# Patient Record
Sex: Female | Born: 1935
Health system: Southern US, Community
[De-identification: ages and names within clinical notes are randomized; demographics above are authoritative.]

## PROBLEM LIST (undated history)

## (undated) DIAGNOSIS — I10 Essential (primary) hypertension: Secondary | ICD-10-CM

## (undated) DIAGNOSIS — K589 Irritable bowel syndrome without diarrhea: Secondary | ICD-10-CM

## (undated) DIAGNOSIS — F419 Anxiety disorder, unspecified: Secondary | ICD-10-CM

## (undated) DIAGNOSIS — N816 Rectocele: Secondary | ICD-10-CM

## (undated) DIAGNOSIS — IMO0002 Reserved for concepts with insufficient information to code with codable children: Secondary | ICD-10-CM

## (undated) HISTORY — DX: Reserved for concepts with insufficient information to code with codable children: IMO0002

## (undated) HISTORY — PX: ABDOMINAL HYSTERECTOMY: SHX81

## (undated) HISTORY — PX: CHOLECYSTECTOMY: SHX55

## (undated) HISTORY — DX: Essential (primary) hypertension: I10

## (undated) HISTORY — DX: Anxiety disorder, unspecified: F41.9

## (undated) HISTORY — DX: Rectocele: N81.6

## (undated) HISTORY — DX: Irritable bowel syndrome, unspecified: K58.9

---

## 2006-02-15 ENCOUNTER — Ambulatory Visit: Payer: Self-pay | Admitting: Family Medicine

## 2006-05-07 ENCOUNTER — Ambulatory Visit: Payer: Self-pay | Admitting: Family Medicine

## 2006-05-07 DIAGNOSIS — I10 Essential (primary) hypertension: Secondary | ICD-10-CM | POA: Insufficient documentation

## 2006-05-07 DIAGNOSIS — F411 Generalized anxiety disorder: Secondary | ICD-10-CM | POA: Insufficient documentation

## 2006-07-12 ENCOUNTER — Ambulatory Visit: Payer: Self-pay | Admitting: Family Medicine

## 2006-07-12 DIAGNOSIS — K219 Gastro-esophageal reflux disease without esophagitis: Secondary | ICD-10-CM | POA: Insufficient documentation

## 2006-08-07 ENCOUNTER — Telehealth: Payer: Self-pay | Admitting: Family Medicine

## 2006-09-04 ENCOUNTER — Ambulatory Visit: Payer: Self-pay | Admitting: Family Medicine

## 2006-09-04 DIAGNOSIS — H8110 Benign paroxysmal vertigo, unspecified ear: Secondary | ICD-10-CM | POA: Insufficient documentation

## 2006-09-05 ENCOUNTER — Telehealth (INDEPENDENT_AMBULATORY_CARE_PROVIDER_SITE_OTHER): Payer: Self-pay | Admitting: *Deleted

## 2006-09-09 ENCOUNTER — Telehealth: Payer: Self-pay | Admitting: Family Medicine

## 2007-01-14 ENCOUNTER — Ambulatory Visit: Payer: Self-pay | Admitting: Family Medicine

## 2007-01-14 DIAGNOSIS — M25519 Pain in unspecified shoulder: Secondary | ICD-10-CM | POA: Insufficient documentation

## 2007-01-14 DIAGNOSIS — M810 Age-related osteoporosis without current pathological fracture: Secondary | ICD-10-CM | POA: Insufficient documentation

## 2007-01-27 ENCOUNTER — Encounter: Admission: RE | Admit: 2007-01-27 | Discharge: 2007-01-27 | Payer: Self-pay | Admitting: Family Medicine

## 2007-02-06 ENCOUNTER — Ambulatory Visit: Payer: Self-pay | Admitting: Family Medicine

## 2007-04-07 ENCOUNTER — Encounter (INDEPENDENT_AMBULATORY_CARE_PROVIDER_SITE_OTHER): Payer: Self-pay | Admitting: *Deleted

## 2007-05-06 ENCOUNTER — Ambulatory Visit: Payer: Self-pay | Admitting: Family Medicine

## 2007-05-23 ENCOUNTER — Ambulatory Visit: Payer: Self-pay | Admitting: Family Medicine

## 2007-05-27 ENCOUNTER — Ambulatory Visit: Payer: Self-pay | Admitting: Family Medicine

## 2007-08-08 ENCOUNTER — Encounter: Payer: Self-pay | Admitting: Family Medicine

## 2007-10-02 ENCOUNTER — Ambulatory Visit: Payer: Self-pay | Admitting: Family Medicine

## 2007-10-02 DIAGNOSIS — R300 Dysuria: Secondary | ICD-10-CM | POA: Insufficient documentation

## 2007-10-02 LAB — CONVERTED CEMR LAB
Nitrite: NEGATIVE
Protein, U semiquant: NEGATIVE

## 2007-10-23 ENCOUNTER — Telehealth: Payer: Self-pay | Admitting: Family Medicine

## 2008-02-12 ENCOUNTER — Telehealth: Payer: Self-pay | Admitting: Family Medicine

## 2008-02-12 ENCOUNTER — Ambulatory Visit: Payer: Self-pay | Admitting: Family Medicine

## 2008-02-12 ENCOUNTER — Encounter: Admission: RE | Admit: 2008-02-12 | Discharge: 2008-02-12 | Payer: Self-pay | Admitting: Family Medicine

## 2008-03-15 ENCOUNTER — Ambulatory Visit (HOSPITAL_COMMUNITY): Payer: Self-pay | Admitting: Psychiatry

## 2008-03-22 ENCOUNTER — Ambulatory Visit (HOSPITAL_COMMUNITY): Payer: Self-pay | Admitting: Psychiatry

## 2008-03-30 ENCOUNTER — Ambulatory Visit (HOSPITAL_COMMUNITY): Payer: Self-pay | Admitting: Psychiatry

## 2008-04-09 ENCOUNTER — Ambulatory Visit (HOSPITAL_COMMUNITY): Payer: Self-pay | Admitting: Psychiatry

## 2008-04-20 ENCOUNTER — Ambulatory Visit (HOSPITAL_COMMUNITY): Payer: Self-pay | Admitting: Psychiatry

## 2008-04-27 ENCOUNTER — Ambulatory Visit (HOSPITAL_COMMUNITY): Payer: Self-pay | Admitting: Psychiatry

## 2008-05-04 ENCOUNTER — Ambulatory Visit (HOSPITAL_COMMUNITY): Payer: Self-pay | Admitting: Psychiatry

## 2008-05-25 ENCOUNTER — Ambulatory Visit: Payer: Self-pay | Admitting: Family Medicine

## 2008-05-30 ENCOUNTER — Emergency Department (HOSPITAL_BASED_OUTPATIENT_CLINIC_OR_DEPARTMENT_OTHER): Admission: EM | Admit: 2008-05-30 | Discharge: 2008-05-31 | Payer: Self-pay | Admitting: Emergency Medicine

## 2008-05-30 ENCOUNTER — Ambulatory Visit: Payer: Self-pay | Admitting: Diagnostic Radiology

## 2008-05-31 ENCOUNTER — Telehealth: Payer: Self-pay | Admitting: Family Medicine

## 2008-05-31 DIAGNOSIS — K863 Pseudocyst of pancreas: Secondary | ICD-10-CM

## 2008-05-31 DIAGNOSIS — K862 Cyst of pancreas: Secondary | ICD-10-CM | POA: Insufficient documentation

## 2008-06-02 ENCOUNTER — Ambulatory Visit: Payer: Self-pay | Admitting: Family Medicine

## 2008-06-08 ENCOUNTER — Ambulatory Visit (HOSPITAL_COMMUNITY): Payer: Self-pay | Admitting: Psychiatry

## 2008-06-15 ENCOUNTER — Ambulatory Visit (HOSPITAL_COMMUNITY): Payer: Self-pay | Admitting: Psychiatry

## 2008-06-29 ENCOUNTER — Ambulatory Visit (HOSPITAL_COMMUNITY): Payer: Self-pay | Admitting: Psychiatry

## 2008-06-30 ENCOUNTER — Ambulatory Visit: Payer: Self-pay | Admitting: Family Medicine

## 2008-06-30 DIAGNOSIS — K589 Irritable bowel syndrome without diarrhea: Secondary | ICD-10-CM | POA: Insufficient documentation

## 2008-09-30 ENCOUNTER — Telehealth (INDEPENDENT_AMBULATORY_CARE_PROVIDER_SITE_OTHER): Payer: Self-pay | Admitting: *Deleted

## 2008-10-01 ENCOUNTER — Ambulatory Visit: Payer: Self-pay | Admitting: Family Medicine

## 2008-10-01 DIAGNOSIS — J309 Allergic rhinitis, unspecified: Secondary | ICD-10-CM | POA: Insufficient documentation

## 2008-12-30 ENCOUNTER — Telehealth: Payer: Self-pay | Admitting: Family Medicine

## 2009-02-15 ENCOUNTER — Ambulatory Visit: Payer: Self-pay | Admitting: Family Medicine

## 2009-03-31 ENCOUNTER — Ambulatory Visit: Payer: Self-pay | Admitting: Family Medicine

## 2009-05-16 ENCOUNTER — Ambulatory Visit: Payer: Self-pay | Admitting: Family Medicine

## 2009-05-18 ENCOUNTER — Ambulatory Visit: Payer: Self-pay | Admitting: Family Medicine

## 2009-07-01 ENCOUNTER — Telehealth: Payer: Self-pay | Admitting: Family Medicine

## 2009-07-26 ENCOUNTER — Ambulatory Visit: Payer: Self-pay | Admitting: Family Medicine

## 2009-08-17 ENCOUNTER — Encounter: Payer: Self-pay | Admitting: Family Medicine

## 2009-11-23 ENCOUNTER — Telehealth: Payer: Self-pay | Admitting: Family Medicine

## 2009-12-20 ENCOUNTER — Telehealth: Payer: Self-pay | Admitting: Family Medicine

## 2010-01-10 ENCOUNTER — Ambulatory Visit: Payer: Self-pay | Admitting: Family Medicine

## 2010-01-24 ENCOUNTER — Telehealth: Payer: Self-pay | Admitting: Family Medicine

## 2010-05-04 ENCOUNTER — Ambulatory Visit: Payer: Self-pay | Admitting: Family Medicine

## 2010-05-04 ENCOUNTER — Encounter: Admission: RE | Admit: 2010-05-04 | Discharge: 2010-05-04 | Payer: Self-pay | Admitting: Family Medicine

## 2010-05-04 DIAGNOSIS — M5430 Sciatica, unspecified side: Secondary | ICD-10-CM | POA: Insufficient documentation

## 2010-05-16 ENCOUNTER — Encounter: Payer: Self-pay | Admitting: Family Medicine

## 2010-05-18 ENCOUNTER — Telehealth: Payer: Self-pay | Admitting: Family Medicine

## 2010-05-19 ENCOUNTER — Ambulatory Visit: Payer: Self-pay | Admitting: Family Medicine

## 2010-05-22 ENCOUNTER — Encounter
Admission: RE | Admit: 2010-05-22 | Discharge: 2010-06-15 | Payer: Self-pay | Source: Home / Self Care | Attending: Family Medicine | Admitting: Family Medicine

## 2010-06-23 ENCOUNTER — Ambulatory Visit
Admission: RE | Admit: 2010-06-23 | Discharge: 2010-06-23 | Payer: Self-pay | Source: Home / Self Care | Attending: Family Medicine | Admitting: Family Medicine

## 2010-06-23 ENCOUNTER — Encounter: Payer: Self-pay | Admitting: Family Medicine

## 2010-06-24 LAB — CONVERTED CEMR LAB
ALT: 14 units/L (ref 0–35)
Albumin: 4.5 g/dL (ref 3.5–5.2)
BUN: 19 mg/dL (ref 6–23)
CO2: 25 meq/L (ref 19–32)
Calcium: 9.2 mg/dL (ref 8.4–10.5)
Chloride: 106 meq/L (ref 96–112)
Creatinine, Ser: 1.15 mg/dL (ref 0.40–1.20)
Potassium: 4.8 meq/L (ref 3.5–5.3)

## 2010-07-18 NOTE — Assessment & Plan Note (Signed)
Summary: KNEE PAIN/LINGERING COUGH   Vital Signs:  Patient profile:   77 year old female Height:      68 inches Weight:      164.75 pounds BMI:     25.14 Temp:     98.5 degrees F oral Pulse rate:   81 / minute BP sitting:   185 / 92  Vitals Entered By: Kandice Hams (July 26, 2009 10:52 AM) CC: C/O RIGHT KNEE PAIN, KNOT.   WANT DOG BITE CHECKED,   Primary Care Provider:  [name] MD  CC:  C/O RIGHT KNEE PAIN, KNOT.   WANT DOG BITE CHECKED, and .  History of Present Illness: C/O RIGHT KNEE PAIN, KNOT.   WANT DOG BITE CHECKED.  Says feels a hard tissue under teh skin.Still a little tender but no drainage or pain currently.    Right knee painful for one week . Thinks it was swollen. It is acutally a little better today. Not taking any pain relievers or using any creams for it.  Will occ use IBU for her joints.  Helps some. No trauma. Was very painful to walk the first couple of days. Better wtih rest. Has been alble to walk well on it last 2 days.  Feels the swelling has improved as well.   BP at home this AM.  136/69.  Still hav occ cough s/p URI.  No SOB or wheezing.  No fever.  Started after cold. Mostly dry cough occ productive.     Wants to know if can exercise.    Allergies: No Known Drug Allergies  Social History: Reviewed history from 06/02/2008 and no changes required. 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 Lives with son Gelene Mink.  Has 3 adult children.  Never smoked, no EtOH, no drugs, no Caff, no reg exercise.  Physical Exam  General:  Well-developed,well-nourished,in no acute distress; alert,appropriate and cooperative throughout examination Head:  Normocephalic and atraumatic without obvious abnormalities. No apparent alopecia or balding. Eyes:  No corneal or conjunctival inflammation noted. EOMI. Perrla. Ears:  External ear exam shows no significant lesions or deformities.  Otoscopic examination reveals clear canals, tympanic membranes are intact  bilaterally without bulging, retraction, inflammation or discharge. Hearing is grossly normal bilaterally. Nose:  External nasal examination shows no deformity or inflammation. Nasal mucosa are pink and moist without lesions or exudates. Mouth:  Oral mucosa and oropharynx without lesions or exudates.  Teeth in good repair. Neck:  No deformities, masses, or tenderness noted. Lungs:  Normal respiratory effort, chest expands symmetrically. Lungs are clear to auscultation, no crackles or wheezes. Heart:  Normal rate and regular rhythm. S1 and S2 normal without gallop, murmur, click, rub or other extra sounds. Msk:  Right knee with trace edema. No crepitus. Normal ROM. no laxity.  Nontender. No bruising or trauma.  Neg McMurrays.  KNee and andles strength 5/5. No LE edema.  Extremities:  On her lower right leg the dog bite is healing well. still some scab and firm tissue which is likely scar tissue.    Impression & Recommendations:  Problem # 1:  KNEE PAIN, RIGHT (ICD-719.46) Assessment New Discussed that her exam is normal excpet for some mild swelling. With no trauma likely related to her OA. Tylenol arthritis for pain. Call if not continuing to improve ot gets worse and at that time will order xray. Elevated knee if swells.    Problem # 2:  DOG BITE (ICD-E906.0) Assessment: Improved Healing well. Some scar tissue.   Problem # 3:  COUGH (ICD-786.2) Assessment: New Exam is normal today. nO signs of infection. Likely post viral cough. If not better in 2-3 weeks will get a CXR.   Complete Medication List: 1)  Alprazolam 0.25 Mg Tabs (Alprazolam) .... One by mouth one- two times daily as needed for anxiety attacks 2)  Nexium 40 Mg Cpdr (Esomeprazole magnesium) .... Take 1 tablet by mouth once a day 20 min before breakfast 3)  Proventil Hfa 108 (90 Base) Mcg/act Aers (Albuterol sulfate) .... 4 puffs inhaled every 4 hours as needed for shortness of breath 4)  Toprol Xl 25 Mg Xr24h-tab (Metoprolol  succinate) .... Take 1 tablet by mouth once a day

## 2010-07-18 NOTE — Progress Notes (Signed)
Summary: Refill Xanax  Phone Note Call from Patient Call back at Home Phone [phone]   Caller: Patient Call For: [name] MD Summary of Call: Pt calls and wanted to know if could get a refill on her Xanax- had to take 2 a day with the dog bite and brother died during Christmas- normally she says she just takes one a day. Uses [facility] [address] Initial call taken by: Kathlene November,  July 01, 2009 8:16 AM    Prescriptions: ALPRAZOLAM 0.25 MG TABS (ALPRAZOLAM) one by mouth one- two times daily as needed for anxiety attacks  #45 x 1   Entered and Authorized by:   [name] MD   Signed by:   [name] MD on 07/01/2009   Method used:   Printed then faxed to ...       [facility] [address] (retail)       [address]       [place], [place]  [address]       Ph: [phone] or [phone]       Fax: [phone]   RxID:   [id number]

## 2010-07-18 NOTE — Assessment & Plan Note (Signed)
Summary: Sciatic, HTN   Vital Signs:  Patient profile:   77 year old female Height:      68 inches Weight:      166 pounds Pulse rate:   93 / minute BP sitting:   166 / 84  (right arm) Cuff size:   regular  Vitals Entered By: Avon Gully CMA, Duncan Dull) (May 04, 2010 9:06 AM) CC: pain in rt leg x 2 weeks, pain radiated to buttocks into lower back only on rt side   Primary Care Provider:  [name] MD  CC:  pain in rt leg x 2 weeks and pain radiated to buttocks into lower back only on rt side.  History of Present Illness: Burning sensation started on her right outer lower leg. Started about 2-3 weeks ago. Says would feel like it was hot. No redness or swelling. Then migrated up to her outer thigh. Now in her buttock and low back.  Occ uses a cane. Says the pain was so severe for a couple of days she was almost in tears. Has been a little better last couple of days. Did try Advil 2 tabs two times a day. then tried Tylenol arthritis and felt this took the edge off.  She has to travel next week and is worrried about this.   Current Medications (verified): 1)  Alprazolam 0.25 Mg Tabs (Alprazolam) .... One By Mouth One- Two Times Daily As Needed For Anxiety Attacks 2)  Nexium 40 Mg  Cpdr (Esomeprazole Magnesium) .... Take 1 Tablet By Mouth Once A Day 20 Min Before Breakfast  Allergies (verified): No Known Drug Allergies  Comments:  Nurse/Medical Assistant: The patient's medications and allergies were reviewed with the patient and were updated in the Medication and Allergy Lists. Avon Gully CMA, Duncan Dull) (May 04, 2010 9:07 AM)  Physical Exam  General:  Well-developed,well-nourished,in no acute distress; alert,appropriate and cooperative throughout examination Lungs:  Normal respiratory effort, chest expands symmetrically. Lungs are clear to auscultation, no crackles or wheezes. Heart:  Normal rate and regular rhythm. S1 and S2 normal without gallop, murmur,  click, rub or other extra sounds. Msk:  Righ hip with NROM but pain in her low back with full flexion. Hip abduction strength is normal.  Tender over the buttic area and the right great trochanger. No spine tenderness. Neg straight leg raise. Hip, knee, and ankle strength 5/5 bilat.  Pulses:  Radial 2+  Extremities:  NO LE edema.    Impression & Recommendations:  Problem # 1:  SCIATICA (ICD-724.3) BAsed on teh locatoin of her pain I think it is sciatica. It is interested that it startedin her lower leg and progessed up.  Will get an xray because of her age.   Conservative tx. Tylenol, streteches, rest, moist heat. Call if not better in 2 weeks.  Orders: T-DG Lumbar Spine 2-3 Views ([address]) T-DG Sacrum/Coccyx ([address])  Her updated medication list for this problem includes:    Tramadol Hcl 50 Mg Tabs (Tramadol hcl) .Marland Kitchen... Take 1 tablet by mouth three times a day as needed severe pain.  Problem # 2:  HYPERTENSION, WHITE COAT (ICD-796.2) Assessment: Deteriorated Restart a BP med. will avoid a diuretic.  F/U in 2 montsh to recheck. Can check Cr at that time.  Her updated medication list for this problem includes:    Losartan Potassium 50 Mg Tabs (Losartan potassium) .Marland Kitchen... Take 1 tablet by mouth once a day  BP today: 166/84 Prior BP: 168/80 (01/10/2010)  Instructed in low sodium diet (DASH  Handout) and behavior modification.    Complete Medication List: 1)  Alprazolam 0.25 Mg Tabs (Alprazolam) .... One by mouth one- two times daily as needed for anxiety attacks 2)  Nexium 40 Mg Cpdr (Esomeprazole magnesium) .... Take 1 tablet by mouth once a day 20 min before breakfast 3)  Tramadol Hcl 50 Mg Tabs (Tramadol hcl) .... Take 1 tablet by mouth three times a day as needed severe pain. 4)  Losartan Potassium 50 Mg Tabs (Losartan potassium) .... Take 1 tablet by mouth once a day  Patient Instructions: 1)  I think this is from your sciatic nerve.   2)  Can start the stretches when able.  3)  Use  your tylenol arthritis 4)  Can use the tramadol as needed as well for more severe pain. 5)  WE will call you with the xray results.  Prescriptions: ALPRAZOLAM 0.25 MG TABS (ALPRAZOLAM) one by mouth one- two times daily as needed for anxiety attacks  #60 x 1   Entered and Authorized by:   [name] MD   Signed by:   [name] MD on 05/04/2010   Method used:   Printed then faxed to ...       [facility] [address] (retail)       [address]       [place], [place]  [address]       Ph: [phone] or [phone]       Fax: [phone]   RxID:   [id number] LOSARTAN POTASSIUM 50 MG TABS (LOSARTAN POTASSIUM) Take 1 tablet by mouth once a day  #30 x 2   Entered and Authorized by:   [name] MD   Signed by:   [name] MD on 05/04/2010   Method used:   Electronically to        [facility] [address] (retail)       [address]       [place], [place]  [address]       Ph: [phone] or [phone]       Fax: [phone]   RxID:   [id number] TRAMADOL HCL 50 MG TABS (TRAMADOL HCL) Take 1 tablet by mouth three times a day as needed severe pain.  #45 x 0   Entered and Authorized by:   [name] MD   Signed by:   [name] MD on 05/04/2010   Method used:   Electronically to        [facility] [address] (retail)       [address]       [place], [place]  [address]       Ph: [phone] or [phone]       Fax: [phone]   RxID:   [id number]    Orders Added: 1)  T-DG Lumbar Spine 2-3 Views [[address]2100] 2)  T-DG Sacrum/Coccyx [722[address]] 3)  Est. Patient Level IV [[address]]

## 2010-07-18 NOTE — Progress Notes (Signed)
Summary: dizziness  Phone Note Call from Patient   Caller: Patient Call For: [name] MD Summary of Call: FYI: pt had a sudden onset of dizzieness yesterday. Has a hx of vertigo. Took dramamine and doesnt feel as dizzy this am. Told her to to wait to see how she feel today, and if comes back or pt starts to feel worse then to call us back. pt voiced understanding Initial call taken by: Avon Gully CMA, Duncan Dull),  January 24, 2010 8:15 AM

## 2010-07-18 NOTE — Assessment & Plan Note (Signed)
Summary: cough and mouth sores   Vital Signs:  Patient profile:   77 year old female Height:      68 inches Weight:      162 pounds Pulse rate:   80 / minute BP sitting:   168 / 80  (left arm) Cuff size:   regular  Vitals Entered By: Avon Gully CMA, Duncan Dull) (January 10, 2010 11:40 AM) CC: cough,mouth sores x 16 days   Primary Care Provider:  [name] MD  CC:  cough and mouth sores x 16 days.  History of Present Illness: Cough for about 16 days. Has tried Robitussin and helped some Tried the Halls cough drops.  Went to [employer]. no SOB or chest symptoms. Feels like it is in her "throat".  2 days after stopped the cough drops got ulcers in her mouth.  Feels it is getting better. Gums were tender.  Tea would sooth the lesions.  Did try the Advil as well.  No nasal congestion. Some ear pressure. No fever or HA.     BP at home 129/69 this AM.  SHe has white coat hypertension.   Current Medications (verified): 1)  Alprazolam 0.25 Mg Tabs (Alprazolam) .... One By Mouth One- Two Times Daily As Needed For Anxiety Attacks 2)  Nexium 40 Mg  Cpdr (Esomeprazole Magnesium) .... Take 1 Tablet By Mouth Once A Day 20 Min Before Breakfast  Allergies (verified): No Known Drug Allergies  Comments:  Nurse/Medical Assistant: Avon Gully CMA, Duncan Dull) (January 10, 2010 11:41 AM) The patient's medications and allergies were reviewed with the patient and were updated in the Medication and Allergy Lists. Avon Gully CMA, Duncan Dull) (January 10, 2010 11:41 AM)  Physical Exam  General:  Well-developed,well-nourished,in no acute distress; alert,appropriate and cooperative throughout examination Head:  Normocephalic and atraumatic without obvious abnormalities. No apparent alopecia or balding. Eyes:  No corneal or conjunctival inflammation noted. EOMI.  Ears:  Bilat canals blocked with cerumen.  Nose:  no external deformity.   Mouth:  Oral mucosa and oropharynx without lesions or  exudates.  Teeth in good repair. No ulcerations onthe tongue toay.  Lungs:  Normal respiratory effort, chest expands symmetrically. Lungs are clear to auscultation, no crackles or wheezes. Heart:  Normal rate and regular rhythm. S1 and S2 normal without gallop, murmur, click, rub or other extra sounds. Skin:  no rashes.   Cervical Nodes:  No lymphadenopathy noted Psych:  Cognition and judgment appear intact. Alert and cooperative with normal attention span and concentration. No apparent delusions, illusions, hallucinations   Impression & Recommendations:  Problem # 1:  ALLERGIC RHINITIS CAUSE UNSPECIFIED (ICD-477.9) I think this is the cause of her ST, cough, and drainage. If not better in 5 days then let us now.   Problem # 2:  ANXIETY DISORDER, GENERALIZED (ICD-300.02) Overall doing well but takes one hal in the late AM adn then late PM adn then one in the evening and this really seems to work for her and control her sxs.  Increase tabs to 60 per month.  Her updated medication list for this problem includes:    Alprazolam 0.25 Mg Tabs (Alprazolam) ..... One by mouth one- two times daily as needed for anxiety attacks  Problem # 3:  HYPERTENSION, BENIGN ESSENTIAL (ICD-401.1)  The following medications were removed from the medication list:    Toprol Xl 25 Mg Xr24h-tab (Metoprolol succinate) .Marland Kitchen... Take 1 tablet by mouth once a day  Complete Medication List: 1)  Alprazolam 0.25 Mg Tabs (  Alprazolam) .... One by mouth one- two times daily as needed for anxiety attacks 2)  Nexium 40 Mg Cpdr (Esomeprazole magnesium) .... Take 1 tablet by mouth once a day 20 min before breakfast  Patient Instructions: 1)  Lodrane one a day. If not too drying then can increase to 2 tabs daily.  2)  Start the flonase 1 spray in each nostril once a day. 3)   If not better by the end of the week then let me know.  4)  I sent a new prescription to the pharmacy for your anxiety medication.   Prescriptions: ALPRAZOLAM 0.25 MG TABS (ALPRAZOLAM) one by mouth one- two times daily as needed for anxiety attacks  #60 x 1   Entered and Authorized by:   [name] MD   Signed by:   [name] MD on 01/10/2010   Method used:   Printed then faxed to ...       [facility] [address] (retail)       [address]       [place], [place]  [address]       Ph: [phone] or [phone]       Fax: [phone]   RxID:   [id number]

## 2010-07-18 NOTE — Progress Notes (Signed)
Summary: refill alprazolam  Phone Note Refill Request Message from:  Fax from Pharmacy on December 20, 2009 3:49 PM  Refills Requested: Medication #1:  ALPRAZOLAM 0.25 MG TABS one by mouth one- two times daily as needed for anxiety attacks   Last Refilled: 11/23/2009 [facility]     fax [phone]   Method Requested: Fax to Local Pharmacy Initial call taken by: Duard Brady LPN,  December 21, 4538 3:49 PM    Prescriptions: ALPRAZOLAM 0.25 MG TABS (ALPRAZOLAM) one by mouth one- two times daily as needed for anxiety attacks  #45 x 0   Entered and Authorized by:   Seymour Bars DO   Signed by:   Seymour Bars DO on 12/20/2009   Method used:   Printed then faxed to ...       [facility] [address] (retail)       [address]       [place], [place]  [address]       Ph: [phone] or [phone]       Fax: [phone]   RxID:   [id number]

## 2010-07-18 NOTE — Medication Information (Signed)
Summary: Denial for Alprazolam/[facility]  Denial for Alprazolam/[facility]   Imported By: Lanelle Bal 08/23/2009 08:53:06  _____________________________________________________________________  External Attachment:    Type:   Image     Comment:   External Document

## 2010-07-18 NOTE — Assessment & Plan Note (Signed)
Summary: hypertension, sciatica   Vital Signs:  Patient profile:   77 year old female Height:      68 inches Weight:      165 pounds Pulse rate:   67 / minute BP sitting:   185 / 85  (right arm) Cuff size:   regular  Vitals Entered By: Avon Gully CMA, Duncan Dull) (May 19, 2010 9:48 AM)  Serial Vital Signs/Assessments:  Time      Position  BP       Pulse  Resp  Temp     By 9:49 AM             179/92                         Avon Gully CMA, (AAMA)  CC: BP fluctuating   Primary Care Briyonna Omara:  [name] MD  CC:  BP fluctuating.  History of Present Illness: Home BPs have ben running in the upper 140s to the 180s. she does look her blood pressure is bouncing up and down.  She did bring her home blood pressure machine to compare our machine.  The second reading under the vital signs is the reading on her home blood pressure monitor today.  Only one reading in the 140s on the memory of her BP machine. No CP or SOB.    Still having pain in her right leg into her right low back. Overall better than it was 2-3 weeks ago but still bothering her. We have have referred her for PT. her appointment is on Monday  Allergies: No Known Drug Allergies  Physical Exam  General:  Well-developed,well-nourished,in no acute distress; alert,appropriate and cooperative throughout examination Head:  Normocephalic and atraumatic without obvious abnormalities. No apparent alopecia or balding. Lungs:  Normal respiratory effort, chest expands symmetrically. Lungs are clear to auscultation, no crackles or wheezes. Heart:  Normal rate and regular rhythm. S1 and S2 normal without gallop, murmur, click, rub or other extra sounds. Pulses:  Radial 2+ Extremities:   node lower extremity edema Skin:  no rashes.   Cervical Nodes:  No lymphadenopathy noted Psych:  Cognition and judgment appear intact. Alert and cooperative with normal attention span and concentration. No apparent delusions,  illusions, hallucinations   Impression & Recommendations:  Problem # 1:  HYPERTENSION, WHITE COAT (ICD-796.2) we have really gone back and forth with her she has two hypertension whitecoat syndrome.  Had previous the chart and taking losartan regularly.  Now that she is brought in her home blood pressure cuff and have looked through the numbers in the machine I truly believe that she has high blood pressure.  We discussed that yes stress and anxiety can be contributing factors but if her pressure is persistently elevated it needs to be treated.  We will increase her losartan to 100 mg today.  Follow-up in one month for recheck and for  BMP at that time Her updated medication list for this problem includes:    Losartan Potassium 100 Mg Tabs (Losartan potassium) .Marland Kitchen... Take 1 tablet by mouth once a day  Problem # 2:  SCIATICA (ICD-724.3) she is significantly better but still having a fair amount of residual symptoms.  She is still limping with her today.  I'm sending her to physical therapy.  She does not have significant improvement of the next 3 or 4 weeks our refer her to orthopedics for further evaluation Her updated medication list for this problem includes:  Tramadol Hcl 50 Mg Tabs (Tramadol hcl) .Marland Kitchen... Take 1 tablet by mouth three times a day as needed severe pain.  Complete Medication List: 1)  Alprazolam 0.25 Mg Tabs (Alprazolam) .... One by mouth one- two times daily as needed for anxiety attacks 2)  Nexium 40 Mg Cpdr (Esomeprazole magnesium) .... Take 1 tablet by mouth once a day 20 min before breakfast 3)  Tramadol Hcl 50 Mg Tabs (Tramadol hcl) .... Take 1 tablet by mouth three times a day as needed severe pain. 4)  Losartan Potassium 100 Mg Tabs (Losartan potassium) .... Take 1 tablet by mouth once a day  Patient Instructions: 1)  Please schedule a follow-up appointment in 1 month for blood pressure.  2)  Can take 2 of the losartan tab for a total of 100mg . When run out can go pick  up the new rx at the pharmacy.  Prescriptions: LOSARTAN POTASSIUM 100 MG TABS (LOSARTAN POTASSIUM) Take 1 tablet by mouth once a day  #30 x 0   Entered and Authorized by:   [name] MD   Signed by:   [name] MD on 05/19/2010   Method used:   Electronically to        [facility] [address] (retail)       [address]       [place], [place]  [address]       Ph: [phone] or [phone]       Fax: [phone]   RxID:   [id number]    Orders Added: 1)  Est. Patient Level III [[address]]  Appended Document: hypertension, sciatica

## 2010-07-18 NOTE — Progress Notes (Signed)
Summary: Sciatica pain  Phone Note Call from Patient Call back at Home Phone [phone]   Caller: Patient Call For: [name] MD Summary of Call: Pt calls and states her sciatica is feeling better but still favors the leg. Not walking with cane anymore just being very careful. Not completely gone and wonders what she should do- just give it more time or does something else need to be done Initial call taken by: Kathlene November LPN,  May 18, 2010 8:10 AM  Follow-up for Phone Call        Lets do physical therapy.  Follow-up by: [name] MD,  May 18, 2010 8:10 AM     Appended Document: Sciatica pain 05/18/2010- Pt notified and order sent to rehab for appt to be scheduled. KJ LPN

## 2010-07-18 NOTE — Progress Notes (Signed)
Summary: Early Xanax refill  Phone Note Refill Request   Refills Requested: Medication #1:  ALPRAZOLAM 0.25 MG TABS one by mouth one- two times daily as needed for anxiety attacks Initial call taken by: Payton Spark CMA,  November 23, 2009 4:39 PM Caller: Patient Summary of Call: Pt called requesting refill on Xanax. Pt is aware that based on sig she is early but she has had more panic attacks and has needed to take every night to sleep. Pt is out of med and going to [employer]. Pt states she thinks she needs quantity increased bc she has tried counseling and other meds but nothing helps panic attacks like the xanax.   Follow-up for Phone Call        I reviewed Dr Shelah Lewandowsky notes and her last OV for anxiety was 01-2009 and they talked about other treatments.  I will fill her RX so that she does not run out by I am NOT increasing the dosage frequency.  If she feels she needs more than on the RX she HAS to start an SSRI. Follow-up by: Seymour Bars DO,  November 23, 2009 4:49 PM    Prescriptions: ALPRAZOLAM 0.25 MG TABS (ALPRAZOLAM) one by mouth one- two times daily as needed for anxiety attacks  #45 x 0   Entered and Authorized by:   Seymour Bars DO   Signed by:   Seymour Bars DO on 11/23/2009   Method used:   Printed then faxed to ...       [facility] [address] (retail)       [address]       [place], [place]  [address]       Ph: [phone] or [phone]       Fax: [phone]   RxID:   [id number]   Appended Document: Early Xanax refill Pt aware

## 2010-07-20 NOTE — Assessment & Plan Note (Signed)
Summary: HTN, sciatica   Vital Signs:  Patient profile:   77 year old female Height:      68 inches Weight:      165 pounds Pulse rate:   114 / minute BP sitting:   144 / 77  (left arm) Cuff size:   regular  Vitals Entered By: Avon Gully CMA, Duncan Dull) (June 23, 2010 9:04 AM) CC: f/u BP, pt Bp was127/66 this am   Primary Care Provider:  [name] MD  CC:  f/u BP and pt Bp was127/66 this am.  History of Present Illness: f/u BP, pt BP was127/66 this am.  Cough is much much better. She says she is able to drink orange uice now.   Leg pain is better but still occ pain  in her low back. Still doing her home exercises.  She is trying to get into pool exercises.  HAs completed PT. Syas it was extremely helpful. She is able to walk without assistance now.   Current Medications (verified): 1)  Alprazolam 0.25 Mg Tabs (Alprazolam) .... One By Mouth One- Two Times Daily As Needed For Anxiety Attacks 2)  Nexium 40 Mg  Cpdr (Esomeprazole Magnesium) .... Take 1 Tablet By Mouth Once A Day 20 Min Before Breakfast 3)  Tramadol Hcl 50 Mg Tabs (Tramadol Hcl) .... Take 1 Tablet By Mouth Three Times A Day As Needed Severe Pain. 4)  Losartan Potassium 100 Mg Tabs (Losartan Potassium) .... Take 1 Tablet By Mouth Once A Day  Allergies (verified): No Known Drug Allergies  Comments:  Nurse/Medical Assistant: The patient's medications and allergies were reviewed with the patient and were updated in the Medication and Allergy Lists. Avon Gully CMA, Duncan Dull) (June 23, 2010 9:05 AM)  Physical Exam  General:  Well-developed,well-nourished,in no acute distress; alert,appropriate and cooperative throughout examination Lungs:  Normal respiratory effort, chest expands symmetrically. Lungs are clear to auscultation, no crackles or wheezes. Heart:  Normal rate and regular rhythm. S1 and S2 normal without gallop, murmur, click, rub or other extra sounds.   Impression &  Recommendations:  Problem # 1:  HYPERTENSION, BENIGN (ICD-401.1)  Well controlled. F/U in 4 months. Recheck Cr and potassium.  Her updated medication list for this problem includes:    Losartan Potassium 100 Mg Tabs (Losartan potassium) .Marland Kitchen... Take 1 tablet by mouth once a day  BP today: 144/77 Prior BP: 185/85 (05/19/2010)  Orders: T-Comprehensive Metabolic Panel ([id number])  Problem # 2:  SCIATICA (ICD-724.3) Much improved. Encouraged her to joni silver Sneakers at the [facility] to get into some water aeorbics classes.  Her updated medication list for this problem includes:    Tramadol Hcl 50 Mg Tabs (Tramadol hcl) .Marland Kitchen... Take 1 tablet by mouth three times a day as needed severe pain.  Complete Medication List: 1)  Alprazolam 0.25 Mg Tabs (Alprazolam) .... One by mouth one- two times daily as needed for anxiety attacks 2)  Nexium 40 Mg Cpdr (Esomeprazole magnesium) .... Take 1 tablet by mouth once a day 20 min before breakfast 3)  Tramadol Hcl 50 Mg Tabs (Tramadol hcl) .... Take 1 tablet by mouth three times a day as needed severe pain. 4)  Losartan Potassium 100 Mg Tabs (Losartan potassium) .... Take 1 tablet by mouth once a day  Patient Instructions: 1)  Please schedule a follow-up appointment in 4 months for blood pressure.  Prescriptions: LOSARTAN POTASSIUM 100 MG TABS (LOSARTAN POTASSIUM) Take 1 tablet by mouth once a day  #30 x 6  Entered and Authorized by:   [name] MD   Signed by:   [name] MD on 06/23/2010   Method used:   Electronically to        [facility] [address] (retail)       [address]       [place], [place]  [address]       Ph: [phone] or [phone]       Fax: [phone]   RxID:   [id number]    Orders Added: 1)  T-Comprehensive Metabolic Panel [[id number]] 2)  Est. Patient Level III [63016]

## 2010-07-20 NOTE — Miscellaneous (Signed)
Summary: PT Discharge/[facility]  PT Discharge/[facility]   Imported By: Lanelle Bal 07/12/2010 11:43:15  _____________________________________________________________________  External Attachment:    Type:   Image     Comment:   External Document

## 2010-08-31 ENCOUNTER — Telehealth: Payer: Self-pay | Admitting: Family Medicine

## 2010-09-05 NOTE — Progress Notes (Signed)
  Phone Note Refill Request Message from:  Patient on August 31, 2010 9:34 AM  Refills Requested: Medication #1:  ALPRAZOLAM 0.25 MG TABS one by mouth one- two times daily as needed for anxiety attacks Initial call taken by: Avon Gully CMA, Duncan Dull),  August 31, 2010 9:34 AM    Prescriptions: ALPRAZOLAM 0.25 MG TABS (ALPRAZOLAM) one by mouth one- two times daily as needed for anxiety attacks  #60 x 1   Entered by:   Avon Gully CMA, (AAMA)   Authorized by:   [name] MD   Signed by:   Avon Gully CMA, (AAMA) on 08/31/2010   Method used:   Printed then faxed to ...       [facility] [address] (retail)       [address]       [place], [place]  [address]       Ph: [phone] or [phone]       Fax: [phone]   RxID:   [id number]

## 2010-10-17 ENCOUNTER — Other Ambulatory Visit: Payer: Self-pay | Admitting: Family Medicine

## 2010-10-21 ENCOUNTER — Other Ambulatory Visit: Payer: Self-pay | Admitting: Family Medicine

## 2010-10-23 ENCOUNTER — Encounter: Payer: Self-pay | Admitting: Family Medicine

## 2010-10-23 ENCOUNTER — Ambulatory Visit (INDEPENDENT_AMBULATORY_CARE_PROVIDER_SITE_OTHER): Payer: Medicare Other | Admitting: Family Medicine

## 2010-10-23 DIAGNOSIS — I10 Essential (primary) hypertension: Secondary | ICD-10-CM

## 2010-10-23 DIAGNOSIS — F411 Generalized anxiety disorder: Secondary | ICD-10-CM

## 2010-10-23 DIAGNOSIS — K589 Irritable bowel syndrome without diarrhea: Secondary | ICD-10-CM

## 2010-10-23 MED ORDER — HYOSCYAMINE SULFATE ER 0.375 MG PO TB12: 0.3750 mg | ORAL_TABLET | Freq: Two times a day (BID) | ORAL | Status: DC | PRN

## 2010-10-23 MED ORDER — ALPRAZOLAM 0.25 MG PO TABS: 0.2500 mg | ORAL_TABLET | Freq: Three times a day (TID) | ORAL | Status: DC | PRN

## 2010-10-23 NOTE — Assessment & Plan Note (Signed)
Her blood pressure is significantly elevated today but I'm really beginning to suspect that she does have whitecoat hypertension. As her blood pressure machine and it read high today during her office visit but the other blood pressures recorded the machine really looks overall quite for an 77 year old female. At this point I'm not going to adjust her medications, see her back in a month and have her bring in her home readings again. She is also somewhat tearful and nervous today in the office because of some recent social stressors and this may be elevating her blood pressure as well.

## 2010-10-23 NOTE — Assessment & Plan Note (Signed)
She is very frustrated with her IBS. I discussed with her that we can consider a trial of Levbid if she would like. It is taken twice daily. I explained this is not a cure and it would not completely rid her of her symptoms but certainly if it provides some relief or she can feel more at ease to leave her home this could be very helpful for her. I did explain potential side effects such as dry mouth et Karie Soda.

## 2010-10-23 NOTE — Assessment & Plan Note (Signed)
She has been out of her Xanax which she takes daily. I then overreamed for for this. This also might explain why her blood pressures running a little bit higher today. She's had a lot of social stressors recently, but things seem to be settling down.

## 2010-10-23 NOTE — Progress Notes (Signed)
  Subjective:    Patient ID: Tonya [name], female    DOB: 1926-10-19, 77 y.o.   MRN: [id number]  HPI Took 5 months for her right knee to heal. Her brother died in 2022/10/07. Her IBS really flared after this.  Went to see [facility] and put her on restora, which is a probiotic..  They felt it was her IBS and she was started on restora (probiotic) and metronidazole. Says started to get a whiet coating on her tongue so after 10 days she stopped the medication. Stools have been mucousy and runny. She has completed the metronidazole. Says it really made her nauseated.  She has now restarted the probiotic and was told to take it for 21 days. She is just very frustrated with her IBS and uncinate there is anything else that she can do.  Her BP machine read 173/88 compared to ours.  The other blood pressures on her machine and she is taking from home tend to range in the 130 to 140 range and is on systolic. No chest pain or shortness of breath. She did not feel dizzy today. Scissors it is disappointed at her home blood pressures have looked great.  Review of Systems     Objective:   Physical Exam  Constitutional: She is oriented to person, place, and time. She appears well-developed and well-nourished.  HENT:  Head: Normocephalic and atraumatic.  Eyes: Pupils are equal, round, and reactive to light.  Cardiovascular: Normal rate, regular rhythm and normal heart sounds.   Pulmonary/Chest: Effort normal and breath sounds normal.  Neurological: She is alert and oriented to person, place, and time.  Skin: Skin is warm and dry.  Psychiatric: She has a normal mood and affect.          Assessment & Plan:

## 2010-11-09 ENCOUNTER — Telehealth: Payer: Self-pay | Admitting: Family Medicine

## 2010-11-09 MED ORDER — DIPHENOXYLATE-ATROPINE 2.5-0.025 MG PO TABS: 1.0000 | ORAL_TABLET | Freq: Four times a day (QID) | ORAL | Status: AC | PRN

## 2010-11-09 NOTE — Telephone Encounter (Signed)
Pt calling and stating diarrhea with IBS.  Has had probiotic and using kaeopectate and not helping.  Diarrhea since last Monday.  She did travel last week and she states that it usually will activate the IBS.  Hurts straight across the stomach.  #4-5/10.  Having up to 10 diarrheal stools with form a day.  Using alprazolam more often than usual to try to get the stomach calmed down.  Only had 3 saltine crackers today.  Would you please advise other things we can do.  We talked about a bland diet with fiber. Plan:  Routed to Dr. Marlyne Beards, LPN Domingo Dimes

## 2010-11-09 NOTE — Telephone Encounter (Signed)
We can call in some lOmotil.

## 2010-11-09 NOTE — Telephone Encounter (Signed)
Lomotil faxed since has control substance in it to [facility] for the pt per Dr. Linford Arnold order.  Pt informed and IBS instructions given. Jarvis Newcomer, LPN Domingo Dimes

## 2010-11-19 ENCOUNTER — Encounter: Payer: Self-pay | Admitting: Family Medicine

## 2010-11-20 ENCOUNTER — Encounter: Payer: Self-pay | Admitting: Family Medicine

## 2010-11-20 ENCOUNTER — Ambulatory Visit (INDEPENDENT_AMBULATORY_CARE_PROVIDER_SITE_OTHER): Payer: Medicare Other | Admitting: Family Medicine

## 2010-11-20 DIAGNOSIS — I10 Essential (primary) hypertension: Secondary | ICD-10-CM

## 2010-11-20 DIAGNOSIS — F411 Generalized anxiety disorder: Secondary | ICD-10-CM

## 2010-11-20 DIAGNOSIS — K589 Irritable bowel syndrome without diarrhea: Secondary | ICD-10-CM

## 2010-11-20 MED ORDER — HYOSCYAMINE SULFATE ER 0.375 MG PO TB12: 0.3750 mg | ORAL_TABLET | Freq: Two times a day (BID) | ORAL | Status: DC | PRN

## 2010-11-20 MED ORDER — ALPRAZOLAM 0.25 MG PO TABS: 0.2500 mg | ORAL_TABLET | Freq: Three times a day (TID) | ORAL | Status: DC | PRN

## 2010-11-20 NOTE — Assessment & Plan Note (Signed)
Unfortunately she is having another significant flare for IBS. I think that she is a very healthy 77 year old female and in my experience and seeing patients even older than her on Levbid. I would like her to start it for a short period time maybe one to 2 months and if she is doing well at that point then we can wean it off. She is so miserable right now I feel it would have to do something to try to help her symptoms. Certainly avoiding rich fluids and trying to be morere laxed with travel etc. will be helpful.

## 2010-11-20 NOTE — Progress Notes (Signed)
  Subjective:    Patient ID: Tonya [name], female    DOB: 13-Feb-1927, 77 y.o.   MRN: [id number] HPI Seh has been more stressed lately.  Her sister-in-law recently died. [place] went [place] the mountains.  She at a very rich cake while there and says her IBS has been flared every since then. Also had homemade ice cream. Used keopectac and that usually works but it didn't so took one of the lomotil (that we had called in ) and that relieved her sxs.  Having abdominal cramping.  She stopped her sodas ;and her coffee.  She has been drinking apple juice in the AM. Passing a lot of mucous.  Feels panicky when this happens. Note, she never filled the Levbid as there was a warning from the pharmacy about the medication because of her age. Her diarrhea from her IBS is complicated by the fact that she does have a rectocele and a cystocele both of which become very inflamed and she is having frequent diarrhea.  Here to f/u on BP as well. BPs was high at last ov.  Home BPS are 130/60s at home. She forgot to bring in her record.         Review of Systems     Objective:   Physical Exam  Constitutional: She is oriented to person, place, and time. She appears well-developed and well-nourished.  HENT:  Head: Normocephalic and atraumatic.  Eyes: Conjunctivae are normal.  Cardiovascular: Normal rate, regular rhythm and normal heart sounds.   Pulmonary/Chest: Effort normal and breath sounds normal.  Musculoskeletal: She exhibits no edema.  Neurological: She is alert and oriented to person, place, and time.  Skin: Skin is warm and dry.  Psychiatric: She has a normal mood and affect.          Assessment & Plan:

## 2010-11-20 NOTE — Assessment & Plan Note (Signed)
She has been using more of her Xanax to help treat the anxiety that comes with her irritable bowel syndrome. I did go ahead and give her refill for her prescription today. She will be traveling again soon and this is an exacerbating factor for her.

## 2010-11-20 NOTE — Assessment & Plan Note (Signed)
I really do think she has white coat hypertension as her home blood pressures seem to be well-controlled. She has brought her monitor in order to compare to our machine and it was consistent. We'll continue to monitor her pressure.

## 2010-12-16 ENCOUNTER — Emergency Department (HOSPITAL_BASED_OUTPATIENT_CLINIC_OR_DEPARTMENT_OTHER)
Admission: EM | Admit: 2010-12-16 | Discharge: 2010-12-17 | Disposition: A | Discharge status: Home / Self Care | Payer: Medicare Other | Attending: Emergency Medicine | Admitting: Emergency Medicine

## 2010-12-16 DIAGNOSIS — I10 Essential (primary) hypertension: Secondary | ICD-10-CM | POA: Insufficient documentation

## 2010-12-18 ENCOUNTER — Ambulatory Visit (INDEPENDENT_AMBULATORY_CARE_PROVIDER_SITE_OTHER): Payer: Medicare Other | Admitting: Family Medicine

## 2010-12-18 ENCOUNTER — Encounter: Payer: Self-pay | Admitting: Family Medicine

## 2010-12-18 VITALS — BP 159/90 | HR 72 | Ht 65.0 in | Wt 159.0 lb

## 2010-12-18 DIAGNOSIS — I10 Essential (primary) hypertension: Secondary | ICD-10-CM

## 2010-12-18 DIAGNOSIS — F411 Generalized anxiety disorder: Secondary | ICD-10-CM

## 2010-12-18 MED ORDER — ALPRAZOLAM 0.25 MG PO TABS: 0.2500 mg | ORAL_TABLET | Freq: Three times a day (TID) | ORAL | Status: DC | PRN

## 2010-12-18 MED ORDER — FLUOXETINE HCL (PMDD) 10 MG PO TABS: 10.0000 mg | ORAL_TABLET | Freq: Every day | ORAL | Status: DC

## 2010-12-18 NOTE — Progress Notes (Signed)
  Subjective:    Patient ID: Tonya [name], female    DOB: 1927-03-25, 77 y.o.   MRN: [id number]  HPI Micah Flesher out to eat and daughter notices her face was red so checked her BP when she got home. BP was over 200 so went to the ED.  By then in a full panic attack.  Went to [facility].  Finally had to take 2 xanax to calm down.She was in the Ed for about 5 hours.   She says she never has to take 2 tabs.  She had stopped he rBP pill a couple of weeks prior because she thought her BP was well controlled.  She has now restarted her losartan.  Does occ use Advil.     Review of Systems     Objective:   Physical Exam  Constitutional: She is oriented to person, place, and time. She appears well-developed and well-nourished.  HENT:  Head: Normocephalic and atraumatic.  Right Ear: External ear normal.  Left Ear: External ear normal.  Eyes: Conjunctivae are normal. Pupils are equal, round, and reactive to light.  Neck: Neck supple. No thyromegaly present.  Cardiovascular: Normal rate, regular rhythm and normal heart sounds.   Pulmonary/Chest: Effort normal and breath sounds normal.  Lymphadenopathy:    She has no cervical adenopathy.  Neurological: She is alert and oriented to person, place, and time.  Skin: Skin is warm and dry.  Psychiatric: She has a normal mood and affect. Her behavior is normal. Judgment and thought content normal.          Assessment & Plan:  I still think she has whitecoat hypertension. Her blood pressure is significantly elevated today even upon rechecking it was down almost 20 points at the end of the visit. Her home blood pressures except for this week and typically look very well controlled. I think that the elevated blood pressure the week and was probably incited by the stressors she was experiencing in addition to the fact that she had stopped her losartan thinking that she no longer needed it.

## 2010-12-18 NOTE — Assessment & Plan Note (Addendum)
I still think she has whitecoat hypertension. Her blood pressure is significantly elevated today even upon rechecking it was down almost 20 points at the end of the visit. Her home blood pressures except for this week and typically look very well controlled. I think that the elevated blood pressure the week and was probably incited by the stressors she was experiencing in addition to the fact that she had stopped her losartan thinking that she no longer needed it.Explained importance of taking her losartan regularly. Also avoid NSAIDs if possible esp for GI pain.  Can inc BP.  Recheck in one month.

## 2010-12-18 NOTE — Assessment & Plan Note (Addendum)
GAD-7 score of 15 (moderate). I agree with her that her anxiety is a huge conributor to her BP.  Again i stressed that she really needs to be on an SSRI for her sxs. She has finally agreed to try one. She already uses her xanx daily.  Will start with prozac. Discussed potential SE. F/U in 3 weeks to check and adjust medication. May even improve her IBS if we are able to get her anxiet better controlled with fewer panic attacks, etc.

## 2011-01-17 ENCOUNTER — Ambulatory Visit (INDEPENDENT_AMBULATORY_CARE_PROVIDER_SITE_OTHER): Payer: Medicare Other | Admitting: Family Medicine

## 2011-01-17 ENCOUNTER — Encounter: Payer: Self-pay | Admitting: Family Medicine

## 2011-01-17 DIAGNOSIS — I10 Essential (primary) hypertension: Secondary | ICD-10-CM

## 2011-01-17 DIAGNOSIS — F411 Generalized anxiety disorder: Secondary | ICD-10-CM

## 2011-01-17 NOTE — Assessment & Plan Note (Addendum)
I gave as much reassurance as possible. I really do want her to start the Prozac. She is dependent on benzodiazepines at this point. I gave her a small dose of 10 mg and explained that she was a little nurse about this she can even cut them in half and start with half a tab once daily. I really truly think that she would benefit from being on an SSRI. This is something she is to wear for a very long time. Her GAD- 7 score is 7 today her PHQ 9 score is 3. Followup in one month to make sure she is tolerating the Prozac well.

## 2011-01-17 NOTE — Progress Notes (Signed)
  Subjective:    Patient ID: Tonya [name], female    DOB: Feb 07, 1927, 77 y.o.   MRN: [id number]  HPI Hypertension-she says that since her last office visit she's been very consistent in taking her medications. She's been taking them every day. She did not take her dose this morning because she ran out but says she has a refill called in to her pharmacy and she plans on picking it up later today. She denies any chest pain or shortness of breath. She's otherwise felt well on the medication and she's been taking it daily. No dizziness.  Anxiety she did pick up the prescription for the Prozac but did not fill it. She was worried about how it might make her feel. A friend of hers recently passed away and she's also been very stressed because she found out that her daughter has multiple skin cancers. They are basal and squamous cell skin cancers.  Review of Systems     Objective:   Physical Exam  Constitutional: She is oriented to person, place, and time. She appears well-developed and well-nourished.  HENT:  Head: Normocephalic and atraumatic.  Cardiovascular: Normal rate, regular rhythm and normal heart sounds.   Pulmonary/Chest: Effort normal and breath sounds normal.  Neurological: She is alert and oriented to person, place, and time.  Skin: Skin is warm and dry.  Psychiatric: She has a normal mood and affect. Her behavior is normal.          Assessment & Plan:

## 2011-01-17 NOTE — Assessment & Plan Note (Signed)
Her pressure is much improved today from last time even though she missed her pill this morning. Encouraged her to pick up her new prescription and make sure she is taking consistently. I will see her back in one month to make sure that her blood pressure looks well controlled.

## 2011-02-07 ENCOUNTER — Encounter: Payer: Self-pay | Admitting: Family Medicine

## 2011-02-07 ENCOUNTER — Other Ambulatory Visit: Payer: Self-pay | Admitting: *Deleted

## 2011-02-07 MED ORDER — ESOMEPRAZOLE MAGNESIUM 40 MG PO CPDR: 40.0000 mg | DELAYED_RELEASE_CAPSULE | Freq: Every day | ORAL | Status: DC

## 2011-02-14 ENCOUNTER — Other Ambulatory Visit: Payer: Self-pay | Admitting: Family Medicine

## 2011-02-14 NOTE — Telephone Encounter (Signed)
Pt calling for refill of her xanex. Plan:   Pt too early for refill.  Told pt to call back on Friday since she will be due for 02-18-11 (Sunday) for refill and we will send over script at that time. Jarvis Newcomer, LPN Domingo Dimes

## 2011-02-20 ENCOUNTER — Other Ambulatory Visit: Payer: Self-pay | Admitting: *Deleted

## 2011-02-20 MED ORDER — ALPRAZOLAM 0.25 MG PO TABS: 0.2500 mg | ORAL_TABLET | Freq: Three times a day (TID) | ORAL | Status: DC | PRN

## 2011-02-20 NOTE — Telephone Encounter (Signed)
Pt calls upset that her meds have not been called into the pharmacy. Pt states that she called last week- on 02/09/2011 to request a refill on the Xanax- spoke with Darl Pikes and was told by Darl Pikes that "we would not keep rolling these meds out to her" and that she would call it in on Friday 02/16/2011 and the pharamcy would be told to not dispense until Sunday. Pt states she made her feel like she was a criminal and a drug seeker- which she says she is not. Darl Pikes was off on Friday and no one else was aware of this issue so the meds were not called in. Faxed the med into her pharmacy and sent pt to file complaint with office manager.

## 2011-02-21 ENCOUNTER — Encounter: Payer: Self-pay | Admitting: Family Medicine

## 2011-02-21 ENCOUNTER — Ambulatory Visit (INDEPENDENT_AMBULATORY_CARE_PROVIDER_SITE_OTHER): Payer: Medicare Other | Admitting: Family Medicine

## 2011-02-21 DIAGNOSIS — K589 Irritable bowel syndrome without diarrhea: Secondary | ICD-10-CM

## 2011-02-21 DIAGNOSIS — F411 Generalized anxiety disorder: Secondary | ICD-10-CM

## 2011-02-21 DIAGNOSIS — F419 Anxiety disorder, unspecified: Secondary | ICD-10-CM

## 2011-02-21 DIAGNOSIS — I10 Essential (primary) hypertension: Secondary | ICD-10-CM

## 2011-02-21 MED ORDER — LOSARTAN POTASSIUM-HCTZ 100-12.5 MG PO TABS: 1.0000 | ORAL_TABLET | Freq: Every day | ORAL | Status: DC

## 2011-02-21 MED ORDER — AMBULATORY NON FORMULARY MEDICATION: Status: DC

## 2011-02-21 NOTE — Progress Notes (Signed)
  Subjective:    Patient ID: Tonya [name], female    DOB: 1927/05/08, 77 y.o.   MRN: [id number]  HPI HTN- Says doing well. Getting upper 140s at home on her home BP. She does have white coat HT. No caffeine in her diet.  Take her med around evening meal time.  Did take it last night.   Mood- Doig well.  Tolerating her prozac well.  No SE.  Happy with her results thus far.   IBS - has been a little better.  Has been trying to increase her fiber.  Can't tolerate.  Use her medication prn.     Review of Systems     Objective:   Physical Exam  Constitutional: She is oriented to person, place, and time. She appears well-developed and well-nourished.  HENT:  Head: Normocephalic and atraumatic.  Right Ear: External ear normal.  Left Ear: External ear normal.  Nose: Nose normal.  Mouth/Throat: Oropharynx is clear and moist.       TMs and canals are full of wax.   Eyes: Conjunctivae and EOM are normal. Pupils are equal, round, and reactive to light.  Neck: Neck supple. No thyromegaly present.  Cardiovascular: Normal rate, regular rhythm and normal heart sounds.   Pulmonary/Chest: Effort normal and breath sounds normal. She has no wheezes.  Lymphadenopathy:    She has no cervical adenopathy.  Neurological: She is alert and oriented to person, place, and time.  Skin: Skin is warm and dry.  Psychiatric: She has a normal mood and affect.          Assessment & Plan:  Anxiety - GAD- 7 score is 4 today. Doing well on prozac. F/U in 2 months. Just refilled her xanax yestrday.    HTN- UP today she does have white coat HTN but she has still been running a little high at hone.  Will add low dose diuretic. F/U in 2 months. Will check BMP at that time.   IBS- Under control right now.   She declined flu vaccine and mammogram today.

## 2011-03-23 LAB — DIFFERENTIAL
Basophils Absolute: 0.1 10*3/uL (ref 0.0–0.1)
Basophils Relative: 1 % (ref 0–1)
Eosinophils Absolute: 0 10*3/uL (ref 0.0–0.7)
Eosinophils Relative: 0 % (ref 0–5)
Monocytes Absolute: 0.9 10*3/uL (ref 0.1–1.0)
Monocytes Relative: 7 % (ref 3–12)
Neutro Abs: 10.9 10*3/uL — ABNORMAL HIGH (ref 1.7–7.7)

## 2011-03-23 LAB — COMPREHENSIVE METABOLIC PANEL
ALT: 22 U/L (ref 0–35)
AST: 35 U/L (ref 0–37)
Albumin: 4.8 g/dL (ref 3.5–5.2)
Alkaline Phosphatase: 73 U/L (ref 39–117)
BUN: 19 mg/dL (ref 6–23)
Chloride: 106 mEq/L (ref 96–112)
Potassium: 4.6 mEq/L (ref 3.5–5.1)
Sodium: 144 mEq/L (ref 135–145)
Total Bilirubin: 1.1 mg/dL (ref 0.3–1.2)

## 2011-03-23 LAB — CBC
HCT: 45.5 % (ref 36.0–46.0)
Platelets: 190 10*3/uL (ref 150–400)
WBC: 12.5 10*3/uL — ABNORMAL HIGH (ref 4.0–10.5)

## 2011-04-13 ENCOUNTER — Other Ambulatory Visit: Payer: Self-pay | Admitting: *Deleted

## 2011-04-13 MED ORDER — ALPRAZOLAM 0.25 MG PO TABS: 0.2500 mg | ORAL_TABLET | Freq: Three times a day (TID) | ORAL | Status: DC | PRN

## 2011-05-07 ENCOUNTER — Encounter: Payer: Self-pay | Admitting: Family Medicine

## 2011-05-07 ENCOUNTER — Ambulatory Visit (INDEPENDENT_AMBULATORY_CARE_PROVIDER_SITE_OTHER): Payer: Medicare Other | Admitting: Family Medicine

## 2011-05-07 VITALS — BP 136/69 | HR 79 | Wt 161.0 lb

## 2011-05-07 DIAGNOSIS — K589 Irritable bowel syndrome without diarrhea: Secondary | ICD-10-CM

## 2011-05-07 DIAGNOSIS — F419 Anxiety disorder, unspecified: Secondary | ICD-10-CM

## 2011-05-07 DIAGNOSIS — I10 Essential (primary) hypertension: Secondary | ICD-10-CM

## 2011-05-07 DIAGNOSIS — F411 Generalized anxiety disorder: Secondary | ICD-10-CM

## 2011-05-07 NOTE — Progress Notes (Signed)
  Subjective:    Patient ID: Tonya [name], female    DOB: 1926-08-18, 77 y.o.   MRN: [id number]  HPI HTN- BP well controlled.  Looks great she is tolerating her regimen well. We added low-dose diuretic to her blood pressure pill last office visit.  IBS - mildly improved.  This is struggling with this. 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 This is always a stressful time of year for her.  Anxiety - On prozac and doing well. She denies any side effects and is tolerating it well. She's had some recent stressful events including her son who was recently hospitalized after a 4 wheeler accident. She also has a believe a daughter-in-law who is going into surgery for hernia repair. She's actually dealt with the stressful things fairly well. Her sleep quality is fair.   Review of Systems     Objective:   Physical Exam  Constitutional: She is oriented to person, place, and time. She appears well-developed and well-nourished.  HENT:  Head: Normocephalic and atraumatic.  Cardiovascular: Normal rate, regular rhythm and normal heart sounds.   Pulmonary/Chest: Effort normal and breath sounds normal.  Neurological: She is alert and oriented to person, place, and time.  Skin: Skin is warm and dry.  Psychiatric: She has a normal mood and affect. Her behavior is normal.          Assessment & Plan:  Anixety-doing well on the prozac. GAD-7 score of 5. Followup in 3-4 months.  IBS - Well controlled. I think that the Prozac is also helping her IBS.  Hypertension-we need to recheck a metabolic panel today to make sure that her kidney doctor and potassium are stable since we added a diuretic to her regimen last time. Monitor for any lightheadedness or dizziness or low blood pressures. Recheck in 3-4 months. We will call her with the lab results.

## 2011-05-08 ENCOUNTER — Other Ambulatory Visit: Payer: Self-pay | Admitting: Family Medicine

## 2011-05-08 LAB — COMPLETE METABOLIC PANEL WITH GFR
Albumin: 4.3 g/dL (ref 3.5–5.2)
BUN: 25 mg/dL — ABNORMAL HIGH (ref 6–23)
CO2: 28 mEq/L (ref 19–32)
Calcium: 9.2 mg/dL (ref 8.4–10.5)
Chloride: 105 mEq/L (ref 96–112)
GFR, Est Non African American: 43 mL/min — ABNORMAL LOW
Glucose, Bld: 86 mg/dL (ref 70–99)
Potassium: 4.2 mEq/L (ref 3.5–5.3)
Sodium: 141 mEq/L (ref 135–145)
Total Protein: 6.6 g/dL (ref 6.0–8.3)

## 2011-05-08 MED ORDER — LOSARTAN POTASSIUM 100 MG PO TABS: 100.0000 mg | ORAL_TABLET | Freq: Every day | ORAL | Status: DC

## 2011-05-08 MED ORDER — METOPROLOL SUCCINATE ER 25 MG PO TB24: 25.0000 mg | ORAL_TABLET | Freq: Every day | ORAL | Status: DC

## 2011-06-08 ENCOUNTER — Telehealth: Payer: Self-pay | Admitting: *Deleted

## 2011-06-08 MED ORDER — ALPRAZOLAM 0.25 MG PO TABS: 0.2500 mg | ORAL_TABLET | Freq: Three times a day (TID) | ORAL | Status: DC | PRN

## 2011-06-08 NOTE — Telephone Encounter (Signed)
Pt.notified

## 2011-06-08 NOTE — Telephone Encounter (Signed)
Pt called and states her head felt "funny" yesterday and she laid down and took a nap and woke up and checked BP and it was 200/101. Pt then went to [facility] and pharm checked BP and it was 219/101. Pt states he gave her two xanax because she was out and BP started to come down. Pt states she took her BP this am and it was 130/82.Pt states it has not been running high and she has been taking bp meds

## 2011-06-08 NOTE — Telephone Encounter (Signed)
Make sure really watching salt intake esp around the holiday bc that will really shoot her pressure up quickly. Also used the xanax if feeling really anxious.

## 2011-06-21 ENCOUNTER — Encounter: Payer: Self-pay | Admitting: Family Medicine

## 2011-06-21 ENCOUNTER — Ambulatory Visit (INDEPENDENT_AMBULATORY_CARE_PROVIDER_SITE_OTHER): Payer: Medicare Other | Admitting: Family Medicine

## 2011-06-21 VITALS — BP 156/87 | HR 81 | Wt 163.0 lb

## 2011-06-21 DIAGNOSIS — Z13820 Encounter for screening for osteoporosis: Secondary | ICD-10-CM

## 2011-06-21 DIAGNOSIS — Z23 Encounter for immunization: Secondary | ICD-10-CM

## 2011-06-21 DIAGNOSIS — I10 Essential (primary) hypertension: Secondary | ICD-10-CM

## 2011-06-21 MED ORDER — METOPROLOL SUCCINATE ER 50 MG PO TB24: 50.0000 mg | ORAL_TABLET | Freq: Every day | ORAL | Status: DC

## 2011-06-21 MED ORDER — AMBULATORY NON FORMULARY MEDICATION: Status: DC

## 2011-06-21 NOTE — Progress Notes (Signed)
  Subjective:    Patient ID: Tonya [name], female    DOB: 1926-07-04, 78 y.o.   MRN: [id number]  Hypertension This is a chronic problem. The current episode started more than 1 year ago. The problem is uncontrolled. Associated symptoms include anxiety. Pertinent negatives include no chest pain. Risk factors for coronary artery disease include stress and post-menopausal state. Past treatments include angiotensin blockers and beta blockers. The current treatment provides moderate improvement. There are no compliance problems.       Review of Systems  Cardiovascular: Negative for chest pain.       Objective:   Physical Exam  Constitutional: She is oriented to person, place, and time. She appears well-developed and well-nourished.  HENT:  Head: Normocephalic and atraumatic.  Cardiovascular: Normal rate, regular rhythm and normal heart sounds.   Pulmonary/Chest: Effort normal and breath sounds normal.  Musculoskeletal: She exhibits no edema.  Neurological: She is alert and oriented to person, place, and time.  Skin: Skin is warm and dry.  Psychiatric: She has a normal mood and affect. Her behavior is normal.          Assessment & Plan:  Hypertension-she is not well controlled today. Her last checked last month was normal. She did bring in her blood pressure meter today and I looked through her blood pressures. They primarily are running in the upper 130s to the 160s. We discussed working on trying to overall bring his blood pressures down by about 10 points to get closer to goal. We will try increasing her metoprolol to 50 mg at bedtime and see if this helps. Hopefully would not cause excessive fatigue or sedation. She did well on HCTZ in the past but unfortunately it was too drying to her kidneys and we had to stop it.  I encouraged her to schedule an annual wellness exam.  Tdap updated today. We also discussed getting a shingles vaccine. I printed a prescription for her to take to  the pharmacy since it is covered under her Medicare part D.

## 2011-06-21 NOTE — Progress Notes (Signed)
Addended by: Avon Gully C on: 06/21/2011 01:08 PM   Modules accepted: Orders

## 2011-06-27 ENCOUNTER — Other Ambulatory Visit: Payer: Self-pay | Admitting: *Deleted

## 2011-06-27 MED ORDER — ESOMEPRAZOLE MAGNESIUM 40 MG PO CPDR: 40.0000 mg | DELAYED_RELEASE_CAPSULE | Freq: Every day | ORAL | Status: DC

## 2011-06-28 ENCOUNTER — Other Ambulatory Visit: Payer: Self-pay | Admitting: *Deleted

## 2011-07-06 ENCOUNTER — Ambulatory Visit: Payer: Medicare Other | Admitting: Family Medicine

## 2011-07-06 ENCOUNTER — Ambulatory Visit (INDEPENDENT_AMBULATORY_CARE_PROVIDER_SITE_OTHER): Payer: Medicare Other | Admitting: Physician Assistant

## 2011-07-06 ENCOUNTER — Encounter: Payer: Self-pay | Admitting: Physician Assistant

## 2011-07-06 VITALS — BP 187/83 | HR 58 | Temp 97.9°F | Ht 68.0 in | Wt 160.0 lb

## 2011-07-06 DIAGNOSIS — F32A Depression, unspecified: Secondary | ICD-10-CM

## 2011-07-06 DIAGNOSIS — R5383 Other fatigue: Secondary | ICD-10-CM

## 2011-07-06 DIAGNOSIS — F419 Anxiety disorder, unspecified: Secondary | ICD-10-CM

## 2011-07-06 DIAGNOSIS — F341 Dysthymic disorder: Secondary | ICD-10-CM

## 2011-07-06 DIAGNOSIS — R5381 Other malaise: Secondary | ICD-10-CM

## 2011-07-06 DIAGNOSIS — I10 Essential (primary) hypertension: Secondary | ICD-10-CM

## 2011-07-06 MED ORDER — AMLODIPINE BESYLATE 2.5 MG PO TABS: 5.0000 mg | ORAL_TABLET | Freq: Every day | ORAL | Status: DC

## 2011-07-06 NOTE — Progress Notes (Signed)
  Subjective:    Patient ID: Tonya [name], female    DOB: [birth year], 78 y.o.   MRN: [id number]  HPI Patient reports 9 days of not feeling well. She reports she has spent 3 days in bed. Her son, who lives with her, has had a virus recently. She has not been able to eat and feels very weak. Denies vomiting, fever, SOB, wheezing, sore throat, ear pain, or fever. She has had a persistent cough with some clear productive cough. She has some diarrhea over the past couple of days. She denies chest pains and any numbness. Does report feeling a little bit better over the past couple of days.   She had a some blood pressure problems. She is taking all of her blood pressure medications. She denies headaches. She has a history of uncontrolled HTN.  She has continual anxiety. Daughter reports that "everything makes my mom anxious" any phone calls of bad news gets her upset for hours. Patient reports her mind just "runs" all the time worrying about everyone. She does take Xanax about 2 times a day. It helps but then it comes back. She doesn't have a desire to get out of bed a lot, she has no appetite most of the time.    Review of Systems     Objective:   Physical Exam  Constitutional: She is oriented to person, place, and time. She appears well-developed and well-nourished.  HENT:  Head: Normocephalic and atraumatic.  Right Ear: External ear normal.  Left Ear: External ear normal.  Nose: Nose normal.  Mouth/Throat: Oropharynx is clear and moist. No oropharyngeal exudate.  Eyes: Conjunctivae are normal.  Neck: Normal range of motion. Neck supple.  Cardiovascular: Regular rhythm, normal heart sounds and intact distal pulses.        Bradycardia at 58.  Pulmonary/Chest: Effort normal and breath sounds normal. She has no wheezes.  Lymphadenopathy:    She has no cervical adenopathy.  Neurological: She is alert and oriented to person, place, and time.  Skin: Skin is warm and dry.  Psychiatric: She  has a normal mood and affect. Her behavior is normal.          Assessment & Plan:  Fatigue- Ordered a CBC with diff to check WBC and Hemoglobin and an Electrolyte panel due to recent 9 day sickness. Discussed with patient that increasing metoprolol could make her feel fatigued and since we increased it at last visit we decreased it today. I told her to take 1/2 of the 50mg  tab of metoprolol at night.   Hypertension-ECG(No acute changes). Patient has had uncontrolled HTN. Started on Norvasc 5 mg daily. Some of HTN might be from GAD. I hope by taking measures to control anxiety/depression her blood pressure might improve. Talked about reviewing her diet and making sure she is not consuming high salt foods.   Anxiety and Depression- Patient has had a history of anxiety. Told her to start Prozac prescription that she filled at a previous visit but never started. Patient worries about it making her suicidal. I discussed that in a small percentage of patients it does increase depression; however, in a bigger percentage it helps there depression. I encouraged her to try it because it might help her anxiety and depression. I also encouraged her to get out with friends, mentioned going to the [facility] and doing water aerobics or walking around the track. Continue to use Xanax for panic attacks.  Follow up in 4-6 weeks.

## 2011-07-06 NOTE — Patient Instructions (Addendum)
Decrease metoprolol at bedtime to 1/2 of the 50 mg tablet. Start Norvasc 5mg  every day. Start Prozac 10mg  daily.Nurse visit to recheck blood pressure in 2 weeks. F/u on depression and fatigue in 4-6 weeks. Will call with lab results.

## 2011-07-07 LAB — ELECTROLYTE PANEL: Potassium: 5.1 mEq/L (ref 3.5–5.3)

## 2011-07-07 LAB — CBC WITH DIFFERENTIAL/PLATELET
Eosinophils Absolute: 0 10*3/uL (ref 0.0–0.7)
Eosinophils Relative: 1 % (ref 0–5)
HCT: 43.7 % (ref 36.0–46.0)
Hemoglobin: 14.3 g/dL (ref 12.0–15.0)
Lymphocytes Relative: 30 % (ref 12–46)
Lymphs Abs: 1.2 10*3/uL (ref 0.7–4.0)
MCH: 30 pg (ref 26.0–34.0)
MCV: 91.8 fL (ref 78.0–100.0)
Monocytes Absolute: 0.6 10*3/uL (ref 0.1–1.0)
Monocytes Relative: 14 % — ABNORMAL HIGH (ref 3–12)
Platelets: 203 10*3/uL (ref 150–400)
RBC: 4.76 MIL/uL (ref 3.87–5.11)
WBC: 3.9 10*3/uL — ABNORMAL LOW (ref 4.0–10.5)

## 2011-07-13 ENCOUNTER — Encounter: Payer: Self-pay | Admitting: *Deleted

## 2011-07-27 ENCOUNTER — Ambulatory Visit (INDEPENDENT_AMBULATORY_CARE_PROVIDER_SITE_OTHER): Payer: Medicare Other | Admitting: Physician Assistant

## 2011-07-27 ENCOUNTER — Encounter: Payer: Self-pay | Admitting: Physician Assistant

## 2011-07-27 DIAGNOSIS — I10 Essential (primary) hypertension: Secondary | ICD-10-CM

## 2011-07-27 DIAGNOSIS — F411 Generalized anxiety disorder: Secondary | ICD-10-CM

## 2011-07-27 MED ORDER — ALPRAZOLAM 0.25 MG PO TABS: 0.2500 mg | ORAL_TABLET | Freq: Three times a day (TID) | ORAL | Status: DC | PRN

## 2011-07-27 NOTE — Patient Instructions (Signed)
Stay on the same 3 blood pressure medications. Start Prozac. Continue with Xanax. Recheck in 1 month.

## 2011-07-27 NOTE — Progress Notes (Signed)
  Subjective:    Patient ID: Tonya [name], female    DOB: 02-02-1927, 78 y.o.   MRN: [id number]  HPI Patient presents to clinic to follow up on blood pressure. She reports she has not felt well since she was sick at the beginning of January. She has not started her prozac despite still feeling very anxious. She has a lot going on right now in her family and she has been visiting a lot of people in the hospital lately. She continues to take Xanax as needed she has been taking 2-3 a day.She has not checked her blood pressure since last doctor visit. She denies headaches or blurred vision. She has started Norvasc and added to blood pressure mediation routine. She has noticed some swelling of her right leg. She reports that it resolves with elevation. She denies chest pain or SOB.   Has not had zostavax. She was given prescription to go to pharmacy but has not went and got shot.  Review of Systems     Objective:   Physical Exam  Constitutional: She is oriented to person, place, and time. She appears well-developed and well-nourished.  HENT:  Head: Normocephalic and atraumatic.  Right Ear: External ear normal.  Left Ear: External ear normal.  Nose: Nose normal.  Mouth/Throat: Oropharynx is clear and moist. No oropharyngeal exudate.  Eyes: Conjunctivae are normal.  Neck: Normal range of motion. Neck supple.  Cardiovascular: Normal rate, regular rhythm and normal heart sounds.   Pulmonary/Chest: Effort normal and breath sounds normal.  Neurological: She is alert and oriented to person, place, and time.  Skin:       Right leg 1+ pitting edema.   Psychiatric: Her behavior is normal.       Anxious.           Assessment & Plan:  HTN- Rechecked 142/78 (improved). She will stay on 3 bp medication routine.Will recheck in 1 month.  Anxiety- Patient has prescription of Prozac. Patient reports she will start now and recheck in 1 month. Discussed with patient that she had done well in the past  with prozac and that some of her blood pressure being out of control could be related to anxiety. Patient says she understand and will start medication. Refilled Xanax.

## 2011-08-08 ENCOUNTER — Other Ambulatory Visit: Payer: Self-pay | Admitting: *Deleted

## 2011-08-08 MED ORDER — AMLODIPINE BESYLATE 2.5 MG PO TABS: 5.0000 mg | ORAL_TABLET | Freq: Every day | ORAL | Status: DC

## 2011-09-12 ENCOUNTER — Ambulatory Visit (INDEPENDENT_AMBULATORY_CARE_PROVIDER_SITE_OTHER): Payer: Medicare Other | Admitting: Family Medicine

## 2011-09-12 ENCOUNTER — Encounter: Payer: Self-pay | Admitting: Family Medicine

## 2011-09-12 VITALS — BP 146/88 | HR 64 | Ht 68.0 in | Wt 163.0 lb

## 2011-09-12 DIAGNOSIS — F411 Generalized anxiety disorder: Secondary | ICD-10-CM

## 2011-09-12 DIAGNOSIS — F419 Anxiety disorder, unspecified: Secondary | ICD-10-CM

## 2011-09-12 DIAGNOSIS — I10 Essential (primary) hypertension: Secondary | ICD-10-CM

## 2011-09-12 MED ORDER — ALPRAZOLAM 0.25 MG PO TABS: 0.2500 mg | ORAL_TABLET | Freq: Three times a day (TID) | ORAL | Status: DC | PRN

## 2011-09-12 NOTE — Progress Notes (Signed)
  Subjective:    Patient ID: Tonya [name], female    DOB: 1927/05/02, 78 y.o.   MRN: [id number]  HPI HTN - BP was 127/73 this AM. Highest at home is 135.  She quit taking her amlodipine and her metoprolol bc of ankle swelling. She wasn't sure what was causing it.    Anxiety - Says her anxiety has ben well controlled so stopped her fluoxetine. Says she is still very fearful of the medication. Though when has take it she did well on it. She feels he rIBS has been better.   IBS - Better right now. Stay away from caffeine and carbonated beverages.   Review of Systems     Objective:   Physical Exam  Constitutional: She is oriented to person, place, and time. She appears well-developed and well-nourished.  HENT:  Head: Normocephalic and atraumatic.  Cardiovascular: Normal rate, regular rhythm and normal heart sounds.   Pulmonary/Chest: Effort normal and breath sounds normal.  Neurological: She is alert and oriented to person, place, and time.  Skin: Skin is warm and dry.  Psychiatric: She has a normal mood and affect. Her behavior is normal.          Assessment & Plan:  HTN - Repeat BP better. Home BPs at goal. Will d/c the amlodipine bc of ankle swelling.  Continue other meds F/U in 1 mo.   Anxiety - doing well right now but still encouraged her take her fluoxtine. I really think this helps her anxiety and her IBSsxs.  Continue xanax prn. She does need a refill.

## 2011-09-12 NOTE — Patient Instructions (Signed)
Check bottle. The amlodipine can cause ankle swelling so can restart your losartan and the metoprolol.

## 2011-10-03 ENCOUNTER — Other Ambulatory Visit: Payer: Self-pay | Admitting: *Deleted

## 2011-10-03 MED ORDER — ESOMEPRAZOLE MAGNESIUM 40 MG PO CPDR: 40.0000 mg | DELAYED_RELEASE_CAPSULE | Freq: Every day | ORAL | Status: DC

## 2011-10-29 ENCOUNTER — Encounter: Payer: Self-pay | Admitting: Family Medicine

## 2011-10-29 ENCOUNTER — Ambulatory Visit (INDEPENDENT_AMBULATORY_CARE_PROVIDER_SITE_OTHER): Payer: Medicare Other | Admitting: Family Medicine

## 2011-10-29 VITALS — BP 135/81 | HR 79 | Ht 68.0 in | Wt 162.0 lb

## 2011-10-29 DIAGNOSIS — F411 Generalized anxiety disorder: Secondary | ICD-10-CM

## 2011-10-29 DIAGNOSIS — I10 Essential (primary) hypertension: Secondary | ICD-10-CM

## 2011-10-29 DIAGNOSIS — F419 Anxiety disorder, unspecified: Secondary | ICD-10-CM

## 2011-10-29 MED ORDER — ALPRAZOLAM 0.25 MG PO TABS: 0.2500 mg | ORAL_TABLET | Freq: Three times a day (TID) | ORAL | Status: DC | PRN

## 2011-10-29 NOTE — Progress Notes (Signed)
  Subjective:    Patient ID: Tonya [name], female    DOB: [birth year], 78 y.o.   MRN: [id number]  HPI HTN - 135/81 repeat.  Ankle swelling is resolved.  Still didn' take the metoprolol  Not taking her fluoxetine. Taking xanax every night.  Her IBS has been flaring right now.  Started this AM with frequent BMs. .  Didn't go for labs last time.  No CP or SOB.  Has had some stress.     Review of Systems     Objective:   Physical Exam  Constitutional: She is oriented to person, place, and time. She appears well-developed and well-nourished.  HENT:  Head: Normocephalic and atraumatic.  Cardiovascular: Normal rate, regular rhythm and normal heart sounds.   Pulmonary/Chest: Effort normal and breath sounds normal.  Musculoskeletal:       No LE edema.   Neurological: She is alert and oriented to person, place, and time.  Skin: Skin is warm and dry.  Psychiatric: She has a normal mood and affect. Her behavior is normal.          Assessment & Plan:  HTN- Controlled. Off the toprol.  Only on losartan.  BPs at home under control.  F/U in 6 months.  Due for CMP, lipids. She lost hre paperwork last time here  Anxiety - we discussed once again the her SSRI work much better if she took it on a daily basis versus using it infrequently when she feels she needs it. Also in her that would help with her IBS symptoms. I'm not sure how can that she is. But she does have a prescription and certainly has the option of taking it if she needs to. If her IBS is not calming down or her anxiety gets worse then please followup sooner rather than later.

## 2011-10-30 LAB — LIPID PANEL
HDL: 47 mg/dL (ref >39)
LDL Cholesterol: 141 mg/dL — ABNORMAL HIGH (ref 0–99)

## 2011-10-30 LAB — COMPLETE METABOLIC PANEL WITH GFR
ALT: 12 U/L (ref 0–35)
Alkaline Phosphatase: 67 U/L (ref 39–117)
CO2: 29 mEq/L (ref 19–32)
GFR, Est African American: 59 mL/min — ABNORMAL LOW
Sodium: 145 mEq/L (ref 135–145)
Total Bilirubin: 1.2 mg/dL (ref 0.3–1.2)
Total Protein: 6.9 g/dL (ref 6.0–8.3)

## 2011-11-05 ENCOUNTER — Ambulatory Visit (INDEPENDENT_AMBULATORY_CARE_PROVIDER_SITE_OTHER): Payer: Medicare Other | Admitting: Physician Assistant

## 2011-11-05 ENCOUNTER — Encounter: Payer: Self-pay | Admitting: Physician Assistant

## 2011-11-05 VITALS — BP 148/82 | HR 79 | Temp 98.3°F | Ht 68.0 in | Wt 163.0 lb

## 2011-11-05 DIAGNOSIS — N390 Urinary tract infection, site not specified: Secondary | ICD-10-CM

## 2011-11-05 LAB — POCT URINALYSIS DIPSTICK
Bilirubin, UA: NEGATIVE
Glucose, UA: NEGATIVE
Spec Grav, UA: 1.02

## 2011-11-05 MED ORDER — SULFAMETHOXAZOLE-TRIMETHOPRIM 800-160 MG PO TABS: 1.0000 | ORAL_TABLET | Freq: Two times a day (BID) | ORAL | Status: AC

## 2011-11-05 NOTE — Progress Notes (Signed)
  Subjective:    Patient ID: Tonya [name], female    DOB: 1927/02/27, 78 y.o.   MRN: [id number]  HPI Patient presents to the clinic with dysuria and lower abdominal pressure for 3 days. She denies any fevers, chills, muscle aches or back pain. There is a lot of pressure when she goes to the bathroom. She denies blood in urine or any unusual vaginal discharge. She has been taking ibuprofen for the pain and it does help. She has not tried anything else to make the pain better. She does shrink a lot of water daily. Patient does not have a history of urinary tract infections.       Review of Systems     Objective:   Physical Exam  Constitutional: She is oriented to person, place, and time. She appears well-developed and well-nourished.  HENT:  Head: Normocephalic and atraumatic.  Cardiovascular: Normal rate, regular rhythm and normal heart sounds.   Pulmonary/Chest: Effort normal and breath sounds normal. She has no wheezes.       No CVA tenderness.  Abdominal: Soft. Bowel sounds are normal. She exhibits no distension and no mass. There is no rebound and no guarding.       Tenderness with palpation of the lower abdomen.  Neurological: She is alert and oriented to person, place, and time.  Skin: Skin is warm and dry.  Psychiatric: She has a normal mood and affect. Her behavior is normal.          Assessment & Plan:  UTI-UA was positive for blood and leukocytes. Patient was treated with Bactrim twice a day for 3 days. Patient was told to call office if not improving in the next 24-48 hours. She was instructed to rest and drink lots of water. Handout was given on urinary tract infection. UA was sent for culture and sensitivity.

## 2011-11-05 NOTE — Patient Instructions (Signed)
STart Bactrim twice a day for 3 days. Call if not feeling better by Wednesday. Stay hydrated.  Urinary Tract Infection Infections of the urinary tract can start in several places. A bladder infection (cystitis), a kidney infection (pyelonephritis), and a prostate infection (prostatitis) are different types of urinary tract infections (UTIs). They usually get better if treated with medicines (antibiotics) that kill germs. Take all the medicine until it is gone. You or your child may feel better in a few days, but TAKE ALL MEDICINE or the infection may not respond and may become more difficult to treat. HOME CARE INSTRUCTIONS   Drink enough water and fluids to keep the urine clear or pale yellow. Cranberry juice is especially recommended, in addition to large amounts of water.   Avoid caffeine, tea, and carbonated beverages. They tend to irritate the bladder.   Alcohol may irritate the prostate.   Only take over-the-counter or prescription medicines for pain, discomfort, or fever as directed by your caregiver.  To prevent further infections:  Empty the bladder often. Avoid holding urine for long periods of time.   After a bowel movement, women should cleanse from front to back. Use each tissue only once.   Empty the bladder before and after sexual intercourse.  FINDING OUT THE RESULTS OF YOUR TEST Not all test results are available during your visit. If your or your child's test results are not back during the visit, make an appointment with your caregiver to find out the results. Do not assume everything is normal if you have not heard from your caregiver or the medical facility. It is important for you to follow up on all test results. SEEK MEDICAL CARE IF:   There is back pain.   Your baby is older than 3 months with a rectal temperature of 100.5 F (38.1 C) or higher for more than 1 day.   Your or your child's problems (symptoms) are no better in 3 days. Return sooner if you or your  child is getting worse.  SEEK IMMEDIATE MEDICAL CARE IF:   There is severe back pain or lower abdominal pain.   You or your child develops chills.   You have a fever.   Your baby is older than 3 months with a rectal temperature of 102 F (38.9 C) or higher.   Your baby is 43 months old or younger with a rectal temperature of 100.4 F (38 C) or higher.   There is nausea or vomiting.   There is continued burning or discomfort with urination.  MAKE SURE YOU:   Understand these instructions.   Will watch your condition.   Will get help right away if you are not doing well or get worse.  Document Released: 03/14/2005 Document Revised: 05/24/2011 Document Reviewed: 10/17/2006 [facility] Patient Information 2012 [place], [place].

## 2011-11-08 LAB — CULTURE, URINE COMPREHENSIVE: Colony Count: >=100000

## 2011-12-14 ENCOUNTER — Other Ambulatory Visit: Payer: Self-pay | Admitting: *Deleted

## 2011-12-14 MED ORDER — LOSARTAN POTASSIUM 100 MG PO TABS: 100.0000 mg | ORAL_TABLET | Freq: Every day | ORAL | Status: DC

## 2012-01-03 ENCOUNTER — Other Ambulatory Visit: Payer: Self-pay | Admitting: *Deleted

## 2012-01-03 MED ORDER — ALPRAZOLAM 0.25 MG PO TABS: 0.2500 mg | ORAL_TABLET | Freq: Three times a day (TID) | ORAL | Status: DC | PRN

## 2012-01-28 ENCOUNTER — Other Ambulatory Visit: Payer: Self-pay | Admitting: *Deleted

## 2012-01-28 MED ORDER — ESOMEPRAZOLE MAGNESIUM 40 MG PO CPDR: 40.0000 mg | DELAYED_RELEASE_CAPSULE | Freq: Every day | ORAL | Status: DC

## 2012-02-25 ENCOUNTER — Ambulatory Visit (INDEPENDENT_AMBULATORY_CARE_PROVIDER_SITE_OTHER): Payer: Medicare Other | Admitting: Family Medicine

## 2012-02-25 ENCOUNTER — Encounter: Payer: Self-pay | Admitting: Family Medicine

## 2012-02-25 VITALS — BP 140/90 | HR 88 | Wt 160.0 lb

## 2012-02-25 DIAGNOSIS — J329 Chronic sinusitis, unspecified: Secondary | ICD-10-CM

## 2012-02-25 MED ORDER — AMOXICILLIN-POT CLAVULANATE 875-125 MG PO TABS: 1.0000 | ORAL_TABLET | Freq: Two times a day (BID) | ORAL | Status: AC

## 2012-02-25 MED ORDER — ALPRAZOLAM 0.25 MG PO TABS: 0.2500 mg | ORAL_TABLET | Freq: Three times a day (TID) | ORAL | Status: DC | PRN

## 2012-02-25 NOTE — Patient Instructions (Signed)

## 2012-02-25 NOTE — Progress Notes (Signed)
  Subjective:    Patient ID: Tonya [name], female    DOB: 05-May-1927, 78 y.o.   MRN: [id number]  HPI 8 days of cough and nasal congestion, HA.  Yellow nasal drainage. Cough is making her very tired.  + sick contacts.  Tied some clariting thinking it was allergies.  Not helpinbg. No fever.  Using cough drops.  Some chest discomfort with the cough.    Review of Systems     Objective:   Physical Exam  Constitutional: She is oriented to person, place, and time. She appears well-developed and well-nourished.  HENT:  Head: Normocephalic and atraumatic.  Right Ear: External ear normal.  Left Ear: External ear normal.  Nose: Nose normal.  Mouth/Throat: Oropharynx is clear and moist.       TMs and canals are clear.   Eyes: Conjunctivae and EOM are normal. Pupils are equal, round, and reactive to light.  Neck: Neck supple. No thyromegaly present.  Cardiovascular: Normal rate, regular rhythm and normal heart sounds.   Pulmonary/Chest: Effort normal and breath sounds normal. She has no wheezes.  Lymphadenopathy:    She has no cervical adenopathy.  Neurological: She is alert and oriented to person, place, and time.  Skin: Skin is warm and dry.  Psychiatric: She has a normal mood and affect.          Assessment & Plan:  Sinusitis - will tx w/ augmentin. Can use mucinex. Call if not better in 2 weeks.  H.O given.

## 2012-04-23 ENCOUNTER — Other Ambulatory Visit: Payer: Self-pay | Admitting: *Deleted

## 2012-04-23 MED ORDER — ALPRAZOLAM 0.25 MG PO TABS: 0.2500 mg | ORAL_TABLET | Freq: Three times a day (TID) | ORAL | Status: DC | PRN

## 2012-05-31 ENCOUNTER — Other Ambulatory Visit: Payer: Self-pay | Admitting: Physician Assistant

## 2012-06-03 ENCOUNTER — Other Ambulatory Visit: Payer: Self-pay | Admitting: Family Medicine

## 2012-06-04 ENCOUNTER — Other Ambulatory Visit: Payer: Self-pay | Admitting: *Deleted

## 2012-06-04 MED ORDER — ALPRAZOLAM 0.25 MG PO TABS: 0.2500 mg | ORAL_TABLET | Freq: Three times a day (TID) | ORAL | Status: DC | PRN

## 2012-06-04 NOTE — Telephone Encounter (Signed)
[name] called and left a message on the nurse voicemail for a return call.  I returned her call and the line was busy x 3.

## 2012-07-12 ENCOUNTER — Other Ambulatory Visit: Payer: Self-pay | Admitting: Family Medicine

## 2012-07-17 ENCOUNTER — Other Ambulatory Visit: Payer: Self-pay | Admitting: Family Medicine

## 2012-07-17 ENCOUNTER — Other Ambulatory Visit: Payer: Self-pay | Admitting: *Deleted

## 2012-07-17 MED ORDER — ALPRAZOLAM 0.25 MG PO TABS: 0.2500 mg | ORAL_TABLET | Freq: Three times a day (TID) | ORAL | Status: DC | PRN

## 2012-08-08 ENCOUNTER — Other Ambulatory Visit: Payer: Self-pay | Admitting: Family Medicine

## 2012-08-11 ENCOUNTER — Ambulatory Visit (INDEPENDENT_AMBULATORY_CARE_PROVIDER_SITE_OTHER): Payer: Medicare Other | Admitting: Family Medicine

## 2012-08-11 ENCOUNTER — Encounter: Payer: Self-pay | Admitting: Family Medicine

## 2012-08-11 VITALS — BP 175/83 | HR 68 | Wt 161.0 lb

## 2012-08-11 DIAGNOSIS — I10 Essential (primary) hypertension: Secondary | ICD-10-CM

## 2012-08-11 DIAGNOSIS — F411 Generalized anxiety disorder: Secondary | ICD-10-CM

## 2012-08-11 DIAGNOSIS — K589 Irritable bowel syndrome without diarrhea: Secondary | ICD-10-CM

## 2012-08-11 DIAGNOSIS — R197 Diarrhea, unspecified: Secondary | ICD-10-CM

## 2012-08-11 LAB — COMPLETE METABOLIC PANEL WITH GFR
ALT: 14 U/L (ref 0–35)
AST: 18 U/L (ref 0–37)
Albumin: 4.3 g/dL (ref 3.5–5.2)
CO2: 26 mEq/L (ref 19–32)
Calcium: 9.4 mg/dL (ref 8.4–10.5)
Chloride: 106 mEq/L (ref 96–112)
GFR, Est African American: 60 mL/min
Potassium: 4.5 mEq/L (ref 3.5–5.3)

## 2012-08-11 MED ORDER — FLUOXETINE HCL (PMDD) 10 MG PO TABS: 10.0000 mg | ORAL_TABLET | Freq: Every day | ORAL | Status: DC

## 2012-08-11 NOTE — Patient Instructions (Signed)
Start probiotic called lactobacillus.

## 2012-08-11 NOTE — Progress Notes (Signed)
  Subjective:    Patient ID: Tonya [name], female    DOB: March 09, 1927, 79 y.o.   MRN: [id number]  HPI  IBS- pt states that that she has not been feeling well since after christmas. she is exp alot of mucous in her stools and asks if this is common with IBS . Was on ABX, amox,  around Chritmas for a tooth extraction. Says hasn't felt like hersefl since then. She was sedated for the procedure.  Will get cramping. Has had more gas too.  No caffeine. Eating a salad did trigger her sxs.    HTN - 126/72 at home this AM.  She does have white coat hypertension.   Had several friends pass away this fall and this has been really stressful for her. Also has caused a question her on the telemetry and has been anxiety provoking. She's not taking the fluoxetine.   Review of Systems     Objective:   Physical Exam  Constitutional: She is oriented to person, place, and time. She appears well-developed and well-nourished.  HENT:  Head: Normocephalic and atraumatic.  Eyes: Conjunctivae and EOM are normal.  Cardiovascular: Normal rate.   Pulmonary/Chest: Effort normal.  Abdominal: Soft. Bowel sounds are normal. She exhibits no distension and no mass. There is tenderness. There is no rebound and no guarding.  Tender in the epigastrum.   Neurological: She is alert and oriented to person, place, and time.  Skin: Skin is dry. No pallor.  Psychiatric: She has a normal mood and affect. Her behavior is normal.          Assessment & Plan:  IBS - reassured her that the mucous stools are normal with IBS.  Recommend get back on a probiotic. I woule really like to get her back on the fluoextine as well. She hasn't been taking it. Explained should help her IBS and her anxiety. I asked if she wanted to get back on the Levbid but she is not sure. Given H.O on IBS and encouraged her to do a food diary as well to help figure out her triggers. The probiotic might be helpful with the mucus that she is  seeing.  Anxiety - I really want her to work on taking the fluoxtine. Followup in 4-6 weeks. I would like to increase her dose to 20 mg to get to more therapeutic level if she tolerates the 10 mg well.  HTN- home blood pressures are well controlled. Continue current regimen.  Tim spent , >50% spent in counseling on IBS, Anxity, HTN.

## 2012-08-15 ENCOUNTER — Other Ambulatory Visit: Payer: Self-pay | Admitting: Family Medicine

## 2012-08-26 LAB — CLOSTRIDIUM DIFFICILE EIA: CDIFTX: NEGATIVE

## 2012-08-29 ENCOUNTER — Other Ambulatory Visit: Payer: Self-pay | Admitting: Family Medicine

## 2012-08-29 ENCOUNTER — Other Ambulatory Visit: Payer: Self-pay

## 2012-08-29 MED ORDER — ESOMEPRAZOLE MAGNESIUM 40 MG PO CPDR: 40.0000 mg | DELAYED_RELEASE_CAPSULE | Freq: Every day | ORAL | Status: DC

## 2012-09-02 ENCOUNTER — Other Ambulatory Visit: Payer: Self-pay | Admitting: Family Medicine

## 2012-09-02 ENCOUNTER — Other Ambulatory Visit: Payer: Self-pay | Admitting: *Deleted

## 2012-09-02 MED ORDER — ALPRAZOLAM 0.25 MG PO TABS: 0.2500 mg | ORAL_TABLET | Freq: Two times a day (BID) | ORAL | Status: DC | PRN

## 2012-09-12 ENCOUNTER — Other Ambulatory Visit: Payer: Self-pay | Admitting: Family Medicine

## 2012-09-22 ENCOUNTER — Encounter: Payer: Self-pay | Admitting: Family Medicine

## 2012-09-22 ENCOUNTER — Ambulatory Visit (INDEPENDENT_AMBULATORY_CARE_PROVIDER_SITE_OTHER): Payer: Medicare Other | Admitting: Family Medicine

## 2012-09-22 VITALS — BP 181/93 | HR 100 | Wt 160.0 lb

## 2012-09-22 DIAGNOSIS — H6120 Impacted cerumen, unspecified ear: Secondary | ICD-10-CM

## 2012-09-22 DIAGNOSIS — H8110 Benign paroxysmal vertigo, unspecified ear: Secondary | ICD-10-CM

## 2012-09-22 DIAGNOSIS — H6123 Impacted cerumen, bilateral: Secondary | ICD-10-CM

## 2012-09-22 DIAGNOSIS — IMO0002 Reserved for concepts with insufficient information to code with codable children: Secondary | ICD-10-CM

## 2012-09-22 MED ORDER — MECLIZINE HCL 25 MG PO TABS: 25.0000 mg | ORAL_TABLET | Freq: Three times a day (TID) | ORAL | Status: DC | PRN

## 2012-09-22 NOTE — Progress Notes (Signed)
  Subjective:    Patient ID: Tonya [name], female    DOB: 17-Apr-1927, 79 y.o.   MRN: [id number]  HPI Says was scrubbing her inside grill on Thursday evening and suddenly felt dizzy and stumbled around the kitchen. Felt like vertigo to her. Has had before.  Says went to lay down. Says not nearly as intense over the weekend. Was very nauseated as well.  Says has a pressure in her head over both eyes.  Mild nasal congestion. No runny nose or URI or allergy sxs.   Says feels her left ear is stopped up, but not pain . Feels tired as well. No fever.  Feels better if moves slowly. Says tried taking an extra xanax.  No speech or mental status changes. She denies any extremity weakness.  Review of Systems     Objective:   Physical Exam  Constitutional: She is oriented to person, place, and time. She appears well-developed and well-nourished.  HENT:  Head: Normocephalic and atraumatic.  Right Ear: External ear normal.  Left Ear: External ear normal.  Nose: Nose normal.  Mouth/Throat: Oropharynx is clear and moist.  The TMs are blocked by cerumen.   Eyes: Conjunctivae and EOM are normal. Pupils are equal, round, and reactive to light.  Neck: Neck supple. No thyromegaly present.  Cardiovascular: Normal rate, regular rhythm and normal heart sounds.   No carotid bruits  Pulmonary/Chest: Effort normal and breath sounds normal. She has no wheezes.  Lymphadenopathy:    She has no cervical adenopathy.  Neurological: She is alert and oriented to person, place, and time.  Positie Diks Hallpike manuver to the left.  Recreated her sxs.   Skin: Skin is warm and dry.  Psychiatric: She has a normal mood and affect.          Assessment & Plan:  Positional vertigo.  - Recommend antivert. Exercises given and reviewed.  Call if not resolving in 2 weeks.   If she's not improving at that point then consider referral for vestibular rehabilitation. Also consider further evaluation for possible stroke if she  notices any new onset of symptoms or if her vertigo suddenly gets worse. She's not had any other neurologic symptoms on exam today. She was mentating normally. Speech was normal as well.  Cerumen impaction-bilateral. Offered to irrigate those today and she declined. She can certainly worsen her dizziness was initially.

## 2012-09-22 NOTE — Patient Instructions (Signed)
Benign Positional Vertigo  Vertigo means you feel like you or your surroundings are moving when they are not. Benign positional vertigo is the most common form of vertigo. Benign means that the cause of your condition is not serious. Benign positional vertigo is more common in older adults.  CAUSES   Benign positional vertigo is the result of an upset in the labyrinth system. This is an area in the middle ear that helps control your balance. This may be caused by a viral infection, head injury, or repetitive motion. However, often no specific cause is found.  SYMPTOMS   Symptoms of benign positional vertigo occur when you move your head or eyes in different directions. Some of the symptoms may include:  · Loss of balance and falls.  · Vomiting.  · Blurred vision.  · Dizziness.  · Nausea.  · Involuntary eye movements (nystagmus).  DIAGNOSIS   Benign positional vertigo is usually diagnosed by physical exam. If the specific cause of your benign positional vertigo is unknown, your caregiver may perform imaging tests, such as magnetic resonance imaging (MRI) or computed tomography (CT).  TREATMENT   Your caregiver may recommend movements or procedures to correct the benign positional vertigo. Medicines such as meclizine, benzodiazepines, and medicines for nausea may be used to treat your symptoms. In rare cases, if your symptoms are caused by certain conditions that affect the inner ear, you may need surgery.  HOME CARE INSTRUCTIONS   · Follow your caregiver's instructions.  · Move slowly. Do not make sudden body or head movements.  · Avoid driving.  · Avoid operating heavy machinery.  · Avoid performing any tasks that would be dangerous to you or others during a vertigo episode.  · Drink enough fluids to keep your urine clear or pale yellow.  SEEK IMMEDIATE MEDICAL CARE IF:   · You develop problems with walking, weakness, numbness, or using your arms, hands, or legs.  · You have difficulty speaking.  · You develop  severe headaches.  · Your nausea or vomiting continues or gets worse.  · You develop visual changes.  · Your family or friends notice any behavioral changes.  · Your condition gets worse.  · You have a fever.  · You develop a stiff neck or sensitivity to light.  MAKE SURE YOU:   · Understand these instructions.  · Will watch your condition.  · Will get help right away if you are not doing well or get worse.  Document Released: 03/12/2006 Document Revised: 08/27/2011 Document Reviewed: 02/22/2011  [employer]® Patient Information ©2013 [employer].

## 2012-10-01 ENCOUNTER — Other Ambulatory Visit: Payer: Self-pay | Admitting: Family Medicine

## 2012-10-30 ENCOUNTER — Other Ambulatory Visit: Payer: Self-pay | Admitting: Family Medicine

## 2012-11-27 ENCOUNTER — Other Ambulatory Visit: Payer: Self-pay | Admitting: Family Medicine

## 2012-12-01 ENCOUNTER — Other Ambulatory Visit: Payer: Self-pay | Admitting: *Deleted

## 2012-12-01 MED ORDER — ALPRAZOLAM 0.25 MG PO TABS: ORAL_TABLET | ORAL | Status: DC

## 2012-12-27 ENCOUNTER — Emergency Department (HOSPITAL_BASED_OUTPATIENT_CLINIC_OR_DEPARTMENT_OTHER)
Admission: EM | Admit: 2012-12-27 | Discharge: 2012-12-27 | Disposition: A | Discharge status: Home / Self Care | Payer: Medicare Other | Attending: Emergency Medicine | Admitting: Emergency Medicine

## 2012-12-27 ENCOUNTER — Encounter (HOSPITAL_BASED_OUTPATIENT_CLINIC_OR_DEPARTMENT_OTHER): Payer: Self-pay | Admitting: *Deleted

## 2012-12-27 DIAGNOSIS — F13239 Sedative, hypnotic or anxiolytic dependence with withdrawal, unspecified: Secondary | ICD-10-CM

## 2012-12-27 DIAGNOSIS — Z8739 Personal history of other diseases of the musculoskeletal system and connective tissue: Secondary | ICD-10-CM | POA: Insufficient documentation

## 2012-12-27 DIAGNOSIS — F410 Panic disorder [episodic paroxysmal anxiety] without agoraphobia: Secondary | ICD-10-CM

## 2012-12-27 DIAGNOSIS — Z8742 Personal history of other diseases of the female genital tract: Secondary | ICD-10-CM | POA: Insufficient documentation

## 2012-12-27 DIAGNOSIS — Z79899 Other long term (current) drug therapy: Secondary | ICD-10-CM | POA: Insufficient documentation

## 2012-12-27 DIAGNOSIS — R5381 Other malaise: Secondary | ICD-10-CM | POA: Insufficient documentation

## 2012-12-27 DIAGNOSIS — F19239 Other psychoactive substance dependence with withdrawal, unspecified: Secondary | ICD-10-CM | POA: Insufficient documentation

## 2012-12-27 DIAGNOSIS — F13939 Sedative, hypnotic or anxiolytic use, unspecified with withdrawal, unspecified: Secondary | ICD-10-CM

## 2012-12-27 DIAGNOSIS — I10 Essential (primary) hypertension: Secondary | ICD-10-CM | POA: Insufficient documentation

## 2012-12-27 DIAGNOSIS — Z8719 Personal history of other diseases of the digestive system: Secondary | ICD-10-CM | POA: Insufficient documentation

## 2012-12-27 DIAGNOSIS — F19939 Other psychoactive substance use, unspecified with withdrawal, unspecified: Secondary | ICD-10-CM | POA: Insufficient documentation

## 2012-12-27 MED ORDER — ALPRAZOLAM 0.25 MG PO TABS: 0.2500 mg | ORAL_TABLET | Freq: Two times a day (BID) | ORAL | Status: DC

## 2012-12-27 MED ORDER — ALPRAZOLAM 0.5 MG PO TABS
0.2500 mg | ORAL_TABLET | Freq: Once | ORAL | Status: AC
  Administered 2012-12-27: 0.25 mg via ORAL
  Filled 2012-12-27: qty 1

## 2012-12-27 NOTE — ED Provider Notes (Signed)
History     This chart was scribed for Osvaldo Human, MD by Jiles Prows, ED Scribe. The patient was seen in room MH12/MH12 and the patient's care was started at 4:22 PM.  CSN: [id number] Arrival date & time 12/27/12  1423  Chief Complaint  Patient presents with  . Nausea   The history is provided by the patient, medical records and a relative. No language interpreter was used.   HPI Comments: Tonya [name] is a 79 y.o. female with a h/o HTN, anxiety, and IBS who presents to the Emergency Department complaining of chronic panic attacks acting up for the past 2 days.  Pt reports she has a h/o chronic panic attacks but has run out of her medication (alprazolam).  She states that she has had panic attacks since yesterday and has only been able to eat a piece of dry toast.  Pt reports that she was so upset over illness of family member that she has been trembling.  Pt denies headache, diaphoresis, fever, chills, vomiting, diarrhea, weakness, cough, SOB and any other pain.  Pt reports that she has an appointment with Dr. Linford Arnold on Monday for her panic attacks.    Pt sees Dr. Linford Arnold for panic attacks  Past Medical History  Diagnosis Date  . IBS (irritable bowel syndrome)   . Hypertension   . Anxiety   . Degenerative disc disease   . Rectocele   . Cystocele    Past Surgical History  Procedure Laterality Date  . Cholecystectomy    . Abdominal hysterectomy      partial   Family History  Problem Relation Age of Onset  . Depression    . GER disease Son   . Depression Son    History  Substance Use Topics  . Smoking status: Never Smoker   . Smokeless tobacco: Not on file  . Alcohol Use: No   OB History   Grav Para Term Preterm Abortions TAB SAB Ect Mult Living                 Review of Systems  Gastrointestinal: Positive for nausea.  Neurological: Positive for weakness.  All other systems reviewed and are negative.   Allergies  Amlodipine and Codeine  Home  Medications   Current Outpatient Rx  Name  Route  Sig  Dispense  Refill  . ALPRAZolam (XANAX) 0.25 MG tablet      TAKE 1 TABLET BY MOUTH TWICE DAILY AS NEEDED FOR SLEEP OR ANIEXTY   60 tablet   0   . esomeprazole (NEXIUM) 40 MG capsule   Oral   Take 1 capsule (40 mg total) by mouth daily before breakfast.   30 capsule   2   . EXPIRED: hyoscyamine (LEVBID) 0.375 MG 12 hr tablet   Oral   Take 1 tablet (0.375 mg total) by mouth every 12 (twelve) hours as needed for cramping.   60 tablet   1   . losartan (COZAAR) 100 MG tablet      TAKE 1 TABLET BY MOUTH EVERY DAILY   30 tablet   4   . meclizine (ANTIVERT) 25 MG tablet   Oral   Take 1 tablet (25 mg total) by mouth 3 (three) times daily as needed.   30 tablet   0    BP 192/97  Pulse 88  Temp(Src) 98.6 F (37 C) (Oral)  Resp 20  Ht 5\' 5"  (1.651 m)  Wt 151 lb 6 oz (68.663 kg)  BMI 25.19 kg/m2  SpO2 98% Physical Exam  Nursing note and vitals reviewed. Constitutional: She is oriented to person, place, and time. She appears well-developed and well-nourished. No distress.  HENT:  Head: Normocephalic and atraumatic.  Eyes: EOM are normal.  Neck: Neck supple. No tracheal deviation present.  Cardiovascular: Normal rate.   Pulmonary/Chest: Effort normal and breath sounds normal. No respiratory distress.  Musculoskeletal: Normal range of motion.  Neurological: She is alert and oriented to person, place, and time.  Skin: Skin is warm and dry.  Psychiatric: She has a normal mood and affect. Her behavior is normal.  Pt is under stress.  She is having panic attacks and withdrawals from xanax.   ED Course  Procedures (including critical care time) DIAGNOSTIC STUDIES: Oxygen Saturation is 98% on RA, normal by my interpretation.    COORDINATION OF CARE: 4:29 PM - Discussed ED treatment with pt at bedside including refilling medication (Alprazolam) and pt agrees.     1. Panic attack   2. Withdrawal from benzodiazepine      I personally performed the services described in this documentation, which was scribed in my presence. The recorded information has been reviewed and is accurate.  Osvaldo Human, M.D.      Carleene Cooper III, MD 12/28/12 [id number]

## 2012-12-27 NOTE — ED Notes (Signed)
Pt states she has been off of her med (Alprazolam) for about a week. Unable to get refilled til Wed. Has recently had to care for son and another family member is sick as well. Pt states she is "overwhelmed". +nausea. Hx IBS and it is ?flaring up again.

## 2012-12-29 ENCOUNTER — Other Ambulatory Visit: Payer: Self-pay | Admitting: Family Medicine

## 2012-12-29 ENCOUNTER — Telehealth: Payer: Self-pay | Admitting: Family Medicine

## 2012-12-29 MED ORDER — ALPRAZOLAM 0.25 MG PO TABS: ORAL_TABLET | ORAL | Status: DC

## 2012-12-29 NOTE — Telephone Encounter (Signed)
Patient is in office with her son and advise that she had to go to the emergency room at our [facility] location because she had a serious Panic and Anxiety attack. Patient states she is out of her meds and refill isn't due until this Wednesday. Request to know if she can have a refill called in today. Thanks

## 2012-12-30 NOTE — Telephone Encounter (Signed)
This has been sent to her pharmacy.Tonya [name] [place]

## 2013-02-25 ENCOUNTER — Other Ambulatory Visit: Payer: Self-pay | Admitting: Family Medicine

## 2013-03-03 ENCOUNTER — Other Ambulatory Visit: Payer: Self-pay | Admitting: Family Medicine

## 2013-03-26 ENCOUNTER — Other Ambulatory Visit: Payer: Self-pay | Admitting: Family Medicine

## 2013-03-31 ENCOUNTER — Encounter: Payer: Self-pay | Admitting: Family Medicine

## 2013-03-31 ENCOUNTER — Ambulatory Visit (INDEPENDENT_AMBULATORY_CARE_PROVIDER_SITE_OTHER): Payer: Medicare Other | Admitting: Family Medicine

## 2013-03-31 ENCOUNTER — Telehealth: Payer: Self-pay | Admitting: *Deleted

## 2013-03-31 VITALS — BP 172/81 | HR 70 | Temp 98.0°F | Wt 149.0 lb

## 2013-03-31 DIAGNOSIS — G8929 Other chronic pain: Secondary | ICD-10-CM

## 2013-03-31 DIAGNOSIS — R1031 Right lower quadrant pain: Secondary | ICD-10-CM

## 2013-03-31 NOTE — Progress Notes (Signed)
Subjective:    Patient ID: Tonya [name], female    DOB: 05/24/1927, 79 y.o.   MRN: [id number]  HPI Having right sided abd pain x 3 days.  No fever, chills, or sweats.  No vomiting. Her pain started in the RUQ and now radiates towards her right LLQ.  Has been having diarrhea but has IBS.  No worsening or alleviating sxs.  No sharp pain. It is more dull and achey.  Took Aleve, not sure if it helped or not.  She has been very stressed out this summer with her family's medical problems. Feels like she is shaking on the inside.  She has been resistant to taking an SSRI or doing therapy.  Her family is very supportive.   Review of Systems BP 172/81  Pulse 70  Temp(Src) 98 F (36.7 C)  Wt 149 lb (67.586 kg)  BMI 24.79 kg/m2    Allergies  Allergen Reactions  . Amlodipine Other (See Comments)    Ankle swelling.   . Codeine     Past Medical History  Diagnosis Date  . IBS (irritable bowel syndrome)   . Hypertension   . Anxiety   . Degenerative disc disease   . Rectocele   . Cystocele     Past Surgical History  Procedure Laterality Date  . Cholecystectomy    . Abdominal hysterectomy      partial    History   Social History  . Marital Status: Widowed    Spouse Name: N/A    Number of Children: N/A  . Years of Education: N/A   Occupational History  . Not on file.   Social History Main Topics  . Smoking status: Never Smoker   . Smokeless tobacco: Not on file  . Alcohol Use: No  . Drug Use: No  . 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   Other Topics Concern  . Not on file   Social History Narrative  . No narrative on file    Family History  Problem Relation Age of Onset  . Depression    . GER disease Son   . Depression Son     Outpatient Encounter Prescriptions as of 03/31/2013  Medication Sig Dispense Refill  . ALPRAZolam (XANAX) 0.25 MG tablet TAKE 1 TABLET  BY MOUTH TWICE DAILY AS NEEDED FOR SLEEP OR ANXIETY  60 tablet  0  . losartan (COZAAR) 100 MG tablet TAKE 1 TABLET BY MOUTH EVERY DAY  30 tablet  3  . hyoscyamine (LEVBID) 0.375 MG 12 hr tablet Take 1 tablet (0.375 mg total) by mouth every 12 (twelve) hours as needed for cramping.  60 tablet  1  . [DISCONTINUED] ALPRAZolam (XANAX) 0.25 MG tablet Take 1 tablet (0.25 mg total) by mouth 2 (two) times daily after a meal.  15 tablet  0  . [DISCONTINUED] ALPRAZolam (XANAX) 0.25 MG tablet TAKE 1 TABLET BY MOUTH TWICE DAILY AS NEEDED FOR SLEEP OR ANIEXTY  60 tablet  1  . [DISCONTINUED] esomeprazole (NEXIUM) 40 MG capsule Take 1 capsule (40 mg total) by mouth daily before breakfast.  30 capsule  2  . [DISCONTINUED] meclizine (ANTIVERT) 25 MG tablet Take 1 tablet (25 mg total) by mouth 3 (three) times daily as needed.  30 tablet  0   No facility-administered encounter medications on file as of 03/31/2013.          Objective:   Physical  Exam  Constitutional: She is oriented to person, place, and time. She appears well-developed and well-nourished.  HENT:  Head: Normocephalic and atraumatic.  Cardiovascular: Normal rate, regular rhythm and normal heart sounds.   Pulmonary/Chest: Effort normal and breath sounds normal.  Abdominal: Soft. Bowel sounds are normal. She exhibits no distension and no mass. There is tenderness. There is no rebound and no guarding.  Very tender in the RLQ.   Musculoskeletal: She exhibits no edema.  Neurological: She is alert and oriented to person, place, and time.  Skin: Skin is warm and dry.  Psychiatric: Her behavior is normal.  Tearful and anxious          Assessment & Plan:  RLQ pain - R/O appendecisit.  Will order CT abd/pel w/ contrast.   May be her IBS if negative. No fever or vomiting.   Will get stat BUN and creatinine this afternoon and schedule her for CT first thing in the morning because of the contrast. I did tell her that if she suddenly gets worse  with fever, increased pain, or vomiting this evening and she is scheduled to the emergency department. If she suddenly gets better by tomorrow morning we'll can always cancel the appointment.   GAD- Discussed again the potential benefit of SSRI. She is still resistant but I really feel it would help her greatly. Explained that she can still use her Xanax as needed. I really think that this would be helpful for her. We have discussed this multiple times in the past we have also discussed therapy in counseling in the past.

## 2013-03-31 NOTE — Telephone Encounter (Signed)
PA obtained for CT ABD/Pelvis with contrast. Auth # [id number]. Helen informed.  Meyer Cory, LPN

## 2013-04-01 ENCOUNTER — Ambulatory Visit (HOSPITAL_BASED_OUTPATIENT_CLINIC_OR_DEPARTMENT_OTHER): Payer: Medicare Other

## 2013-04-22 ENCOUNTER — Other Ambulatory Visit: Payer: Self-pay | Admitting: Family Medicine

## 2013-05-18 ENCOUNTER — Other Ambulatory Visit: Payer: Self-pay | Admitting: Family Medicine

## 2013-05-20 ENCOUNTER — Other Ambulatory Visit: Payer: Self-pay | Admitting: Family Medicine

## 2013-06-16 ENCOUNTER — Other Ambulatory Visit: Payer: Self-pay | Admitting: *Deleted

## 2013-06-16 MED ORDER — ALPRAZOLAM 0.25 MG PO TABS: ORAL_TABLET | ORAL | Status: DC

## 2013-07-07 ENCOUNTER — Other Ambulatory Visit: Payer: Self-pay | Admitting: Family Medicine

## 2013-07-19 ENCOUNTER — Other Ambulatory Visit: Payer: Self-pay | Admitting: Family Medicine

## 2013-07-20 NOTE — Telephone Encounter (Signed)
Are you ok with this? Last seen October 2014

## 2013-08-14 ENCOUNTER — Other Ambulatory Visit: Payer: Self-pay | Admitting: Family Medicine

## 2013-08-15 ENCOUNTER — Other Ambulatory Visit: Payer: Self-pay | Admitting: Family Medicine

## 2013-08-17 ENCOUNTER — Other Ambulatory Visit: Payer: Self-pay | Admitting: Family Medicine

## 2013-08-26 ENCOUNTER — Encounter: Payer: Self-pay | Admitting: Sports Medicine

## 2013-08-26 ENCOUNTER — Ambulatory Visit (INDEPENDENT_AMBULATORY_CARE_PROVIDER_SITE_OTHER): Payer: Medicare Other

## 2013-08-26 ENCOUNTER — Ambulatory Visit (INDEPENDENT_AMBULATORY_CARE_PROVIDER_SITE_OTHER): Payer: Medicare Other | Admitting: Sports Medicine

## 2013-08-26 VITALS — BP 126/70 | HR 75 | Ht 67.0 in | Wt 154.0 lb

## 2013-08-26 DIAGNOSIS — S42309A Unspecified fracture of shaft of humerus, unspecified arm, initial encounter for closed fracture: Secondary | ICD-10-CM

## 2013-08-26 DIAGNOSIS — S42302A Unspecified fracture of shaft of humerus, left arm, initial encounter for closed fracture: Secondary | ICD-10-CM | POA: Insufficient documentation

## 2013-08-26 DIAGNOSIS — IMO0001 Reserved for inherently not codable concepts without codable children: Secondary | ICD-10-CM

## 2013-08-26 MED ORDER — HYDROCODONE-ACETAMINOPHEN 5-325 MG PO TABS: 1.0000 | ORAL_TABLET | Freq: Three times a day (TID) | ORAL | Status: DC | PRN

## 2013-08-26 NOTE — Progress Notes (Signed)
   Subjective:    I'm seeing this patient as a consultation for:  Dr. Nani Gasseratherine Metheney, Dr. Jon[facility]iningoby Sullivan [facility]  CC: Proximal humerus fracture  HPI: This is a pleasant 80 year old female, one week ago she fell out of bed, impacting her night stand, she had immediate pain, swelling, bruising, and inability to use her left arm. He was taken to the emergency department where x-rays showed a proximal humeral fracture, she was placed in a shoulder immobilizer and referred to me for further evaluation and treatment. Pain is improving, she needs a shoulder immobilizer, hydrocodone is effective at this point for her pain. She is not having a numbness or tingling distally.  Past medical history, Surgical history, Family history not pertinant except as noted below, Social history, Allergies, and medications have been entered into the medical record, reviewed, and no changes needed.   Review of Systems: No headache, visual changes, nausea, vomiting, diarrhea, constipation, dizziness, abdominal pain, skin rash, fevers, chills, night sweats, weight loss, swollen lymph nodes, body aches, joint swelling, muscle aches, chest pain, shortness of breath, mood changes, visual or auditory hallucinations.   Objective:   General: Well Developed, well nourished, and in no acute distress.  Neuro/Psych: Alert and oriented x3, extra-ocular muscles intact, able to move all 4 extremities, sensation grossly intact. Skin: Warm and dry, no rashes noted.  Respiratory: Not using accessory muscles, speaking in full sentences, trachea midline.  Cardiovascular: Pulses palpable, no extremity edema. Abdomen: Does not appear distended. Left shoulder: Sling is removed, there is extensive bruising over the elbow, no visible deformity, there is also tenderness to palpation just distal to the neck of the humerus. She is neurovascularly intact distally with good function of her elbow and  wrist.  X-rays were personally reviewed, slightly displaced fracture through the humeral neck  Impression and Recommendations:   This case required medical decision making of moderate complexity.

## 2013-08-26 NOTE — Assessment & Plan Note (Signed)
Occurred one week ago. Continue sling, hydrocodone for pain, x-rays. Return in 2 weeks, x-ray before visit. Anticipate 6-8 weeks total healing time.  I billed a fracture code for this visit, all subsequent visits for this complaint will be "post-op checks" in the global period.

## 2013-09-09 ENCOUNTER — Ambulatory Visit (INDEPENDENT_AMBULATORY_CARE_PROVIDER_SITE_OTHER): Payer: Medicare Other

## 2013-09-09 ENCOUNTER — Encounter: Payer: Self-pay | Admitting: Sports Medicine

## 2013-09-09 ENCOUNTER — Ambulatory Visit (INDEPENDENT_AMBULATORY_CARE_PROVIDER_SITE_OTHER): Payer: Medicare Other | Admitting: Sports Medicine

## 2013-09-09 VITALS — BP 144/80 | HR 83 | Ht 67.0 in | Wt 149.0 lb

## 2013-09-09 DIAGNOSIS — S42302A Unspecified fracture of shaft of humerus, left arm, initial encounter for closed fracture: Secondary | ICD-10-CM

## 2013-09-09 DIAGNOSIS — IMO0001 Reserved for inherently not codable concepts without codable children: Secondary | ICD-10-CM

## 2013-09-09 DIAGNOSIS — S42309A Unspecified fracture of shaft of humerus, unspecified arm, initial encounter for closed fracture: Secondary | ICD-10-CM

## 2013-09-09 NOTE — Assessment & Plan Note (Signed)
Doing well, discussed with shoulder surgery, who recommended further nonoperative treatment. Tylenol as needed for pain, return to see me in 2 weeks.

## 2013-09-09 NOTE — Progress Notes (Signed)
  Subjective: Two-week status post mildly displaced fracture at the humeral neck, still with some pain but significantly better.   Objective: General: Well-developed, well-nourished, and in no acute distress. Left shoulder: Still tender to palpation over the fracture site, she does have approximately 10 of external rotation.  X-rays were reviewed, they show persistence of the fracture without any further displacement as well as new bony callus formation.  Assessment/plan:

## 2013-09-15 ENCOUNTER — Other Ambulatory Visit: Payer: Self-pay | Admitting: Physician Assistant

## 2013-09-23 ENCOUNTER — Ambulatory Visit (INDEPENDENT_AMBULATORY_CARE_PROVIDER_SITE_OTHER): Payer: Medicare Other | Admitting: Sports Medicine

## 2013-09-23 ENCOUNTER — Encounter: Payer: Self-pay | Admitting: Sports Medicine

## 2013-09-23 ENCOUNTER — Ambulatory Visit (INDEPENDENT_AMBULATORY_CARE_PROVIDER_SITE_OTHER): Payer: Medicare Other

## 2013-09-23 VITALS — BP 160/83 | HR 70 | Ht 64.0 in | Wt 154.0 lb

## 2013-09-23 DIAGNOSIS — S42302A Unspecified fracture of shaft of humerus, left arm, initial encounter for closed fracture: Secondary | ICD-10-CM

## 2013-09-23 DIAGNOSIS — S42309A Unspecified fracture of shaft of humerus, unspecified arm, initial encounter for closed fracture: Secondary | ICD-10-CM

## 2013-09-23 MED ORDER — TRAMADOL HCL 50 MG PO TABS: ORAL_TABLET | ORAL | Status: DC

## 2013-09-23 NOTE — Assessment & Plan Note (Signed)
Hydrocodone is a good strong, switching to tramadol. Return in 2 weeks, I do anticipate a total of 6-8 weeks of fracture healing before we start physical therapy for her to regain her range of motion.

## 2013-09-23 NOTE — Progress Notes (Signed)
  Subjective: 5 weeks post fracture of the left humeral neck, doing better.   Objective: General: Well-developed, well-nourished, and in no acute distress. Still very limited motion, also tender to palpation, but much better than before. Neurovascularly intact distally.  X-rays reviewed and do show stability of the fracture with excellent bony callus.  Assessment/plan:

## 2013-09-28 ENCOUNTER — Other Ambulatory Visit: Payer: Self-pay | Admitting: Family Medicine

## 2013-10-07 ENCOUNTER — Ambulatory Visit: Payer: Medicare Other | Admitting: Sports Medicine

## 2013-10-07 ENCOUNTER — Encounter: Payer: Self-pay | Admitting: Sports Medicine

## 2013-10-07 VITALS — BP 156/84 | HR 71 | Ht 64.0 in | Wt 152.0 lb

## 2013-10-07 DIAGNOSIS — S42302A Unspecified fracture of shaft of humerus, left arm, initial encounter for closed fracture: Secondary | ICD-10-CM

## 2013-10-07 NOTE — Assessment & Plan Note (Signed)
At this point we are going to proceed with physical therapy, 3 weeks of passive range of motion, relates active range of motion, and 3 weeks of strengthening. I will see her back in 3 months.

## 2013-10-07 NOTE — Progress Notes (Signed)
  Subjective: 7 weeks post left proximal humeral fracture, continues to improve significantly.   Objective: General: Well-developed, well-nourished, and in no acute distress. Pain is much better I will range of motion continues to be limited as expected, approximately 15 of abduction, 10 of external rotation, 18 of flexion, and 40 of extension.  Assessment/plan:

## 2013-10-12 ENCOUNTER — Other Ambulatory Visit: Payer: Self-pay | Admitting: Family Medicine

## 2013-10-15 ENCOUNTER — Other Ambulatory Visit: Payer: Self-pay | Admitting: Family Medicine

## 2013-10-19 ENCOUNTER — Ambulatory Visit (INDEPENDENT_AMBULATORY_CARE_PROVIDER_SITE_OTHER): Payer: Medicare Other | Admitting: Physical Therapy

## 2013-10-19 DIAGNOSIS — S42309A Unspecified fracture of shaft of humerus, unspecified arm, initial encounter for closed fracture: Secondary | ICD-10-CM

## 2013-10-19 DIAGNOSIS — M25619 Stiffness of unspecified shoulder, not elsewhere classified: Secondary | ICD-10-CM

## 2013-10-19 DIAGNOSIS — M25519 Pain in unspecified shoulder: Secondary | ICD-10-CM

## 2013-10-19 DIAGNOSIS — R293 Abnormal posture: Secondary | ICD-10-CM

## 2013-10-19 DIAGNOSIS — M6281 Muscle weakness (generalized): Secondary | ICD-10-CM

## 2013-10-23 ENCOUNTER — Encounter (INDEPENDENT_AMBULATORY_CARE_PROVIDER_SITE_OTHER): Payer: Medicare Other | Admitting: Physical Therapy

## 2013-10-23 DIAGNOSIS — M6281 Muscle weakness (generalized): Secondary | ICD-10-CM

## 2013-10-23 DIAGNOSIS — M25619 Stiffness of unspecified shoulder, not elsewhere classified: Secondary | ICD-10-CM

## 2013-10-23 DIAGNOSIS — S42309A Unspecified fracture of shaft of humerus, unspecified arm, initial encounter for closed fracture: Secondary | ICD-10-CM

## 2013-10-23 DIAGNOSIS — R293 Abnormal posture: Secondary | ICD-10-CM

## 2013-10-23 DIAGNOSIS — M25519 Pain in unspecified shoulder: Secondary | ICD-10-CM

## 2013-10-28 ENCOUNTER — Encounter (INDEPENDENT_AMBULATORY_CARE_PROVIDER_SITE_OTHER): Payer: Medicare Other | Admitting: Physical Therapy

## 2013-10-28 DIAGNOSIS — M6281 Muscle weakness (generalized): Secondary | ICD-10-CM

## 2013-10-28 DIAGNOSIS — M25519 Pain in unspecified shoulder: Secondary | ICD-10-CM

## 2013-10-28 DIAGNOSIS — R293 Abnormal posture: Secondary | ICD-10-CM

## 2013-10-28 DIAGNOSIS — M25619 Stiffness of unspecified shoulder, not elsewhere classified: Secondary | ICD-10-CM

## 2013-10-28 DIAGNOSIS — S42309A Unspecified fracture of shaft of humerus, unspecified arm, initial encounter for closed fracture: Secondary | ICD-10-CM

## 2013-10-30 ENCOUNTER — Encounter: Payer: Medicare Other | Admitting: Physical Therapy

## 2013-11-02 ENCOUNTER — Encounter: Payer: Medicare Other | Admitting: Physical Therapy

## 2013-11-02 ENCOUNTER — Encounter (INDEPENDENT_AMBULATORY_CARE_PROVIDER_SITE_OTHER): Payer: Medicare Other | Admitting: Physical Therapy

## 2013-11-02 DIAGNOSIS — M6281 Muscle weakness (generalized): Secondary | ICD-10-CM

## 2013-11-02 DIAGNOSIS — R293 Abnormal posture: Secondary | ICD-10-CM

## 2013-11-02 DIAGNOSIS — S42309A Unspecified fracture of shaft of humerus, unspecified arm, initial encounter for closed fracture: Secondary | ICD-10-CM

## 2013-11-02 DIAGNOSIS — M25619 Stiffness of unspecified shoulder, not elsewhere classified: Secondary | ICD-10-CM

## 2013-11-02 DIAGNOSIS — M25519 Pain in unspecified shoulder: Secondary | ICD-10-CM

## 2013-11-05 ENCOUNTER — Encounter (INDEPENDENT_AMBULATORY_CARE_PROVIDER_SITE_OTHER): Payer: Medicare Other | Admitting: Physical Therapy

## 2013-11-05 DIAGNOSIS — S42309A Unspecified fracture of shaft of humerus, unspecified arm, initial encounter for closed fracture: Secondary | ICD-10-CM

## 2013-11-05 DIAGNOSIS — M25519 Pain in unspecified shoulder: Secondary | ICD-10-CM

## 2013-11-05 DIAGNOSIS — M25619 Stiffness of unspecified shoulder, not elsewhere classified: Secondary | ICD-10-CM

## 2013-11-05 DIAGNOSIS — R293 Abnormal posture: Secondary | ICD-10-CM

## 2013-11-05 DIAGNOSIS — M6281 Muscle weakness (generalized): Secondary | ICD-10-CM

## 2013-11-06 ENCOUNTER — Ambulatory Visit (INDEPENDENT_AMBULATORY_CARE_PROVIDER_SITE_OTHER): Payer: Medicare Other | Admitting: Family Medicine

## 2013-11-06 ENCOUNTER — Encounter: Payer: Self-pay | Admitting: Family Medicine

## 2013-11-06 VITALS — BP 129/66 | HR 80 | Wt 146.0 lb

## 2013-11-06 DIAGNOSIS — F411 Generalized anxiety disorder: Secondary | ICD-10-CM

## 2013-11-06 DIAGNOSIS — S42302A Unspecified fracture of shaft of humerus, left arm, initial encounter for closed fracture: Secondary | ICD-10-CM

## 2013-11-06 MED ORDER — ESCITALOPRAM OXALATE 10 MG PO TABS: 10.0000 mg | ORAL_TABLET | Freq: Every day | ORAL | Status: DC

## 2013-11-06 NOTE — Progress Notes (Signed)
   Subjective:    Patient ID: Tonya [name], female    DOB: [birth year], 80 y.o.   MRN: [id number]  HPI Here acutely to discuss mood. Decreased appetite and not sleeping well.  She feel a little dizzy when she wakes up in the AM. Almost like vertigo. + fatigue.  Fractued her humerus on the left upper arm and has been dealing with that. She has weaned down to tramadol and wants to know when can decrease to an NSAID. Her son ricky has had to have an emergency cholecystectomy and this has been really stressful.  She feels anxious and overwhelmed. Just feels shakey.   Review of Systems     Objective:   Physical Exam  Constitutional: She is oriented to person, place, and time. She appears well-developed and well-nourished.  HENT:  Head: Normocephalic and atraumatic.  Cardiovascular: Normal rate, regular rhythm and normal heart sounds.   Pulmonary/Chest: Effort normal and breath sounds normal.  Neurological: She is alert and oriented to person, place, and time.  Skin: Skin is warm and dry.  Psychiatric: She has a normal mood and affect. Her behavior is normal.          Assessment & Plan:  GAD- really needs to be on an SSRI. WE have had this discussion many times. Her son is on fluoxetine and does well and a friend takes Lexapro and is happy with it.  Whe wants to try lexapro. Will start with low dose.  Discussed side effects. F/U in 1 moth. Can still use her xanax prn but would like to wean her down to once a day fi we can as the lexapro takes effect.   Left humurous fracture- will check with Dr. Karie Schwalbe to see if ok to transition ti IBU or Aleve and off the tramadol. She would like to use an NSAID.

## 2013-11-11 ENCOUNTER — Telehealth: Payer: Self-pay | Admitting: Family Medicine

## 2013-11-11 NOTE — Telephone Encounter (Signed)
Call patient: I did seek with Dr. Benjamin Stain. He is okay with you switching back to over-the-counter anti-inflammatories including Aleve for depression or naproxen.

## 2013-11-11 NOTE — Telephone Encounter (Signed)
Pt informed and stated that she usually takes advil.Deno Etienne

## 2013-11-12 ENCOUNTER — Encounter (INDEPENDENT_AMBULATORY_CARE_PROVIDER_SITE_OTHER): Payer: Medicare Other | Admitting: Physical Therapy

## 2013-11-12 ENCOUNTER — Other Ambulatory Visit: Payer: Self-pay | Admitting: *Deleted

## 2013-11-12 DIAGNOSIS — M25619 Stiffness of unspecified shoulder, not elsewhere classified: Secondary | ICD-10-CM

## 2013-11-12 DIAGNOSIS — R293 Abnormal posture: Secondary | ICD-10-CM

## 2013-11-12 DIAGNOSIS — S42309A Unspecified fracture of shaft of humerus, unspecified arm, initial encounter for closed fracture: Secondary | ICD-10-CM

## 2013-11-12 DIAGNOSIS — M25519 Pain in unspecified shoulder: Secondary | ICD-10-CM

## 2013-11-12 DIAGNOSIS — M6281 Muscle weakness (generalized): Secondary | ICD-10-CM

## 2013-11-12 MED ORDER — ALPRAZOLAM 0.25 MG PO TABS: ORAL_TABLET | ORAL | Status: DC

## 2013-11-17 ENCOUNTER — Encounter (INDEPENDENT_AMBULATORY_CARE_PROVIDER_SITE_OTHER): Payer: Medicare Other | Admitting: Physical Therapy

## 2013-11-17 DIAGNOSIS — S42309A Unspecified fracture of shaft of humerus, unspecified arm, initial encounter for closed fracture: Secondary | ICD-10-CM

## 2013-11-17 DIAGNOSIS — R293 Abnormal posture: Secondary | ICD-10-CM

## 2013-11-17 DIAGNOSIS — M25519 Pain in unspecified shoulder: Secondary | ICD-10-CM

## 2013-11-17 DIAGNOSIS — M6281 Muscle weakness (generalized): Secondary | ICD-10-CM

## 2013-11-17 DIAGNOSIS — M25619 Stiffness of unspecified shoulder, not elsewhere classified: Secondary | ICD-10-CM

## 2013-11-19 ENCOUNTER — Encounter (INDEPENDENT_AMBULATORY_CARE_PROVIDER_SITE_OTHER): Payer: Medicare Other | Admitting: Physical Therapy

## 2013-11-19 DIAGNOSIS — M6281 Muscle weakness (generalized): Secondary | ICD-10-CM

## 2013-11-19 DIAGNOSIS — R293 Abnormal posture: Secondary | ICD-10-CM

## 2013-11-19 DIAGNOSIS — S42309A Unspecified fracture of shaft of humerus, unspecified arm, initial encounter for closed fracture: Secondary | ICD-10-CM

## 2013-11-19 DIAGNOSIS — M25519 Pain in unspecified shoulder: Secondary | ICD-10-CM

## 2013-11-19 DIAGNOSIS — M25619 Stiffness of unspecified shoulder, not elsewhere classified: Secondary | ICD-10-CM

## 2013-11-25 ENCOUNTER — Encounter: Payer: Medicare Other | Admitting: Physical Therapy

## 2013-11-27 DIAGNOSIS — R293 Abnormal posture: Secondary | ICD-10-CM

## 2013-11-27 DIAGNOSIS — M6281 Muscle weakness (generalized): Secondary | ICD-10-CM

## 2013-11-27 DIAGNOSIS — M25619 Stiffness of unspecified shoulder, not elsewhere classified: Secondary | ICD-10-CM

## 2013-11-27 DIAGNOSIS — S42309A Unspecified fracture of shaft of humerus, unspecified arm, initial encounter for closed fracture: Secondary | ICD-10-CM

## 2013-11-27 DIAGNOSIS — M25519 Pain in unspecified shoulder: Secondary | ICD-10-CM

## 2013-12-01 ENCOUNTER — Encounter (INDEPENDENT_AMBULATORY_CARE_PROVIDER_SITE_OTHER): Payer: Medicare Other | Admitting: Physical Therapy

## 2013-12-01 DIAGNOSIS — M25519 Pain in unspecified shoulder: Secondary | ICD-10-CM

## 2013-12-01 DIAGNOSIS — S42309A Unspecified fracture of shaft of humerus, unspecified arm, initial encounter for closed fracture: Secondary | ICD-10-CM

## 2013-12-01 DIAGNOSIS — R293 Abnormal posture: Secondary | ICD-10-CM

## 2013-12-01 DIAGNOSIS — M25619 Stiffness of unspecified shoulder, not elsewhere classified: Secondary | ICD-10-CM

## 2013-12-01 DIAGNOSIS — M6281 Muscle weakness (generalized): Secondary | ICD-10-CM

## 2013-12-04 ENCOUNTER — Encounter (INDEPENDENT_AMBULATORY_CARE_PROVIDER_SITE_OTHER): Payer: Medicare Other

## 2013-12-04 ENCOUNTER — Ambulatory Visit (INDEPENDENT_AMBULATORY_CARE_PROVIDER_SITE_OTHER): Payer: Medicare Other | Admitting: Family Medicine

## 2013-12-04 ENCOUNTER — Encounter: Payer: Self-pay | Admitting: Family Medicine

## 2013-12-04 VITALS — BP 171/80 | HR 71 | Wt 150.0 lb

## 2013-12-04 DIAGNOSIS — S42302D Unspecified fracture of shaft of humerus, left arm, subsequent encounter for fracture with routine healing: Secondary | ICD-10-CM

## 2013-12-04 DIAGNOSIS — S42309A Unspecified fracture of shaft of humerus, unspecified arm, initial encounter for closed fracture: Secondary | ICD-10-CM

## 2013-12-04 DIAGNOSIS — F411 Generalized anxiety disorder: Secondary | ICD-10-CM

## 2013-12-04 DIAGNOSIS — M79609 Pain in unspecified limb: Secondary | ICD-10-CM

## 2013-12-04 DIAGNOSIS — M25619 Stiffness of unspecified shoulder, not elsewhere classified: Secondary | ICD-10-CM

## 2013-12-04 DIAGNOSIS — M6281 Muscle weakness (generalized): Secondary | ICD-10-CM

## 2013-12-04 DIAGNOSIS — M25519 Pain in unspecified shoulder: Secondary | ICD-10-CM

## 2013-12-04 DIAGNOSIS — I10 Essential (primary) hypertension: Secondary | ICD-10-CM

## 2013-12-04 DIAGNOSIS — R293 Abnormal posture: Secondary | ICD-10-CM

## 2013-12-04 DIAGNOSIS — M79622 Pain in left upper arm: Secondary | ICD-10-CM

## 2013-12-04 NOTE — Progress Notes (Addendum)
   Subjective:    Patient ID: Tonya [name], female    DOB: 08-01-1926, [age] y.o.   MRN: [id number]  HPI  GAD - didn't start the medication bc saw the side effects listed suicide so was scared to take it while she was out of town. She is now back home. [place] going [place] she's going to stay home and so she is willing to try the medication.  Left arm fracture. She has had 4 weeks of threapy and is making progress. Says it is painful.  Just finished therapy right before her visit today. She has 4 more weeks. She says she does feel very sore today but feels like she is making some good progress. She has a followup with Dr. Benjamin Stainhekkekandam in July..    Hypertension- Pt denies chest pain, SOB, dizziness, or heart palpitations.  Taking meds as directed w/o problems.  Denies medication side effects.     Review of Systems     Objective:   Physical Exam  Constitutional: She is oriented to person, place, and time. She appears well-developed and well-nourished.  HENT:  Head: Normocephalic and atraumatic.  Eyes: Conjunctivae and EOM are normal.  Cardiovascular: Normal rate.   Pulmonary/Chest: Effort normal.  Neurological: She is alert and oriented to person, place, and time.  Skin: Skin is dry. No pallor.  Psychiatric: She has a normal mood and affect. Her behavior is normal.          Assessment & Plan:  GAD- strongly recommend start the medication. We discussed potential side effects and the risk of suicide. It was typically seen in teenagers and people in her early 4320s. Certainly if she starts to have any thoughts that are negative or suicidal she is to stop the medication immediately and contact us but I think the risk is extraordinarily low. She did fill the medication and actually has it with her today. Encouraged her to start it and followup with me in 3 weeks.  Left arm fracture-she's doing well with rehabilitation. Continue full 8 week course. Keep followup with Dr.  Benjamin Stainhekkekandam in July.  Hypertension-blood pressure is up today. It looks fantastic the last 2 times she was here. She is a little bit of pain today after just finishing up her rehabilitation appointment this morning. Most likely secondary to pain. Will recheck at followup in 3 weeks.

## 2013-12-09 ENCOUNTER — Encounter (INDEPENDENT_AMBULATORY_CARE_PROVIDER_SITE_OTHER): Payer: Medicare Other | Admitting: Physical Therapy

## 2013-12-09 DIAGNOSIS — M25619 Stiffness of unspecified shoulder, not elsewhere classified: Secondary | ICD-10-CM

## 2013-12-09 DIAGNOSIS — M6281 Muscle weakness (generalized): Secondary | ICD-10-CM

## 2013-12-09 DIAGNOSIS — S42309A Unspecified fracture of shaft of humerus, unspecified arm, initial encounter for closed fracture: Secondary | ICD-10-CM

## 2013-12-09 DIAGNOSIS — M25519 Pain in unspecified shoulder: Secondary | ICD-10-CM

## 2013-12-14 ENCOUNTER — Other Ambulatory Visit: Payer: Self-pay | Admitting: Sports Medicine

## 2013-12-14 MED ORDER — ALPRAZOLAM 0.25 MG PO TABS: ORAL_TABLET | ORAL | Status: DC

## 2013-12-15 ENCOUNTER — Other Ambulatory Visit: Payer: Self-pay

## 2013-12-25 ENCOUNTER — Ambulatory Visit: Payer: Medicare Other | Admitting: Family Medicine

## 2013-12-25 DIAGNOSIS — Z0289 Encounter for other administrative examinations: Secondary | ICD-10-CM

## 2014-01-06 ENCOUNTER — Ambulatory Visit (INDEPENDENT_AMBULATORY_CARE_PROVIDER_SITE_OTHER): Payer: Medicare Other | Admitting: Sports Medicine

## 2014-01-06 ENCOUNTER — Ambulatory Visit (INDEPENDENT_AMBULATORY_CARE_PROVIDER_SITE_OTHER): Payer: Medicare Other

## 2014-01-06 ENCOUNTER — Encounter: Payer: Self-pay | Admitting: Sports Medicine

## 2014-01-06 VITALS — BP 154/79 | HR 68 | Ht 68.0 in | Wt 149.0 lb

## 2014-01-06 DIAGNOSIS — S42302D Unspecified fracture of shaft of humerus, left arm, subsequent encounter for fracture with routine healing: Secondary | ICD-10-CM

## 2014-01-06 DIAGNOSIS — S42309D Unspecified fracture of shaft of humerus, unspecified arm, subsequent encounter for fracture with routine healing: Secondary | ICD-10-CM

## 2014-01-06 DIAGNOSIS — S42309A Unspecified fracture of shaft of humerus, unspecified arm, initial encounter for closed fracture: Secondary | ICD-10-CM

## 2014-01-06 MED ORDER — MELOXICAM 15 MG PO TABS: ORAL_TABLET | ORAL | Status: DC

## 2014-01-06 NOTE — Progress Notes (Signed)
  Subjective:    CC: Followup  HPI: Left humeral fracture: Proximal, approximately 5 months out, doing very well, only has a little pain with extremes of range of motion, and is continuing to work with physical therapy. Pain is at the joint line, worse with bad weather and stiff in the mornings.  Past medical history, Surgical history, Family history not pertinant except as noted below, Social history, Allergies, and medications have been entered into the medical record, reviewed, and no changes needed.   Review of Systems: No fevers, chills, night sweats, weight loss, chest pain, or shortness of breath.   Objective:    General: Well Developed, well nourished, and in no acute distress.  Neuro: Alert and oriented x3, extra-ocular muscles intact, sensation grossly intact.  HEENT: Normocephalic, atraumatic, pupils equal round reactive to light, neck supple, no masses, no lymphadenopathy, thyroid nonpalpable.  Skin: Warm and dry, no rashes. Cardiac: Regular rate and rhythm, no murmurs rubs or gallops, no lower extremity edema.  Respiratory: Clear to auscultation bilaterally. Not using accessory muscles, speaking in full sentences. Left shoulder: External rotation to 30, flexion to 30, extension to 60, abduction to 40.  X-ray is personally reviewed and do show excellent congruity of the glenohumeral joint with some glenohumeral generative changes. Fracture appears well-healed.  Impression and Recommendations:

## 2014-01-06 NOTE — Assessment & Plan Note (Signed)
Doing very well approximately 5 months post proximal humerus fracture. Continue physical therapy, at this point we will proceed with active range of motion and strengthening. She can use meloxicam for pain, I would like another set of x-rays. I like to see her back in 4-6 weeks if she continues to have some pain we can do a glenohumeral joint injection. There is likely some posttraumatic osteoarthritis at this time.

## 2014-01-11 ENCOUNTER — Encounter (INDEPENDENT_AMBULATORY_CARE_PROVIDER_SITE_OTHER): Payer: Medicare Other | Admitting: Physical Therapy

## 2014-01-11 DIAGNOSIS — S42309A Unspecified fracture of shaft of humerus, unspecified arm, initial encounter for closed fracture: Secondary | ICD-10-CM | POA: Diagnosis not present

## 2014-01-11 DIAGNOSIS — M25619 Stiffness of unspecified shoulder, not elsewhere classified: Secondary | ICD-10-CM | POA: Diagnosis not present

## 2014-01-11 DIAGNOSIS — R293 Abnormal posture: Secondary | ICD-10-CM

## 2014-01-11 DIAGNOSIS — M6281 Muscle weakness (generalized): Secondary | ICD-10-CM

## 2014-01-11 DIAGNOSIS — M25519 Pain in unspecified shoulder: Secondary | ICD-10-CM | POA: Diagnosis not present

## 2014-01-15 ENCOUNTER — Encounter (INDEPENDENT_AMBULATORY_CARE_PROVIDER_SITE_OTHER): Payer: Medicare Other

## 2014-01-15 DIAGNOSIS — R293 Abnormal posture: Secondary | ICD-10-CM

## 2014-01-15 DIAGNOSIS — M25519 Pain in unspecified shoulder: Secondary | ICD-10-CM

## 2014-01-15 DIAGNOSIS — S42309A Unspecified fracture of shaft of humerus, unspecified arm, initial encounter for closed fracture: Secondary | ICD-10-CM

## 2014-01-15 DIAGNOSIS — M6281 Muscle weakness (generalized): Secondary | ICD-10-CM

## 2014-01-15 DIAGNOSIS — M25619 Stiffness of unspecified shoulder, not elsewhere classified: Secondary | ICD-10-CM

## 2014-01-28 ENCOUNTER — Other Ambulatory Visit: Payer: Self-pay | Admitting: Family Medicine

## 2014-02-11 ENCOUNTER — Other Ambulatory Visit: Payer: Self-pay | Admitting: Sports Medicine

## 2014-02-15 ENCOUNTER — Other Ambulatory Visit: Payer: Self-pay | Admitting: Family Medicine

## 2014-02-23 ENCOUNTER — Ambulatory Visit (INDEPENDENT_AMBULATORY_CARE_PROVIDER_SITE_OTHER): Payer: Medicare Other | Admitting: Family Medicine

## 2014-02-23 ENCOUNTER — Encounter: Payer: Self-pay | Admitting: Family Medicine

## 2014-02-23 VITALS — BP 159/80 | HR 83 | Wt 151.0 lb

## 2014-02-23 DIAGNOSIS — I10 Essential (primary) hypertension: Secondary | ICD-10-CM

## 2014-02-23 DIAGNOSIS — F411 Generalized anxiety disorder: Secondary | ICD-10-CM

## 2014-02-23 DIAGNOSIS — Z23 Encounter for immunization: Secondary | ICD-10-CM

## 2014-02-23 DIAGNOSIS — Z780 Asymptomatic menopausal state: Secondary | ICD-10-CM

## 2014-02-23 MED ORDER — ALPRAZOLAM 0.25 MG PO TABS: ORAL_TABLET | ORAL | Status: DC

## 2014-02-23 NOTE — Progress Notes (Signed)
   Subjective:    Patient ID: Tonya [name], female    DOB: [birth year], 80 y.o.   MRN: [id number]  HPI F/u depression/anxiety - She never started the Lexapro.  Phq-9= 2 and  gad-7= 3.  Pt reports that she has not started taking the lexapro she does not feel that she is depressed.  Her IBS. Her grandson was in a wreck recently, and this was stressful. Fortunately he was okay. She feels like overall her mood is actually been really good..    Hypertension- Pt denies chest pain, SOB, dizziness, or heart palpitations.  Taking meds as directed w/o problems.  Denies medication side effects.    Review of Systems     Objective:   Physical Exam  Constitutional: She is oriented to person, place, and time. She appears well-developed and well-nourished.  HENT:  Head: Normocephalic and atraumatic.  Cardiovascular: Normal rate, regular rhythm and normal heart sounds.   Pulmonary/Chest: Effort normal and breath sounds normal.  Neurological: She is alert and oriented to person, place, and time.  Skin: Skin is warm and dry.  Psychiatric: She has a normal mood and affect. Her behavior is normal.          Assessment & Plan:  GAD - well controlled.  Still encouraged her to consider taking Lexapro. Now her for several years and her mood fluctuates. She still overall is a very anxious person I really think would benefit from being on a controller.  HTN - hx of white coat hypertension.  Continue to monitor blood pressures at home which seemed to be well-controlled.  Declined flu shot today.    Given Prenvar 13 today.

## 2014-02-25 ENCOUNTER — Other Ambulatory Visit: Payer: Self-pay | Admitting: Family Medicine

## 2014-03-24 ENCOUNTER — Other Ambulatory Visit: Payer: Medicare Other

## 2014-03-25 ENCOUNTER — Other Ambulatory Visit: Payer: Self-pay | Admitting: Family Medicine

## 2014-04-07 ENCOUNTER — Other Ambulatory Visit: Payer: Medicare Other

## 2014-04-22 ENCOUNTER — Other Ambulatory Visit: Payer: Self-pay | Admitting: Family Medicine

## 2014-04-23 ENCOUNTER — Other Ambulatory Visit: Payer: Self-pay

## 2014-04-23 MED ORDER — ALPRAZOLAM 0.25 MG PO TABS: ORAL_TABLET | ORAL | Status: DC

## 2014-04-28 ENCOUNTER — Other Ambulatory Visit: Payer: Self-pay | Admitting: Family Medicine

## 2014-05-21 ENCOUNTER — Other Ambulatory Visit: Payer: Self-pay

## 2014-05-30 ENCOUNTER — Other Ambulatory Visit: Payer: Self-pay | Admitting: Family Medicine

## 2014-05-31 ENCOUNTER — Ambulatory Visit: Payer: Medicare Other | Admitting: Family Medicine

## 2014-06-07 ENCOUNTER — Ambulatory Visit (INDEPENDENT_AMBULATORY_CARE_PROVIDER_SITE_OTHER): Payer: Medicare Other | Admitting: Family Medicine

## 2014-06-07 ENCOUNTER — Encounter: Payer: Self-pay | Admitting: Family Medicine

## 2014-06-07 VITALS — BP 148/80 | HR 72 | Ht 68.0 in | Wt 157.0 lb

## 2014-06-07 DIAGNOSIS — K589 Irritable bowel syndrome without diarrhea: Secondary | ICD-10-CM

## 2014-06-07 DIAGNOSIS — Z13220 Encounter for screening for lipoid disorders: Secondary | ICD-10-CM

## 2014-06-07 DIAGNOSIS — I10 Essential (primary) hypertension: Secondary | ICD-10-CM

## 2014-06-07 DIAGNOSIS — J069 Acute upper respiratory infection, unspecified: Secondary | ICD-10-CM

## 2014-06-07 MED ORDER — DICYCLOMINE HCL 10 MG PO CAPS: 10.0000 mg | ORAL_CAPSULE | Freq: Three times a day (TID) | ORAL | Status: DC

## 2014-06-07 MED ORDER — AMOXICILLIN-POT CLAVULANATE 875-125 MG PO TABS: 1.0000 | ORAL_TABLET | Freq: Two times a day (BID) | ORAL | Status: DC

## 2014-06-07 NOTE — Progress Notes (Signed)
   Subjective:    Patient ID: Tonya [place] A [name], female    DOB: 1927-03-13, 80 y.o.   MRN: [id number]  HPI Coughing and sneezing x 3 days.  No fever, chills, or sweats.  Fatigued. runny nose and post nasal drip.  Mild ST initially. No has a HA.   IBS - After Thanksgiving her IBS has been flaring.  Has had a lot of mucous with BM. No blood in her stool. Uses kaypectac and that helps sooth her stomach.  Using Advill prn. Taking about 2 a day for her GI sxs.     Hypertension- Pt denies chest pain, SOB, dizziness, or heart palpitations.  Taking meds as directed w/o problems.  Denies medication side effects.      Review of Systems     Objective:   Physical Exam  Constitutional: She is oriented to person, place, and time. She appears well-developed and well-nourished.  HENT:  Head: Normocephalic and atraumatic.  Right Ear: External ear normal.  Left Ear: External ear normal.  Nose: Nose normal.  Mouth/Throat: Oropharynx is clear and moist.  TMs and canals are clear.   Eyes: Conjunctivae and EOM are normal. Pupils are equal, round, and reactive to light.  Neck: Neck supple. No thyromegaly present.  Cardiovascular: Normal rate, regular rhythm and normal heart sounds.   Pulmonary/Chest: Effort normal and breath sounds normal. She has no wheezes.  Lymphadenopathy:    She has no cervical adenopathy.  Neurological: She is alert and oriented to person, place, and time.  Skin: Skin is warm and dry.  Psychiatric: She has a normal mood and affect.          Assessment & Plan:  IBS- Will try an antispasmodic. Trial of bentyl.  To use TID AC PRN.  F/U in 6-8 weeks  URI - likely viral. Will give her rx to fill for sinusitis if gets worse of if not better by next weekend as we weill be closed with the Holidays.   HTN - home BPs well controlled. Repeat BP a little better.  Due for CMP and lipids.

## 2014-06-23 ENCOUNTER — Other Ambulatory Visit: Payer: Self-pay | Admitting: Family Medicine

## 2014-06-28 ENCOUNTER — Telehealth: Payer: Self-pay

## 2014-06-28 NOTE — Telephone Encounter (Signed)
Tonya [name] complains of low back pain and right buttock pain for 7 days. She believes this is sciatica pain. She does not have a ride to come in for a visit. Please advise.

## 2014-06-29 ENCOUNTER — Telehealth: Payer: Self-pay | Admitting: Family Medicine

## 2014-06-29 MED ORDER — PREDNISONE 20 MG PO TABS: 40.0000 mg | ORAL_TABLET | Freq: Every day | ORAL | Status: DC

## 2014-06-29 NOTE — Telephone Encounter (Signed)
I will call in 5 days of prednisone but if she's not getting better she will need to come in.

## 2014-06-29 NOTE — Telephone Encounter (Signed)
Pt called again. She states pain is no longer in buttocks but soley in lower back and it is hindering her movements.  Thank you

## 2014-06-29 NOTE — Telephone Encounter (Signed)
Patient advised of recommendations.  

## 2014-07-05 ENCOUNTER — Telehealth: Payer: Self-pay | Admitting: Family Medicine

## 2014-07-05 ENCOUNTER — Ambulatory Visit: Payer: Self-pay | Admitting: Family Medicine

## 2014-07-05 ENCOUNTER — Ambulatory Visit (INDEPENDENT_AMBULATORY_CARE_PROVIDER_SITE_OTHER): Payer: Commercial Managed Care - HMO

## 2014-07-05 ENCOUNTER — Encounter: Payer: Self-pay | Admitting: Family Medicine

## 2014-07-05 ENCOUNTER — Ambulatory Visit: Payer: Self-pay | Admitting: Physician Assistant

## 2014-07-05 ENCOUNTER — Ambulatory Visit (INDEPENDENT_AMBULATORY_CARE_PROVIDER_SITE_OTHER): Payer: Commercial Managed Care - HMO | Admitting: Family Medicine

## 2014-07-05 VITALS — BP 188/83 | HR 77

## 2014-07-05 DIAGNOSIS — M545 Low back pain, unspecified: Secondary | ICD-10-CM

## 2014-07-05 DIAGNOSIS — M47816 Spondylosis without myelopathy or radiculopathy, lumbar region: Secondary | ICD-10-CM | POA: Diagnosis not present

## 2014-07-05 MED ORDER — HYDROCODONE-ACETAMINOPHEN 5-325 MG PO TABS: 1.0000 | ORAL_TABLET | Freq: Four times a day (QID) | ORAL | Status: DC | PRN

## 2014-07-05 MED ORDER — TRAMADOL HCL 50 MG PO TABS: 50.0000 mg | ORAL_TABLET | Freq: Three times a day (TID) | ORAL | Status: DC | PRN

## 2014-07-05 NOTE — Addendum Note (Signed)
Addended by: Laren BoomHOMMEL, Liller Yohn on: 07/05/2014 04:59 PM   Modules accepted: Orders

## 2014-07-05 NOTE — Telephone Encounter (Signed)
Sue Lushndrea, Rx placed in in-box ready for pickup/faxing. (See result note from today)

## 2014-07-05 NOTE — Progress Notes (Signed)
CC: [place] A Jeanella [name] is a 81 y.o. female is here for Back Pain   Subjective: HPI:  Midline low back pain just above the tailbone that has been present for the past 7 days. It came on abruptly while she was walking around doing household chores. She denies any recent falls or trauma nor overexertion. Pain is been persistent and its moderate to severe in severity and that she is lying flat at that time it is nonexistent. Any activity other than lying flat makes symptoms worse. Pain is bad enough to where she is unwilling to take a shower due to the degree of pain. No benefit from Tylenol, no benefit from 5 days of prednisone. Slightly improved with ice however only temporarily. No other interventions as of yet. Pain is radiating slightly into the right buttock but no further than the buttock. Denies any motor or sensory disturbances in the lower extremity, bowel or bladder incontinence. Denies abdominal pain, joint pain elsewhere, nor genitourinary complaints.   Review Of Systems Outlined In HPI  Past Medical History  Diagnosis Date  . IBS (irritable bowel syndrome)   . Hypertension   . Anxiety   . Degenerative disc disease   . Rectocele   . Cystocele     Past Surgical History  Procedure Laterality Date  . Cholecystectomy    . Abdominal hysterectomy      partial   Family History  Problem Relation Age of Onset  . Depression    . GER disease Son   . Depression Son     History   Social History  . Marital Status: Widowed    Spouse Name: N/A    Number of Children: N/A  . Years of Education: N/A   Occupational History  . Not on file.   Social History Main Topics  . Smoking status: Never Smoker   . Smokeless tobacco: Not on file  . Alcohol Use: No  . Drug Use: No  . 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   Other Topics Concern  . Not on file   Social History  Narrative     Objective: BP 188/83 mmHg  Pulse 77  Wt   General: Alert and Oriented, No Acute Distress HEENT: Pupils equal, round, reactive to light. Conjunctivae clear.   Lungs: Clear comfortable work of breathing Cardiac: Regular rate and rhythm.  Back: No midline spinous process tenderness in the lumbar region, no pain with palpation of the paraspinal musculature in the lumbar region nor with palpation of either SI joint. Full range of motion and strength of both lower extremities. Unwilling to stand up. Extremities: No peripheral edema.  Strong peripheral pulses.  Mental Status: No depression, anxiety, nor agitation. Skin: Warm and dry.  Assessment & Plan: [place] was seen today for back pain.  Diagnoses and associated orders for this visit:  Midline low back pain without sciatica - HYDROcodone-acetaminophen (NORCO/VICODIN) 5-325 MG per tablet; Take 1 tablet by mouth every 6 (six) hours as needed for moderate pain. - DG Lumbar Spine Complete; Future - Ambulatory referral to Physical Therapy    An x-ray was obtained to rule out a compression fracture which fortunately there was no sign of a fracture. Provided with hydrocodone for pain control and will refer to physical therapy.  Return if symptoms worsen or fail to improve.

## 2014-07-05 NOTE — Telephone Encounter (Signed)
rx faxed pt notified.

## 2014-07-06 ENCOUNTER — Telehealth: Payer: Self-pay | Admitting: Family Medicine

## 2014-07-06 NOTE — Telephone Encounter (Signed)
Tonya AntiguaDebra [name] called on behalf of [name]. She has sciatic nerve issues and she was told that we get her set up so that she can do stretches through Home Health. So far they have not heard anything back from us on this. Please contact Tonya at [phone]

## 2014-07-12 DIAGNOSIS — M5136 Other intervertebral disc degeneration, lumbar region: Secondary | ICD-10-CM | POA: Diagnosis not present

## 2014-07-12 DIAGNOSIS — I10 Essential (primary) hypertension: Secondary | ICD-10-CM | POA: Diagnosis not present

## 2014-07-12 DIAGNOSIS — F419 Anxiety disorder, unspecified: Secondary | ICD-10-CM | POA: Diagnosis not present

## 2014-07-12 DIAGNOSIS — K589 Irritable bowel syndrome without diarrhea: Secondary | ICD-10-CM | POA: Diagnosis not present

## 2014-07-13 ENCOUNTER — Telehealth: Payer: Self-pay | Admitting: Family Medicine

## 2014-07-13 NOTE — Telephone Encounter (Signed)
Tonya AntiguaDebra [name] called on behalf of Tonya [name]. Tonya [name] is concerned that she has not heard anything from us with regards to her getting set up with home health to do stretches due to her sciatic nerve issues. Her phone # is, [phone].  Thank you.

## 2014-07-14 DIAGNOSIS — M5136 Other intervertebral disc degeneration, lumbar region: Secondary | ICD-10-CM | POA: Diagnosis not present

## 2014-07-14 DIAGNOSIS — F419 Anxiety disorder, unspecified: Secondary | ICD-10-CM | POA: Diagnosis not present

## 2014-07-14 DIAGNOSIS — I10 Essential (primary) hypertension: Secondary | ICD-10-CM | POA: Diagnosis not present

## 2014-07-14 DIAGNOSIS — K589 Irritable bowel syndrome without diarrhea: Secondary | ICD-10-CM | POA: Diagnosis not present

## 2014-07-14 NOTE — Telephone Encounter (Signed)
Pt stated that the therapist has been coming out since Monday.Loralee PacasBarkley, [name] [place]

## 2014-07-15 NOTE — Telephone Encounter (Signed)
Tonya AntiguaDebra [name] called on behalf of Tonya [name].  Tonya [name] has sciatic nerve issues and she was told that we would get her set up so that she can do stretch exercises through a Home Health agency.  Tonya's phone number is, [phone].

## 2014-07-16 DIAGNOSIS — M5136 Other intervertebral disc degeneration, lumbar region: Secondary | ICD-10-CM | POA: Diagnosis not present

## 2014-07-16 DIAGNOSIS — K589 Irritable bowel syndrome without diarrhea: Secondary | ICD-10-CM | POA: Diagnosis not present

## 2014-07-16 DIAGNOSIS — I10 Essential (primary) hypertension: Secondary | ICD-10-CM | POA: Diagnosis not present

## 2014-07-16 DIAGNOSIS — F419 Anxiety disorder, unspecified: Secondary | ICD-10-CM | POA: Diagnosis not present

## 2014-07-16 NOTE — Telephone Encounter (Signed)
Pt has been out to see pt 2x.Tonya PacasBarkley, Tonya [place]

## 2014-07-19 ENCOUNTER — Telehealth: Payer: Self-pay

## 2014-07-19 DIAGNOSIS — M5136 Other intervertebral disc degeneration, lumbar region: Secondary | ICD-10-CM | POA: Diagnosis not present

## 2014-07-19 DIAGNOSIS — F419 Anxiety disorder, unspecified: Secondary | ICD-10-CM | POA: Diagnosis not present

## 2014-07-19 DIAGNOSIS — K589 Irritable bowel syndrome without diarrhea: Secondary | ICD-10-CM | POA: Diagnosis not present

## 2014-07-19 DIAGNOSIS — I10 Essential (primary) hypertension: Secondary | ICD-10-CM | POA: Diagnosis not present

## 2014-07-19 NOTE — Telephone Encounter (Signed)
[place] called and reports her daughter, Tonya [name], is sick. She wants Dr Linford ArnoldMetheney to call her in something so she doesn't get sick. I advised her that we don't call in medications for patient's without symptoms or being seen. She said she has had sciatica for a few weeks and doesn't believe she could handle being sick. She wanted me to send message to Dr Linford ArnoldMetheney anyway.

## 2014-07-19 NOTE — Telephone Encounter (Signed)
We did see Tonya [name] today.  She has a virus.  So taking antibiotic will not help her prevent or treat illness.

## 2014-07-19 NOTE — Telephone Encounter (Signed)
Patient advised.

## 2014-07-21 DIAGNOSIS — M5136 Other intervertebral disc degeneration, lumbar region: Secondary | ICD-10-CM | POA: Diagnosis not present

## 2014-07-21 DIAGNOSIS — F419 Anxiety disorder, unspecified: Secondary | ICD-10-CM | POA: Diagnosis not present

## 2014-07-21 DIAGNOSIS — I10 Essential (primary) hypertension: Secondary | ICD-10-CM | POA: Diagnosis not present

## 2014-07-21 DIAGNOSIS — K589 Irritable bowel syndrome without diarrhea: Secondary | ICD-10-CM | POA: Diagnosis not present

## 2014-07-23 ENCOUNTER — Other Ambulatory Visit: Payer: Self-pay | Admitting: Family Medicine

## 2014-07-23 DIAGNOSIS — K589 Irritable bowel syndrome without diarrhea: Secondary | ICD-10-CM | POA: Diagnosis not present

## 2014-07-23 DIAGNOSIS — I10 Essential (primary) hypertension: Secondary | ICD-10-CM | POA: Diagnosis not present

## 2014-07-23 DIAGNOSIS — M5136 Other intervertebral disc degeneration, lumbar region: Secondary | ICD-10-CM | POA: Diagnosis not present

## 2014-07-23 DIAGNOSIS — F419 Anxiety disorder, unspecified: Secondary | ICD-10-CM | POA: Diagnosis not present

## 2014-07-26 DIAGNOSIS — M5136 Other intervertebral disc degeneration, lumbar region: Secondary | ICD-10-CM | POA: Diagnosis not present

## 2014-07-26 DIAGNOSIS — K589 Irritable bowel syndrome without diarrhea: Secondary | ICD-10-CM | POA: Diagnosis not present

## 2014-07-26 DIAGNOSIS — I10 Essential (primary) hypertension: Secondary | ICD-10-CM | POA: Diagnosis not present

## 2014-07-26 DIAGNOSIS — F419 Anxiety disorder, unspecified: Secondary | ICD-10-CM | POA: Diagnosis not present

## 2014-07-27 ENCOUNTER — Encounter: Payer: Self-pay | Admitting: Family Medicine

## 2014-07-27 ENCOUNTER — Ambulatory Visit (INDEPENDENT_AMBULATORY_CARE_PROVIDER_SITE_OTHER): Payer: Commercial Managed Care - HMO | Admitting: Family Medicine

## 2014-07-27 VITALS — BP 150/82 | HR 75 | Ht 68.0 in | Wt 154.0 lb

## 2014-07-27 DIAGNOSIS — M545 Low back pain, unspecified: Secondary | ICD-10-CM

## 2014-07-27 DIAGNOSIS — F411 Generalized anxiety disorder: Secondary | ICD-10-CM | POA: Diagnosis not present

## 2014-07-27 DIAGNOSIS — M5430 Sciatica, unspecified side: Secondary | ICD-10-CM | POA: Diagnosis not present

## 2014-07-27 DIAGNOSIS — I10 Essential (primary) hypertension: Secondary | ICD-10-CM | POA: Diagnosis not present

## 2014-07-27 MED ORDER — FLUOXETINE HCL 10 MG PO CAPS: ORAL_CAPSULE | ORAL | Status: DC

## 2014-07-27 NOTE — Progress Notes (Signed)
   Subjective:    Patient ID: Tonya [name], female    DOB: [birth year], 81 y.o.   MRN: [id number]  HPI Hypertension- Pt denies chest pain, SOB, dizziness, or heart palpitations.  Taking meds as directed w/o problems.  Denies medication side effects.    Was seen for acute back pain radiating into her buttock on 07/05/14. She has started PT.  She has been in pain every day. PT has been coming out 3 times per week.  Has been coming out for 3 weeks.  The hydrocodone has made her nauseated.  Tylenol helps some.  She is now able to walk but using a cane. She has made some progress.  Generalized anxiety disorder-again we discussed possibly starting an SSRI. I really think this could be helpful for her overall addition to helping control some of her IBS symptoms. She's been very resistant to trying this for the last several years. Her daughter is here with her today.  Review of Systems     Objective:   Physical Exam  Constitutional: She is oriented to person, place, and time. She appears well-developed and well-nourished.  HENT:  Head: Normocephalic and atraumatic.  Cardiovascular: Normal rate, regular rhythm and normal heart sounds.   Pulmonary/Chest: Effort normal and breath sounds normal.  Musculoskeletal:  Normal flexion, decreased extension, normal rotation right and left. She did experience significant pain with full flexion and with extension. Nontender over the lumbar spine. She is mildly tender over the spinous muscles on the right and the left. Also some pain into the mid back area over the piriformis. Nontender over the SI joints. Hip, knee, ankle strength is 5 out of 5 bilaterally. Patellar reflexes are symmetric.  Neurological: She is alert and oriented to person, place, and time.  Skin: Skin is warm and dry.  Psychiatric: She has a normal mood and affect. Her behavior is normal.          Assessment & Plan:  HTN-  Move BP med  to the AM.  She says home BPs are well controlled.     Acute Back Pain with sciatica- will continue PT. she's making some great strides. She is now able to walk and wasn't initially. I would like to continue physical therapy for at least 2-3 more weeks. I do feel like it's made a big difference. Continue home stretches and exercises given by the physical therapist. Not really using hydrocodone. Will refill tramadol when needed.  GAD-  Will start fluoextine. Her son takes this and does well with it. We'll start with a very low dose. Discussed potential side effects. Follow up in one month.

## 2014-07-28 DIAGNOSIS — I10 Essential (primary) hypertension: Secondary | ICD-10-CM | POA: Diagnosis not present

## 2014-07-28 DIAGNOSIS — F419 Anxiety disorder, unspecified: Secondary | ICD-10-CM | POA: Diagnosis not present

## 2014-07-28 DIAGNOSIS — M5136 Other intervertebral disc degeneration, lumbar region: Secondary | ICD-10-CM | POA: Diagnosis not present

## 2014-07-28 DIAGNOSIS — K589 Irritable bowel syndrome without diarrhea: Secondary | ICD-10-CM | POA: Diagnosis not present

## 2014-08-04 DIAGNOSIS — I10 Essential (primary) hypertension: Secondary | ICD-10-CM | POA: Diagnosis not present

## 2014-08-04 DIAGNOSIS — K589 Irritable bowel syndrome without diarrhea: Secondary | ICD-10-CM | POA: Diagnosis not present

## 2014-08-04 DIAGNOSIS — M5136 Other intervertebral disc degeneration, lumbar region: Secondary | ICD-10-CM | POA: Diagnosis not present

## 2014-08-04 DIAGNOSIS — F419 Anxiety disorder, unspecified: Secondary | ICD-10-CM | POA: Diagnosis not present

## 2014-08-06 DIAGNOSIS — F419 Anxiety disorder, unspecified: Secondary | ICD-10-CM | POA: Diagnosis not present

## 2014-08-06 DIAGNOSIS — I10 Essential (primary) hypertension: Secondary | ICD-10-CM | POA: Diagnosis not present

## 2014-08-06 DIAGNOSIS — K589 Irritable bowel syndrome without diarrhea: Secondary | ICD-10-CM | POA: Diagnosis not present

## 2014-08-06 DIAGNOSIS — M5136 Other intervertebral disc degeneration, lumbar region: Secondary | ICD-10-CM | POA: Diagnosis not present

## 2014-08-10 DIAGNOSIS — I10 Essential (primary) hypertension: Secondary | ICD-10-CM | POA: Diagnosis not present

## 2014-08-10 DIAGNOSIS — M5136 Other intervertebral disc degeneration, lumbar region: Secondary | ICD-10-CM | POA: Diagnosis not present

## 2014-08-10 DIAGNOSIS — K589 Irritable bowel syndrome without diarrhea: Secondary | ICD-10-CM | POA: Diagnosis not present

## 2014-08-10 DIAGNOSIS — F419 Anxiety disorder, unspecified: Secondary | ICD-10-CM | POA: Diagnosis not present

## 2014-08-13 DIAGNOSIS — F419 Anxiety disorder, unspecified: Secondary | ICD-10-CM | POA: Diagnosis not present

## 2014-08-13 DIAGNOSIS — K589 Irritable bowel syndrome without diarrhea: Secondary | ICD-10-CM | POA: Diagnosis not present

## 2014-08-13 DIAGNOSIS — I10 Essential (primary) hypertension: Secondary | ICD-10-CM | POA: Diagnosis not present

## 2014-08-13 DIAGNOSIS — M5136 Other intervertebral disc degeneration, lumbar region: Secondary | ICD-10-CM | POA: Diagnosis not present

## 2014-08-16 DIAGNOSIS — F419 Anxiety disorder, unspecified: Secondary | ICD-10-CM | POA: Diagnosis not present

## 2014-08-16 DIAGNOSIS — M5136 Other intervertebral disc degeneration, lumbar region: Secondary | ICD-10-CM | POA: Diagnosis not present

## 2014-08-16 DIAGNOSIS — I10 Essential (primary) hypertension: Secondary | ICD-10-CM | POA: Diagnosis not present

## 2014-08-16 DIAGNOSIS — K589 Irritable bowel syndrome without diarrhea: Secondary | ICD-10-CM | POA: Diagnosis not present

## 2014-08-19 DIAGNOSIS — K589 Irritable bowel syndrome without diarrhea: Secondary | ICD-10-CM | POA: Diagnosis not present

## 2014-08-19 DIAGNOSIS — F419 Anxiety disorder, unspecified: Secondary | ICD-10-CM | POA: Diagnosis not present

## 2014-08-19 DIAGNOSIS — M5136 Other intervertebral disc degeneration, lumbar region: Secondary | ICD-10-CM | POA: Diagnosis not present

## 2014-08-19 DIAGNOSIS — I10 Essential (primary) hypertension: Secondary | ICD-10-CM | POA: Diagnosis not present

## 2014-08-23 ENCOUNTER — Other Ambulatory Visit: Payer: Self-pay | Admitting: Family Medicine

## 2014-08-25 ENCOUNTER — Ambulatory Visit: Payer: Commercial Managed Care - HMO | Admitting: Family Medicine

## 2014-08-26 ENCOUNTER — Ambulatory Visit: Payer: Commercial Managed Care - HMO | Admitting: Family Medicine

## 2014-08-30 ENCOUNTER — Other Ambulatory Visit: Payer: Self-pay | Admitting: Family Medicine

## 2014-08-31 NOTE — Telephone Encounter (Signed)
F/u Aug for BP

## 2014-09-03 ENCOUNTER — Ambulatory Visit: Payer: Commercial Managed Care - HMO | Admitting: Family Medicine

## 2014-09-20 ENCOUNTER — Encounter: Payer: Self-pay | Admitting: Family Medicine

## 2014-09-20 ENCOUNTER — Ambulatory Visit (INDEPENDENT_AMBULATORY_CARE_PROVIDER_SITE_OTHER): Payer: Commercial Managed Care - HMO | Admitting: Family Medicine

## 2014-09-20 VITALS — BP 160/87 | HR 76 | Temp 97.8°F | Resp 16 | Ht 67.0 in | Wt 155.0 lb

## 2014-09-20 DIAGNOSIS — F411 Generalized anxiety disorder: Secondary | ICD-10-CM

## 2014-09-20 DIAGNOSIS — M5430 Sciatica, unspecified side: Secondary | ICD-10-CM

## 2014-09-20 MED ORDER — ALPRAZOLAM 0.25 MG PO TABS: ORAL_TABLET | ORAL | Status: DC

## 2014-09-20 NOTE — Progress Notes (Signed)
   Subjective:    Patient ID: Tonya [name], female    DOB: [birth year], 81 y.o.   MRN: [id number]  HPI Says her sciatica is better. Did PT 3 times per week. Says the steroid doesn't help. Says her pain is much better. Not gone, but better.  She is happy with where she is.    GAD- Started the fluoxetine mid day and felt nauseated that one night. She stopped it after that. Never took an additional pill. She feels like she's doing well on her current regimen of the alprazolam twice a day.   Review of Systems     Objective:   Physical Exam  Constitutional: She is oriented to person, place, and time. She appears well-developed and well-nourished.  HENT:  Head: Normocephalic and atraumatic.  Eyes: Conjunctivae and EOM are normal.  Cardiovascular: Normal rate.   Pulmonary/Chest: Effort normal.  Musculoskeletal:  Nontender over the lumbar spine. Negative straight leg raise bilaterally. Hips, knees, ankle strength is 5 out of 5 bilaterally. Patellar reflexes 1+ bilaterally.  Neurological: She is alert and oriented to person, place, and time.  Skin: Skin is dry. No pallor.  Psychiatric: She has a normal mood and affect. Her behavior is normal.          Assessment & Plan:  GAD- Encouaraged her to reconsider staring it again. We discussed once again using Xanax as a maintenance medication is not appropriate and really should just be a rescue medicine. She says she will think about restarting the fluoxetine. I encouraged her to give it a try again. The nausea she extends that night may or may not have been related. If it does cause nausea and is mild encouraged her to stick with the medication for 2 weeks just to see if it goes away. If it's more severe nausea then we can consider a different medication in its place. GAD- 7 score of 5.   Sciatic -fairly normal exam today. much improved after physical therapy that she struggled for about 2 months to recover.

## 2014-09-27 ENCOUNTER — Telehealth: Payer: Self-pay | Admitting: *Deleted

## 2014-09-27 DIAGNOSIS — M2040 Other hammer toe(s) (acquired), unspecified foot: Secondary | ICD-10-CM

## 2014-09-27 NOTE — Telephone Encounter (Signed)
Podiatry referral placed for ins purposes.

## 2014-09-29 DIAGNOSIS — M2040 Other hammer toe(s) (acquired), unspecified foot: Secondary | ICD-10-CM | POA: Diagnosis not present

## 2014-09-29 DIAGNOSIS — L84 Corns and callosities: Secondary | ICD-10-CM | POA: Diagnosis not present

## 2014-11-18 ENCOUNTER — Other Ambulatory Visit: Payer: Self-pay | Admitting: Family Medicine

## 2014-11-22 ENCOUNTER — Other Ambulatory Visit: Payer: Self-pay | Admitting: *Deleted

## 2014-11-22 ENCOUNTER — Telehealth: Payer: Self-pay | Admitting: *Deleted

## 2014-11-22 MED ORDER — ALPRAZOLAM 0.25 MG PO TABS: ORAL_TABLET | ORAL | Status: DC

## 2014-11-22 NOTE — Telephone Encounter (Signed)
Ok to reprtin

## 2014-11-22 NOTE — Telephone Encounter (Signed)
Pharmacy called stating that there was only one refill attached to pt's xanax rx in April and that is what is in her chart. There were 2 refills attached to the prozac but not the xanax.  Is it ok for me to print? Please advise.

## 2015-01-07 ENCOUNTER — Encounter: Payer: Self-pay | Admitting: Family Medicine

## 2015-01-07 ENCOUNTER — Ambulatory Visit (INDEPENDENT_AMBULATORY_CARE_PROVIDER_SITE_OTHER): Payer: Commercial Managed Care - HMO | Admitting: Family Medicine

## 2015-01-07 VITALS — BP 140/88 | HR 81 | Ht 67.0 in | Wt 154.0 lb

## 2015-01-07 DIAGNOSIS — K589 Irritable bowel syndrome without diarrhea: Secondary | ICD-10-CM

## 2015-01-07 DIAGNOSIS — F411 Generalized anxiety disorder: Secondary | ICD-10-CM

## 2015-01-07 DIAGNOSIS — F410 Panic disorder [episodic paroxysmal anxiety] without agoraphobia: Secondary | ICD-10-CM | POA: Diagnosis not present

## 2015-01-07 MED ORDER — ESCITALOPRAM OXALATE 10 MG PO TABS: 10.0000 mg | ORAL_TABLET | Freq: Every day | ORAL | Status: DC

## 2015-01-07 MED ORDER — ONDANSETRON 4 MG PO TBDP: 4.0000 mg | ORAL_TABLET | Freq: Three times a day (TID) | ORAL | Status: DC | PRN

## 2015-01-07 MED ORDER — ALPRAZOLAM 0.25 MG PO TABS: ORAL_TABLET | ORAL | Status: DC

## 2015-01-07 NOTE — Progress Notes (Signed)
Subjective:    Patient ID: Tonya [name], female    DOB: [birth year], 81 y.o.   MRN: [id number]  HPI She went [place] the [place] around [place] 4 and she started having more panic attacks and more frontal headaches. She's been taking Aleve. She does complain of some aching behind her eyes, but no visual changes. She's she's been more nauseated than usual due to the panic attacks and she's been using her Xanax up to 3 times a day. She has been having a lot of diarrhea as well.  Had 8 BMs  Yesterday. HA have been more across her headaches. Says feels like her throat is closing up and face gets red when she has her panic attacks.     Review of Systems  BP 140/88 mmHg  Pulse 81  Ht  (1.702 m)  Wt 154 lb (69.854 kg)  BMI 24.11 kg/m2  SpO2 96%    Allergies  Allergen Reactions  . Amlodipine Other (See Comments)    Ankle swelling.   . Codeine     Other reaction(s): Unknown  . Tramadol Nausea Only    Past Medical History  Diagnosis Date  . IBS (irritable bowel syndrome)   . Hypertension   . Anxiety   . Degenerative disc disease   . Rectocele   . Cystocele     Past Surgical History  Procedure Laterality Date  . Cholecystectomy    . Abdominal hysterectomy      partial    History   Social History  . Marital Status: Widowed    Spouse Name: N/A  . Number of Children: N/A  . Years of Education: N/A   Occupational History  . Not on file.   Social History Main Topics  . Smoking status: Never Smoker   . Smokeless tobacco: Not on file  . Alcohol Use: No  . Drug Use: No  . 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   Other Topics Concern  . Not on file   Social History Narrative    Family History  Problem Relation Age of Onset  . Depression    . GER disease Son   . Depression Son     Outpatient Encounter Prescriptions as of 01/07/2015   Medication Sig  . ALPRAZolam (XANAX) 0.25 MG tablet TAKE 1 TABLET BY MOUTH TWICE DAILY AS NEEDED FOR ANXIETY OR SLEEP  . losartan (COZAAR) 100 MG tablet TAKE 1 TABLET BY MOUTH DAILY  . [DISCONTINUED] ALPRAZolam (XANAX) 0.25 MG tablet TAKE 1 TABLET BY MOUTH TWICE DAILY AS NEEDED FOR ANXIETY OR SLEEP  . [DISCONTINUED] FLUoxetine (PROZAC) 10 MG capsule One a day x 2 weeks then increase to 2 tabs a day.  . escitalopram (LEXAPRO) 10 MG tablet Take 1 tablet (10 mg total) by mouth daily.  . ondansetron (ZOFRAN ODT) 4 MG disintegrating tablet Take 1 tablet (4 mg total) by mouth every 8 (eight) hours as needed for nausea or vomiting.   No facility-administered encounter medications on file as of 01/07/2015.          Objective:   Physical Exam  Constitutional: She is oriented to person, place, and time. She appears well-developed and well-nourished.  HENT:  Head: Normocephalic and atraumatic.  Cardiovascular: Normal rate, regular rhythm and normal heart sounds.   Pulmonary/Chest: Effort normal and breath sounds normal.  Neurological: She is alert and  oriented to person, place, and time.  Skin: Skin is warm and dry.  Psychiatric: She has a normal mood and affect. Her behavior is normal.          Assessment & Plan:  Anxiety with panic attacks. She never started the fluoxetine but says she might be willing to try the one that starts with an L. I'm assuming she's talking about Lexapro. We had a very long discussion today about taking something daily as a controller for her anxiety and then being able to use Xanax as needed so that she can start using it more sparingly. We have had this same conversation multiple times over the last several years. Also offered to refer her to psychiatry. She was seeing a therapist for a short period of time. She says that she will agree to at least try the Lexapro. Follow-up in 3 weeks.  Irritable bowel syndrome-has been flaring which is very typical for her when  her anxiety levels increase. Discussed how an SSRI can also be helpful for irritable bowel syndrome. Again we have had this conversation multiple times as well. I did send over a short supply of Zofran for her to use for the nausea.  Time spent 30 min,  > 50% of the time counseling about anxiety, panic attacks.

## 2015-01-10 ENCOUNTER — Other Ambulatory Visit: Payer: Self-pay | Admitting: *Deleted

## 2015-01-10 MED ORDER — ALPRAZOLAM 0.25 MG PO TABS: 0.2500 mg | ORAL_TABLET | Freq: Two times a day (BID) | ORAL | Status: DC | PRN

## 2015-01-26 ENCOUNTER — Encounter: Payer: Self-pay | Admitting: Family Medicine

## 2015-01-26 ENCOUNTER — Ambulatory Visit (INDEPENDENT_AMBULATORY_CARE_PROVIDER_SITE_OTHER): Payer: Commercial Managed Care - HMO | Admitting: Family Medicine

## 2015-01-26 VITALS — BP 138/80 | HR 61 | Temp 98.3°F | Wt 157.0 lb

## 2015-01-26 DIAGNOSIS — I10 Essential (primary) hypertension: Secondary | ICD-10-CM | POA: Diagnosis not present

## 2015-01-26 DIAGNOSIS — F411 Generalized anxiety disorder: Secondary | ICD-10-CM | POA: Diagnosis not present

## 2015-01-26 NOTE — Progress Notes (Signed)
   Subjective:    Patient ID: Tonya [name], female    DOB: 1927-03-13, 81 y.o.   MRN: [id number]  HPI She has been on Lexapro for the last 3 weeks.  She says the first week she had an IBS flare. Says she is feeling better this last week. She denies any headaches. She feels like she's a little bit more sleepy than usual since starting the medication. She has been taking it first thing in the morning. Her daughter is here with her today and is very supportive.  HTN- says the last losartan was really large.  The manufacturer has changed.  She is having a hard time swallowing it.      Review of Systems     Objective:   Physical Exam  Constitutional: She is oriented to person, place, and time. She appears well-developed and well-nourished.  HENT:  Head: Normocephalic and atraumatic.  Cardiovascular: Normal rate, regular rhythm and normal heart sounds.   Pulmonary/Chest: Effort normal and breath sounds normal.  Neurological: She is alert and oriented to person, place, and time.  Skin: Skin is warm and dry.  Psychiatric: She has a normal mood and affect. Her behavior is normal.          Assessment & Plan:  GAD - gad 7 score of 6 today and PHQ 9 score of 4. We'll continue with Lexapro. Did encourage her to move it to bedtime she does feel little bit more sleepy on it. That way she can take advantage of that and maybe it would help her with sleep. Encouraged her to decrease her Xanax in the evening half of a tab. We will slowly wean this down over the next several months. Follow-up in 3-4 weeks. Her daughter was here with her today and is very encouraging.   HTN - well controlled. Repeat blood pressure looks great today. This will continue to monitor. Encouraged her to call the pharmacy and find out which generic she was getting previously and asked them if they would be able to fill the prior generic version of losartan.

## 2015-01-26 NOTE — Patient Instructions (Signed)
Try to decrease the evening dose of the xanax to half a tab and keep the whole tab in the morning.   Move your Lexapro to evening.

## 2015-02-23 ENCOUNTER — Ambulatory Visit (INDEPENDENT_AMBULATORY_CARE_PROVIDER_SITE_OTHER): Payer: Commercial Managed Care - HMO | Admitting: Family Medicine

## 2015-02-23 ENCOUNTER — Encounter: Payer: Self-pay | Admitting: Family Medicine

## 2015-02-23 VITALS — BP 149/76 | HR 68 | Wt 158.0 lb

## 2015-02-23 DIAGNOSIS — F411 Generalized anxiety disorder: Secondary | ICD-10-CM | POA: Diagnosis not present

## 2015-02-23 DIAGNOSIS — K589 Irritable bowel syndrome without diarrhea: Secondary | ICD-10-CM | POA: Diagnosis not present

## 2015-02-23 DIAGNOSIS — IMO0001 Reserved for inherently not codable concepts without codable children: Secondary | ICD-10-CM

## 2015-02-23 DIAGNOSIS — R05 Cough: Secondary | ICD-10-CM

## 2015-02-23 DIAGNOSIS — R143 Flatulence: Secondary | ICD-10-CM

## 2015-02-23 DIAGNOSIS — R059 Cough, unspecified: Secondary | ICD-10-CM

## 2015-02-23 NOTE — Progress Notes (Signed)
   Subjective:    Patient ID: Tonya [name], female    DOB: 20-Apr-1927, 81 y.o.   MRN: [id number]  HPI GAD- Says she is still taking the Lexapro and is doing well. Had some nausea the first 2 weeks but that is done. She really feels like it's made a big difference in her life and really reduced her overall anxiety levels. She feels like it's more manageable now. She says she feels like it's even help settle her stomach and that her IBS has been better. She has been trying to decrease her xanax to a half a tab.     Hypertension-when I last saw her the generic version of her losartan had been changed and they were so large that she was actually having difficulty swallowing them. She was able to get a losartan that she can swallow.   Has had more gas lately.  She wants to know the probiotic would be safe to take.  She's also had a little tickling cough in her chest that she feels is allergy related but did want me to listen to her chest. The cough is nonproductive. No sinus symptoms. She does typically have ragweed seasonal allergies and feels like it has started. No fevers. No worsening or alleviating factors.  Review of Systems     Objective:   Physical Exam  Constitutional: She is oriented to person, place, and time. She appears well-developed and well-nourished.  HENT:  Head: Normocephalic and atraumatic.  Cardiovascular: Normal rate, regular rhythm and normal heart sounds.   Pulmonary/Chest: Effort normal and breath sounds normal.  Neurological: She is alert and oriented to person, place, and time.  Skin: Skin is warm and dry.  Psychiatric: She has a normal mood and affect. Her behavior is normal.          Assessment & Plan:  GAD - overall she's doing really well. Her gad 7 score was 5 and previous was 6 area her PHQ 9 score is 4. She denies feeling down or hopeless. She does still complain of feeling anxious several days a week and feeling like she worries too much and has  difficulty relaxing several days of the week. She rates her symptoms is somewhat difficult but overall feels like the Lexapro has made a huge difference in her life. Follow-up in 3 months.   Bloating and gas-did encourage her to try a probiotic for month and see if this helps. She can even do natural culture yogurt as well as made some recommendations on that as well.  Cough-agree most likely allergy related. Chest exam is clear today. Call if any sinus symptoms develop, cough worsens, becomes productive, or she develops fevers chills or sweats.  IBS - improved on the SSRI. Reminded her that she may still have some flares from time to time but overall study show the SSRIs do tend to reduce the symptoms of IBS.  Hypertension-she was able to get a generic losartan that she is able to swallow.

## 2015-03-14 ENCOUNTER — Other Ambulatory Visit: Payer: Self-pay | Admitting: Family Medicine

## 2015-03-31 ENCOUNTER — Telehealth: Payer: Self-pay | Admitting: *Deleted

## 2015-03-31 NOTE — Telephone Encounter (Signed)
Pt called and wanted to know what she can take for her cough. She states in the past Dr. Linford ArnoldMetheney said she could take Delsym. She denies fever and she doesn't feel bad. Pt states it is not a productive cough.I advised her that as long as its the plain delsym and doesn't have the decongestant in it then she can take it. Advised to call back if any new sxs developed such as fever,sob, body aches,ect to call the office. Pt agreed and voiced understanding.

## 2015-04-04 ENCOUNTER — Other Ambulatory Visit: Payer: Self-pay | Admitting: Family Medicine

## 2015-04-17 ENCOUNTER — Other Ambulatory Visit: Payer: Self-pay | Admitting: Family Medicine

## 2015-05-04 ENCOUNTER — Other Ambulatory Visit: Payer: Self-pay | Admitting: Family Medicine

## 2015-05-04 DIAGNOSIS — L600 Ingrowing nail: Secondary | ICD-10-CM | POA: Diagnosis not present

## 2015-05-04 DIAGNOSIS — M2041 Other hammer toe(s) (acquired), right foot: Secondary | ICD-10-CM | POA: Diagnosis not present

## 2015-05-08 ENCOUNTER — Other Ambulatory Visit: Payer: Self-pay | Admitting: Family Medicine

## 2015-05-09 ENCOUNTER — Other Ambulatory Visit: Payer: Self-pay | Admitting: Family Medicine

## 2015-05-25 ENCOUNTER — Ambulatory Visit (INDEPENDENT_AMBULATORY_CARE_PROVIDER_SITE_OTHER): Payer: Commercial Managed Care - HMO | Admitting: Family Medicine

## 2015-05-25 ENCOUNTER — Encounter: Payer: Self-pay | Admitting: Family Medicine

## 2015-05-25 VITALS — BP 128/68 | HR 65 | Temp 97.8°F | Resp 18 | Wt 163.5 lb

## 2015-05-25 DIAGNOSIS — H6123 Impacted cerumen, bilateral: Secondary | ICD-10-CM

## 2015-05-25 DIAGNOSIS — K589 Irritable bowel syndrome without diarrhea: Secondary | ICD-10-CM

## 2015-05-25 DIAGNOSIS — R29818 Other symptoms and signs involving the nervous system: Secondary | ICD-10-CM

## 2015-05-25 DIAGNOSIS — R2689 Other abnormalities of gait and mobility: Secondary | ICD-10-CM

## 2015-05-25 DIAGNOSIS — F411 Generalized anxiety disorder: Secondary | ICD-10-CM

## 2015-05-25 MED ORDER — ALPRAZOLAM 0.25 MG PO TABS: ORAL_TABLET | ORAL | Status: DC

## 2015-05-25 NOTE — Progress Notes (Addendum)
Subjective:    Patient ID: Tonya [name], female    DOB: 1926-07-23, 81 y.o.   MRN: [id number]  HPI IBS - says she has been doing great for the last 3 months until she had pizza and then had diarrhea 11 times for about 2 days.   Anxiety - she is now taking 1 tab of xanax BID.  Tried cutting it down to half and says it was hard to do that.    Says feels like her balance is off for about a week.  No ear pain or pressure. Had a cough for almost 2 months and she is now better.  Used almost 2 bottles of delsym.  No fevers or chills. No drainage from the ear. She hasn't been as active recently.   Review of Systems     Objective:   Physical Exam  Constitutional: She is oriented to person, place, and time. She appears well-developed and well-nourished.  HENT:  Head: Normocephalic and atraumatic.  Right Ear: External ear normal.  Left Ear: External ear normal.  Nose: Nose normal.  Mouth/Throat: Oropharynx is clear and moist.   Bilateral cerumen impaction.  Eyes: Conjunctivae and EOM are normal. Pupils are equal, round, and reactive to light.  Neck: Neck supple. No thyromegaly present.  Cardiovascular: Normal rate, regular rhythm and normal heart sounds.   Pulmonary/Chest: Effort normal and breath sounds normal. She has no wheezes.  Lymphadenopathy:    She has no cervical adenopathy.  Neurological: She is alert and oriented to person, place, and time.  Skin: Skin is warm and dry.  Psychiatric: She has a normal mood and affect.          Assessment & Plan:  IBS - I think overall she's done fantastic. In fact the last 3 months she's been really well controlled up until about a week ago when she ate pizza. Discussed that this may be a trigger for her she may need to really avoid that type of food. I think the Lexapro has really helped manage her IBS as well.   GAD- doing well on Lexapro. Gad 7 score of 3 today. Rates her symptoms as not difficult at all. She is at her therapeutic  goal. Continue current regimen. We discussed continue to work on weaning the Xanax. Since she didn't do well and a half a tab she can surly to half plus a quarter if that helps her slowly wean the medication. We explained that because she has been taking it for so long that her body has been quite dependent on it.  Balance issues - discussed that she may want to look into tai chi for balance in older adults to prevent her symptoms from getting worse. Study showed that this reduces the risk of falls and helps with balance especially as we age. She did have cerumen impaction bilaterally as well. We curetted the ears as well as did an irrigation. We were successfully able to remove most of the wax from the right canal and visualized part of the tympanic membrane. We were still unable to visualize the left tympanic membrane. Recommend that she use the Debrox drops at home and come back to see the nurse for repeat irrigation.  Cerumen Removal Template:   Indication: Cerumen impaction of the ears Medical necessity statement: On physical examination, cerumen impairs clinically significant portions of the external auditory canal, and tympanic membrane. Noted obstructive, copious cerumen that cannot be removed without magnification and instrumentations requiring physician skills  Consent:  Discussed benefits and risks of procedure and verbal consent obtained  Procedure: Patient was prepped for the procedure. Utilized an otoscope to assess and take note of the ear canal, the tympanic membrane, and the presence, amount, and placement of the cerumen. Gentle water irrigation and soft plastic curette was utilized to remove cerumen.  Post procedure examination: shows cerumen was partially removed. Patient tolerated procedure well. The patient is made aware that they may experience temporary vertigo, temporary hearing loss, and temporary discomfort. If these symptom last for more than 24 hours to call the clinic or proceed  to the ED.

## 2015-06-06 ENCOUNTER — Ambulatory Visit: Payer: Commercial Managed Care - HMO

## 2015-06-17 ENCOUNTER — Ambulatory Visit (INDEPENDENT_AMBULATORY_CARE_PROVIDER_SITE_OTHER): Payer: Commercial Managed Care - HMO | Admitting: Osteopathic Medicine

## 2015-06-17 ENCOUNTER — Ambulatory Visit (INDEPENDENT_AMBULATORY_CARE_PROVIDER_SITE_OTHER): Payer: Commercial Managed Care - HMO

## 2015-06-17 ENCOUNTER — Encounter: Payer: Self-pay | Admitting: Osteopathic Medicine

## 2015-06-17 VITALS — BP 149/76 | HR 89 | Temp 99.0°F | Wt 165.0 lb

## 2015-06-17 DIAGNOSIS — J984 Other disorders of lung: Secondary | ICD-10-CM

## 2015-06-17 DIAGNOSIS — K449 Diaphragmatic hernia without obstruction or gangrene: Secondary | ICD-10-CM

## 2015-06-17 DIAGNOSIS — R059 Cough, unspecified: Secondary | ICD-10-CM

## 2015-06-17 DIAGNOSIS — R05 Cough: Secondary | ICD-10-CM

## 2015-06-17 DIAGNOSIS — J209 Acute bronchitis, unspecified: Secondary | ICD-10-CM | POA: Diagnosis not present

## 2015-06-17 DIAGNOSIS — I517 Cardiomegaly: Secondary | ICD-10-CM

## 2015-06-17 MED ORDER — BENZONATATE 200 MG PO CAPS: 200.0000 mg | ORAL_CAPSULE | Freq: Three times a day (TID) | ORAL | Status: DC | PRN

## 2015-06-17 MED ORDER — METHYLPREDNISOLONE 4 MG PO TBPK: ORAL_TABLET | ORAL | Status: DC

## 2015-06-17 MED ORDER — AZITHROMYCIN 250 MG PO TABS: ORAL_TABLET | ORAL | Status: DC

## 2015-06-17 MED ORDER — FLUTICASONE-SALMETEROL 115-21 MCG/ACT IN AERO: 2.0000 | INHALATION_SPRAY | Freq: Two times a day (BID) | RESPIRATORY_TRACT | Status: DC

## 2015-06-17 NOTE — Patient Instructions (Signed)
Cough, Adult Coughing is a reflex that clears your throat and your airways. Coughing helps to heal and protect your lungs. It is normal to cough occasionally, but a cough that happens with other symptoms or lasts a long time may be a sign of a condition that needs treatment. A cough may last only 2-3 weeks (acute), or it may last longer than 8 weeks (chronic). CAUSES Coughing is commonly caused by:  Breathing in substances that irritate your lungs.  A viral or bacterial respiratory infection.  Allergies.  Asthma.  Postnasal drip.  Smoking.  Acid backing up from the stomach into the esophagus (gastroesophageal reflux).  Certain medicines.  Chronic lung problems, including COPD (or rarely, lung cancer).  Other medical conditions such as heart failure. HOME CARE INSTRUCTIONS  Pay attention to any changes in your symptoms. Take these actions to help with your discomfort:  Take medicines only as told by your health care [employer].  If you were prescribed an antibiotic medicine, take it as told by your health care Diannia Hogenson. Do not stop taking the antibiotic even if you start to feel better.  Talk with your health care Hilberto Burzynski before you take a cough suppressant medicine.  Drink enough fluid to keep your urine clear or pale yellow.  If the air is dry, use a cold steam vaporizer or humidifier in your bedroom or your home to help loosen secretions.  Avoid anything that causes you to cough at work or at home.  If your cough is worse at night, try sleeping in a semi-upright position.  Avoid cigarette smoke. If you smoke, quit smoking. If you need help quitting, ask your health care Jessicah Croll.  Avoid caffeine.  Avoid alcohol.  Rest as needed. SEEK MEDICAL CARE IF:   You have new symptoms.  You cough up pus.  Your cough does not get better after 2-3 weeks, or your cough gets worse.  You cannot control your cough with suppressant medicines and you are losing sleep.  You  develop pain that is getting worse or pain that is not controlled with pain medicines.  You have a fever.  You have unexplained weight loss.  You have night sweats. SEEK IMMEDIATE MEDICAL CARE IF:  You cough up blood.  You have difficulty breathing.  Your heartbeat is very fast.   This information is not intended to replace advice given to you by your health care Librada Castronovo. Make sure you discuss any questions you have with your health care Netty Sullivant.   Document Released: 12/01/2010 Document Revised: 02/23/2015 Document Reviewed: 08/11/2014 Elsevier Interactive Patient Education 2016 Elsevier Inc.  

## 2015-06-17 NOTE — Progress Notes (Signed)
HPI: Tonya [name] is a 81 y.o. female who presents to [facility]  today for chief complaint of:  Chief Complaint  Patient presents with  . Cough    . Location: "chest cold"  . Quality: coughing . Severity: moderate to severe . Duration: >7 days . Context: yes sick contacts, no recent travel . Modifying factors: has tried the following OTC medications: none without relief . Assoc signs/symptoms: no fever/chills, no productive cough, Yes  body aches, No  GI upset   Past medical, social and family history reviewed: Past Medical History  Diagnosis Date  . IBS (irritable bowel syndrome)   . Hypertension   . Anxiety   . Degenerative disc disease   . Rectocele   . Cystocele    Past Surgical History  Procedure Laterality Date  . Cholecystectomy    . Abdominal hysterectomy      partial   Social History  Substance Use Topics  . Smoking status: Never Smoker   . Smokeless tobacco: Not on file  . Alcohol Use: No   Family History  Problem Relation Age of Onset  . Depression    . GER disease Son   . Depression Son     Current Outpatient Prescriptions  Medication Sig Dispense Refill  . ALPRAZolam (XANAX) 0.25 MG tablet TAKE 1 TABLET BY MOUTH TWICE DAILY AS NEEDED FOR ANXIETY OR SLEEP 60 tablet 1  . escitalopram (LEXAPRO) 10 MG tablet TAKE 1 TABLET(10 MG) BY MOUTH DAILY 30 tablet 5  . losartan (COZAAR) 100 MG tablet TAKE 1 TABLET BY MOUTH DAILY 30 tablet 5  . ondansetron (ZOFRAN ODT) 4 MG disintegrating tablet Take 1 tablet (4 mg total) by mouth every 8 (eight) hours as needed for nausea or vomiting. 10 tablet 0  . [DISCONTINUED] amLODipine (NORVASC) 2.5 MG tablet Take 2 tablets (5 mg total) by mouth daily. 30 tablet 1   No current facility-administered medications for this visit.   Allergies  Allergen Reactions  . Amlodipine Other (See Comments)    Ankle swelling.   . Codeine     Other reaction(s): Unknown  . Tramadol Nausea Only       Review of Systems: CONSTITUTIONAL: no fever/chills HEAD/EYES/EARS/NOSE/THROAT: no headache, no vision change or hearing change, yes sore throat CARDIAC: No chest pain/pressure/palpitations, no orthopnea RESPIRATORY: yes cough, no shortness of breath GASTROINTESTINAL: no nausea, no vomiting, no abdominal pain/blood in stool/diarrhea/constipation MUSCULOSKELETAL: yes myalgia/arthralgia   Exam:  BP 149/76 mmHg  Pulse 89  Temp(Src) 99 F (37.2 C) (Oral)  Wt 165 lb (74.844 kg) Constitutional: VSS, see above. General Appearance: alert, well-developed, well-nourished, NAD Eyes: Normal lids and conjunctive, non-icteric sclera, PERRLA Ears, Nose, Mouth, Throat: Normal external inspection ears/nares/mouth/lips/gums, obscured by cerumen TM, MMM; posterior pharynx without erythema, without exudate Neck: No masses, trachea midline. No thyroid enlargement/tenderness/mass appreciated, normal lymph nodes Respiratory: Normal respiratory effort. No  wheeze/rhonchi/rales Cardiovascular: S1/S2 normal, no murmur/rub/gallop auscultated. RRR. No carotid bruit or JVD. No lower extremity edema.  CXR: on personal review - large hiatal hernia in the thorax. No visible pneumonia/bronchitis infiltrate however stomach of course obscures view. Await radiology overread...  "EXAM: CHEST 2 VIEW COMPARISON: 05/30/2008 FINDINGS: Enlargement of cardiac silhouette. Mild elongation and atherosclerotic calcification of the thoracic aorta. Large hiatal hernia. On the lateral view, multiple collections of gas are identified at the hiatal hernia, cannot completely exclude paraesophageal herniation of bowel. Pulmonary vascularity normal. Lungs hyperinflated but otherwise clear. No pleural effusion or pneumothorax. Diffuse osseous  demineralization. IMPRESSION: Hyperinflated lungs without acute infiltrate. Enlargement of cardiac silhouette. Large hiatal hernia, cannot completely exclude  paraesophageal herniation of bowel. Electronically Signed By: Angelyn Punt.D"   ASSESSMENT/PLAN: She has significant hiatal hernia, radiology read of x-ray cannot exclude paraesophageal herniation of bowel, I also personally reviewed the chest x-ray from 2009, hiatal hernia was present then however appears much larger now. I had some discussion with the patient about possible referral to a surgeon who may be able to discuss correction with her however the patient says she would not be interested in surgery. I advised that it is possible that hernia can cause problems in terms of compressing the surrounding thoracic structures as well as possible bowel obstruction if becomes strangulated, I said I would rather have her discuss all risk/benefit options with a surgeon before completely ruling out repair so I would go ahead and place a referral for her.  Cough - Plan: DG Chest 2 View, methylPREDNISolone (MEDROL DOSEPAK) 4 MG TBPK tablet, fluticasone-salmeterol (ADVAIR HFA) 115-21 MCG/ACT inhaler, benzonatate (TESSALON) 200 MG capsule  Acute bronchitis, unspecified organism - Plan: azithromycin (ZITHROMAX) 250 MG tablet  Hernia, hiatal - Plan: Ambulatory referral to General Surgery   Return if symptoms worsen or fail to improve.

## 2015-06-22 ENCOUNTER — Telehealth: Payer: Self-pay | Admitting: Osteopathic Medicine

## 2015-06-22 NOTE — Telephone Encounter (Signed)
Initially place referral to general surgery for repair of significant hiatal hernia, they came back with request that she be evaluated first by gastroenterology with upper GI with biopsy, endoscopy, 24-hour probe, and manometry. I called the patient to discuss this, since she wasn't very here to go through any major procedures for this issue anyway, she is hesitant to pursue any further workup at this time with other specialists and other testing. Given her age and overall lack of symptoms from this hiatal hernia, would tend to agree. Versus benefits of further workup of this hernia versus leaving it alone were discussed with the patient, she is fine to defer any further workup for now, she will think about it and talk a little bit more about this with Dr. Glade LloydMatheny. Of note, patient reports that her cough is improving.

## 2015-07-20 ENCOUNTER — Encounter: Payer: Self-pay | Admitting: Family Medicine

## 2015-07-20 ENCOUNTER — Ambulatory Visit (INDEPENDENT_AMBULATORY_CARE_PROVIDER_SITE_OTHER): Payer: Commercial Managed Care - HMO | Admitting: Family Medicine

## 2015-07-20 VITALS — BP 132/65 | HR 80 | Wt 166.0 lb

## 2015-07-20 DIAGNOSIS — J209 Acute bronchitis, unspecified: Secondary | ICD-10-CM

## 2015-07-20 DIAGNOSIS — M19072 Primary osteoarthritis, left ankle and foot: Secondary | ICD-10-CM | POA: Diagnosis not present

## 2015-07-20 DIAGNOSIS — M25472 Effusion, left ankle: Secondary | ICD-10-CM

## 2015-07-20 DIAGNOSIS — K589 Irritable bowel syndrome without diarrhea: Secondary | ICD-10-CM

## 2015-07-20 DIAGNOSIS — F411 Generalized anxiety disorder: Secondary | ICD-10-CM | POA: Diagnosis not present

## 2015-07-20 NOTE — Progress Notes (Signed)
   Subjective:    Patient ID: Tonya [name], female    DOB: February 22, 1927, 82 y.o.   MRN: [id number]  HPI Left ankle swelling - has been on and off for weeks.  No recent trauma or injury. She does twisting the ankle. No recent symptoms such as chest pain shortness of breath. It mostly is just swelling on the outside of the ankle. Nothing on the inside. It really isn't very painful but she does do a lot of walking and that started a little bit sore. No increased warmth or redness. No prior history of gout or rheumatoid arthritis. She does occasionally use Advil and it does help.  F/U acute bronchitis. She completed prednisone and zpack and is feeling some better. She went through a bottle of Delsym.    Irritable bowel syndrome-she's actually been doing fantastic on the Lexapro. She said she's only had about 2 episodes and they were very mild. She's been very happy with the results. She still is dealing with a lot of anxiety and having trouble controlling that.  Review of Systems     Objective:   Physical Exam  Constitutional: She is oriented to person, place, and time. She appears well-developed and well-nourished.  HENT:  Head: Normocephalic and atraumatic.  Cardiovascular: Normal rate, regular rhythm and normal heart sounds.   Pulmonary/Chest: Effort normal and breath sounds normal.  Neurological: She is alert and oriented to person, place, and time.  Skin: Skin is warm and dry.  Psychiatric: She has a normal mood and affect. Her behavior is normal.    on the eft outer ankle se does have some swelling around the lateral malleolus. It s just slighty tender anteriorly.Nntede posterioly. Normal range of motion. Strengh is 5 outof5 in all irections.  nontender over the tendons.  No swelling medially.       Assessment & Plan:  Acute bronchits - almost resolved.  She still has an occasional cough and is still using the [employer] as needed but overall she feels much better. Lungs are  clear on exam today.nontender   Left anke swelling - on exam is consistent with osteoarthritis. Certainly if it persists or gets worse or becomes red or hot then recommend further workup and evaluation. His most consistent with osteoarthritis. Recommend an antibiotic for as needed. Can use Tylenol as mainstay therapy if she needs something daily and just use Advil when necessary.  Irritable bowel syndrome-she is doing very well Lexapro. Next  Generalized anxiety disorder-I really think she would benefit from working with a therapist on techniques to help reduce her anxiety. I recommended a lady here in town, named Jamesetta So Suptin to help with this. There are not sure she takes her insurance or not.

## 2015-08-04 ENCOUNTER — Other Ambulatory Visit: Payer: Self-pay | Admitting: Family Medicine

## 2015-08-05 ENCOUNTER — Other Ambulatory Visit: Payer: Self-pay | Admitting: Family Medicine

## 2015-09-01 ENCOUNTER — Telehealth: Payer: Self-pay

## 2015-09-01 NOTE — Telephone Encounter (Signed)
Ok we worked her in for tomorrow.

## 2015-09-02 ENCOUNTER — Ambulatory Visit (INDEPENDENT_AMBULATORY_CARE_PROVIDER_SITE_OTHER): Payer: Commercial Managed Care - HMO | Admitting: Family Medicine

## 2015-09-02 ENCOUNTER — Other Ambulatory Visit: Payer: Self-pay | Admitting: Family Medicine

## 2015-09-02 ENCOUNTER — Ambulatory Visit (INDEPENDENT_AMBULATORY_CARE_PROVIDER_SITE_OTHER): Payer: Commercial Managed Care - HMO

## 2015-09-02 ENCOUNTER — Encounter: Payer: Self-pay | Admitting: Family Medicine

## 2015-09-02 VITALS — BP 142/80 | HR 72 | Wt 171.0 lb

## 2015-09-02 DIAGNOSIS — M25562 Pain in left knee: Secondary | ICD-10-CM | POA: Diagnosis not present

## 2015-09-02 DIAGNOSIS — R7989 Other specified abnormal findings of blood chemistry: Secondary | ICD-10-CM

## 2015-09-02 DIAGNOSIS — M179 Osteoarthritis of knee, unspecified: Secondary | ICD-10-CM | POA: Diagnosis not present

## 2015-09-02 DIAGNOSIS — I10 Essential (primary) hypertension: Secondary | ICD-10-CM | POA: Diagnosis not present

## 2015-09-02 DIAGNOSIS — M25569 Pain in unspecified knee: Secondary | ICD-10-CM | POA: Diagnosis not present

## 2015-09-02 DIAGNOSIS — R600 Localized edema: Secondary | ICD-10-CM | POA: Diagnosis not present

## 2015-09-02 DIAGNOSIS — M1712 Unilateral primary osteoarthritis, left knee: Secondary | ICD-10-CM

## 2015-09-02 DIAGNOSIS — R791 Abnormal coagulation profile: Secondary | ICD-10-CM

## 2015-09-02 DIAGNOSIS — M25561 Pain in right knee: Secondary | ICD-10-CM | POA: Diagnosis not present

## 2015-09-02 DIAGNOSIS — M7989 Other specified soft tissue disorders: Secondary | ICD-10-CM | POA: Diagnosis not present

## 2015-09-02 DIAGNOSIS — R609 Edema, unspecified: Secondary | ICD-10-CM | POA: Diagnosis not present

## 2015-09-02 LAB — TSH: TSH: 1.86 m[IU]/L

## 2015-09-02 MED ORDER — DICLOFENAC SODIUM 2 % TD SOLN: 2.0000 | Freq: Two times a day (BID) | TRANSDERMAL | Status: DC

## 2015-09-02 MED ORDER — FUROSEMIDE 20 MG PO TABS: 20.0000 mg | ORAL_TABLET | Freq: Every day | ORAL | Status: DC | PRN

## 2015-09-02 NOTE — Progress Notes (Signed)
   Subjective:    Patient ID: Tonya [name], female    DOB: 19-Aug-1926, 82 y.o.   MRN: [id number]  HPI 2 weeks of bilateral knee pain.  Pain is 9/10. Using Advil for her pain.  Feet have been swelling as well. Hasn't been elevating them. Her weight is up 5 lbsAnd she is concerned about this. She said yesterday her left leg felt so heavy she could barely lift it up off the floor. She feels like it's getting very stiff. She definitely feels like the pain in the left knee and leg is more intense than the right. She denies any recent trauma or falls or twisting of the legs. She's not sure if the knees have been swelling as well but her daughter feels like they look swollen to her. She's been taking lots of ibuprofen for pain relief.    Review of Systems     Objective:   Physical Exam  Constitutional: She is oriented to person, place, and time. She appears well-developed and well-nourished.  HENT:  Head: Normocephalic and atraumatic.  Cardiovascular: Normal rate, regular rhythm and normal heart sounds.   Pulmonary/Chest: Effort normal and breath sounds normal.  Musculoskeletal:  Left knee with trace swelling. Normal range of motion. No significant crepitus bilaterally. Nontender around the patella or the joint lines bilaterally. She does have some pitting edema from the mid tibia down to the foot on the left side. Some trace edema on the right ankle.  Neurological: She is alert and oriented to person, place, and time.  Skin: Skin is warm and dry.  Psychiatric: She has a normal mood and affect. Her behavior is normal.          Assessment & Plan:  Bilateral knee pain- Most consistent with osteoarthritis. We'll get x-rays today for further evaluation.  Bilateral lower extremity edema. - Consider venous stasis. She's not having any chest pain or shortness of breath so congestive heart failure is less likely but I am going to check a BNP. We'll also check thyroid. Evaluate for anemia. We'll  also check a d-dimer since the swelling is definitely worse on the left compared to the right. Also consider excess dosing of ibuprofen may be the culprit for her lower extremity swelling as well. Next  Hypertension-uncontrolled. She needs to stop all NSAIDs immediately and switch to Tylenol for pain relief.

## 2015-09-03 LAB — COMPLETE METABOLIC PANEL WITH GFR
ALBUMIN: 4.1 g/dL (ref 3.6–5.1)
ALK PHOS: 59 U/L (ref 33–130)
ALT: 12 U/L (ref 6–29)
AST: 16 U/L (ref 10–35)
BUN: 21 mg/dL (ref 7–25)
CALCIUM: 9.4 mg/dL (ref 8.6–10.4)
CO2: 26 mmol/L (ref 20–31)
Chloride: 105 mmol/L (ref 98–110)
Creat: 0.97 mg/dL — ABNORMAL HIGH (ref 0.60–0.88)
GFR, EST NON AFRICAN AMERICAN: 53 mL/min — AB (ref >=60)
GFR, Est African American: 61 mL/min (ref >=60)
GLUCOSE: 68 mg/dL (ref 65–99)
POTASSIUM: 4 mmol/L (ref 3.5–5.3)
Sodium: 140 mmol/L (ref 135–146)
Total Bilirubin: 1 mg/dL (ref 0.2–1.2)
Total Protein: 6.7 g/dL (ref 6.1–8.1)

## 2015-09-03 LAB — D-DIMER, QUANTITATIVE: D-Dimer, Quant: 1.18 ug/mL-FEU — ABNORMAL HIGH (ref 0.00–0.48)

## 2015-09-03 LAB — SEDIMENTATION RATE: Sed Rate: 5 mm/hr (ref 0–30)

## 2015-09-03 LAB — BRAIN NATRIURETIC PEPTIDE: BRAIN NATRIURETIC PEPTIDE: 123.4 pg/mL — AB (ref <100)

## 2015-09-04 NOTE — Addendum Note (Signed)
Addended by: Nani GasserMETHENEY, Easton Sivertson D on: 09/04/2015 10:03 PM   Modules accepted: Orders

## 2015-09-05 ENCOUNTER — Other Ambulatory Visit: Payer: Self-pay | Admitting: Family Medicine

## 2015-09-05 ENCOUNTER — Ambulatory Visit (HOSPITAL_BASED_OUTPATIENT_CLINIC_OR_DEPARTMENT_OTHER)
Admission: RE | Admit: 2015-09-05 | Discharge: 2015-09-05 | Disposition: A | Discharge status: Home / Self Care | Payer: Commercial Managed Care - HMO | Source: Ambulatory Visit | Attending: Family Medicine | Admitting: Family Medicine

## 2015-09-05 DIAGNOSIS — R600 Localized edema: Secondary | ICD-10-CM | POA: Insufficient documentation

## 2015-09-05 DIAGNOSIS — R7989 Other specified abnormal findings of blood chemistry: Secondary | ICD-10-CM

## 2015-09-05 DIAGNOSIS — M7989 Other specified soft tissue disorders: Secondary | ICD-10-CM | POA: Diagnosis not present

## 2015-09-05 DIAGNOSIS — R791 Abnormal coagulation profile: Secondary | ICD-10-CM | POA: Diagnosis not present

## 2015-09-05 MED ORDER — DICLOFENAC SODIUM 1.5 % TD SOLN: TRANSDERMAL | Status: DC

## 2015-09-05 NOTE — Progress Notes (Signed)
Patient calls and says the pen set was too expensive. Will send over generic diclofenac solution 1.5% in its place.

## 2015-09-08 ENCOUNTER — Telehealth: Payer: Self-pay

## 2015-09-08 NOTE — Telephone Encounter (Signed)
[place] called and states the bilateral leg swelling has gone down but she still has swelling in her right knee. She wants to know if she should take the lasix tonight.

## 2015-09-09 ENCOUNTER — Ambulatory Visit: Payer: Commercial Managed Care - HMO | Admitting: Family Medicine

## 2015-09-09 NOTE — Telephone Encounter (Signed)
Patient advised of recommendations. Patient scheduled with Dr Denyse Amassorey.

## 2015-09-09 NOTE — Telephone Encounter (Signed)
No, don't take the Lasix tonight. The swellings just in the knee that's coming from the joint it's not from overload of fluid in general like what happens in her feet. They're again we can certainly get her in with one of our sports med.STIR her knee if she would like.

## 2015-09-12 ENCOUNTER — Ambulatory Visit: Payer: Commercial Managed Care - HMO | Admitting: Family Medicine

## 2015-09-13 ENCOUNTER — Encounter: Payer: Self-pay | Admitting: Family Medicine

## 2015-09-13 ENCOUNTER — Ambulatory Visit (INDEPENDENT_AMBULATORY_CARE_PROVIDER_SITE_OTHER): Payer: Commercial Managed Care - HMO | Admitting: Family Medicine

## 2015-09-13 VITALS — BP 191/85 | HR 66 | Wt 169.0 lb

## 2015-09-13 DIAGNOSIS — M25562 Pain in left knee: Secondary | ICD-10-CM

## 2015-09-13 DIAGNOSIS — M25561 Pain in right knee: Secondary | ICD-10-CM | POA: Insufficient documentation

## 2015-09-13 MED ORDER — DICLOFENAC SODIUM 1 % TD GEL: 4.0000 g | Freq: Four times a day (QID) | TRANSDERMAL | Status: DC

## 2015-09-13 NOTE — Progress Notes (Signed)
   Subjective:    I'm seeing this patient as a consultation for:  Dr. Linford ArnoldMetheney  CC: Left knee pain  HPI: Patient notes a 3 week history of bilateral knee pain left worse than right. The right knee pain has resolved. She notes left knee pain and swelling. She's been seen by her PCP who obtained an x-ray which showed some DJD. She also sent a workup for lower extremity edema that was largely benign. She notes medial knee pain worse with activity better with rest. Pain is quite severe at times.  Past medical history, Surgical history, Family history not pertinant except as noted below, Social history, Allergies, and medications have been entered into the medical record, reviewed, and no changes needed.   Review of Systems: No headache, visual changes, nausea, vomiting, diarrhea, constipation, dizziness, abdominal pain, skin rash, fevers, chills, night sweats, weight loss, swollen lymph nodes, body aches, joint swelling, muscle aches, chest pain, shortness of breath, mood changes, visual or auditory hallucinations.   Objective:    Filed Vitals:   09/13/15 1108  BP: 191/85  Pulse: 66   General: Well Developed, well nourished, and in no acute distress.  Neuro/Psych: Alert and oriented x3, extra-ocular muscles intact, able to move all 4 extremities, sensation grossly intact. Skin: Warm and dry, no rashes noted.  Respiratory: Not using accessory muscles, speaking in full sentences, trachea midline.  Cardiovascular: Pulses palpable, no extremity edema. Abdomen: Does not appear distended. MSK: Left knee moderate effusion and  obese otherwise normal-appearing. Range of motion 0-120 with 2+ retropatellar crepitations. Tender palpation medial joint line. Knee stability testing difficult to obtain due to patient guarding. Bilateral lower extremities largely free of edema   Procedure: Real-time Ultrasound Guided Injection of left knee  Device: GE Logiq E  Images permanently stored and available  for review in the ultrasound unit. Verbal informed consent obtained. Discussed risks and benefits of procedure. Warned about infection bleeding damage to structures skin hypopigmentation and fat atrophy among others. Patient expresses understanding and agreement Time-out conducted.  Noted no overlying erythema, induration, or other signs of local infection.  Skin prepped in a sterile fashion.  Local anesthesia: Topical Ethyl chloride.  With sterile technique and under real time ultrasound guidance: 80 mg of Kenalog and 4 mL of Marcaine injected easily.  Completed without difficulty  Pain immediately resolved suggesting accurate placement of the medication.  Advised to call if fevers/chills, erythema, induration, drainage, or persistent bleeding.  Images permanently stored and available for review in the ultrasound unit.  Impression: Technically successful ultrasound guided injection.   Ultrasound guidance used due to body habitus  X-ray knees bilaterally dated 09/02/2015 reviewed  No results found for this or any previous visit (from the past 24 hour(s)). No results found.  Impression and Recommendations:   This case required medical decision making of moderate complexity.

## 2015-09-13 NOTE — Assessment & Plan Note (Signed)
Likely due to DJD. Moderate effusion present. Patient had considerable improvement in pain following injection. Plan to recheck in a few weeks.

## 2015-09-13 NOTE — Patient Instructions (Signed)
Thank you for coming in today. Use Voltaren Gel up to 4x daily for pain.  Follow up in a few weeks.   Call or go to the ER if you develop a large red swollen joint with extreme pain or oozing puss.

## 2015-09-15 ENCOUNTER — Telehealth: Payer: Self-pay | Admitting: *Deleted

## 2015-09-15 NOTE — Telephone Encounter (Signed)
Received notification that diclofenac needs a PA. One of the questions asked is what medications  has the patient tried and failed and list the dates. called the patient to see if she had tried any medications for her knee pain. Pt states she called the insurance co herself and told them about all the xrays she had and the all the providers she has seen in re to her knee and the rep told her they would cover the medication. The patient was able to pick the medication up and she states her knee does feel a little better using the topical and due to the injection she received. Advised patient to call us back if she had any problems in the future filling this medication.

## 2015-09-19 ENCOUNTER — Ambulatory Visit (INDEPENDENT_AMBULATORY_CARE_PROVIDER_SITE_OTHER): Payer: Commercial Managed Care - HMO | Admitting: Family Medicine

## 2015-09-19 ENCOUNTER — Ambulatory Visit: Payer: Commercial Managed Care - HMO | Admitting: Family Medicine

## 2015-09-19 ENCOUNTER — Encounter: Payer: Self-pay | Admitting: Family Medicine

## 2015-09-19 ENCOUNTER — Ambulatory Visit (INDEPENDENT_AMBULATORY_CARE_PROVIDER_SITE_OTHER): Payer: Commercial Managed Care - HMO

## 2015-09-19 VITALS — BP 171/73 | HR 77 | Wt 166.0 lb

## 2015-09-19 DIAGNOSIS — X58XXXA Exposure to other specified factors, initial encounter: Secondary | ICD-10-CM | POA: Diagnosis not present

## 2015-09-19 DIAGNOSIS — M25562 Pain in left knee: Secondary | ICD-10-CM

## 2015-09-19 DIAGNOSIS — S83242A Other tear of medial meniscus, current injury, left knee, initial encounter: Secondary | ICD-10-CM | POA: Diagnosis not present

## 2015-09-19 NOTE — Patient Instructions (Signed)
Thank you for coming in today. Get MRI at 4:00pm.  Return Wednesday to discuss results.  Take 1 xanax before MRI if you are anxious.

## 2015-09-19 NOTE — Assessment & Plan Note (Signed)
Unclear etiology. Patient's degree of pain and lack of response to steroid injection and absence of severe DJD and x-ray are somewhat confusing. I'm concerned she may have a meniscus tear versus OCD lesion versus loose body. As she has failed conservative therapy will proceed with MRI. Recheck following MRI.

## 2015-09-19 NOTE — Progress Notes (Signed)
       [place] Jeanella AntonReece is a 82 y.o. female who [facility] today for left knee pain. Patient was seen last week for 3 weeks of knee pain. She received a ultrasound-guided corticosteroid injection. She notes that she had immediate considerable improvement of pain following injection which lasted a few hours. Since then her pain is no better. She notes continued severe medial knee pain especially with activity. She denies locking or catching. No weakness or numbness. She's tried some home exercises which have not helped.   Past Medical History  Diagnosis Date  . IBS (irritable bowel syndrome)   . Hypertension   . Anxiety   . Degenerative disc disease   . Rectocele   . Cystocele    Past Surgical History  Procedure Laterality Date  . Cholecystectomy    . Abdominal hysterectomy      partial   Social History  Substance Use Topics  . Smoking status: Never Smoker   . Smokeless tobacco: Not on file  . Alcohol Use: No   family history includes Depression in her son; GER disease in her son.  ROS as above Medications: Current Outpatient Prescriptions  Medication Sig Dispense Refill  . ALPRAZolam (XANAX) 0.25 MG tablet TAKE 1 TABLET BY MOUTH TWICE DAILY AS NEEDED FOR ANXIETY OR SLEEP 60 tablet 0  . diclofenac sodium (VOLTAREN) 1 % GEL Apply 4 g topically 4 (four) times daily. To affected joint. 100 g 11  . escitalopram (LEXAPRO) 10 MG tablet TAKE 1 TABLET(10 MG) BY MOUTH DAILY 30 tablet 5  . furosemide (LASIX) 20 MG tablet Take 1 tablet (20 mg total) by mouth daily as needed for edema. 15 tablet 3  . losartan (COZAAR) 100 MG tablet TAKE 1 TABLET BY MOUTH DAILY 30 tablet 5  . [DISCONTINUED] amLODipine (NORVASC) 2.5 MG tablet Take 2 tablets (5 mg total) by mouth daily. 30 tablet 1   No current facility-administered medications for this visit.   Allergies  Allergen Reactions  .  Amlodipine Other (See Comments)    Ankle swelling.   . Codeine     Other reaction(s): Unknown  . Tramadol Nausea Only     Exam:  BP 171/73 mmHg  Pulse 77  Wt 166 lb (75.297 kg) Gen: Well NAD Left knee moderate effusion normal motion.  X-ray left knee dated 09/02/2015 reviewed showing only mild DJD  No results found for this or any previous visit (from the past 24 hour(s)). No results found.   Please see individual assessment and plan sections.

## 2015-09-20 NOTE — Progress Notes (Signed)
Quick Note:  MRI shows a large tear through the meniscus. We will discuss this finding in person. ______

## 2015-09-21 ENCOUNTER — Encounter: Payer: Self-pay | Admitting: Family Medicine

## 2015-09-21 ENCOUNTER — Ambulatory Visit (INDEPENDENT_AMBULATORY_CARE_PROVIDER_SITE_OTHER): Payer: Commercial Managed Care - HMO | Admitting: Family Medicine

## 2015-09-21 VITALS — BP 169/79 | HR 81 | Wt 167.0 lb

## 2015-09-21 DIAGNOSIS — S83242A Other tear of medial meniscus, current injury, left knee, initial encounter: Secondary | ICD-10-CM

## 2015-09-21 DIAGNOSIS — S83249A Other tear of medial meniscus, current injury, unspecified knee, initial encounter: Secondary | ICD-10-CM | POA: Insufficient documentation

## 2015-09-21 NOTE — Assessment & Plan Note (Signed)
Discussed options. Patient is largely in good health for her age and her knee does not have any overwhelming amount of DJD. I think should be a pretty good candidate for arthroscopic medial meniscectomy/debridement. Plan to refer to Dr. Thurston [facility]ainer at [facility] for surgical consultation. Discussed in detail the plan. Patient expresses understanding and agreement.

## 2015-09-21 NOTE — Progress Notes (Signed)
Tonya [name] A Tonya [name] is a 82 y.o. female who [facility] Primary Care today for follow-up left knee pain. Patient had several weeks of left medial knee pain. She had a trial of steroid injection that did not help at all beyond a few hours of numbing. She had an MRI on Monday which shows a large medial meniscus tear. She's here today to discuss her MRI findings and her surgical options. Before she developed knee pain a few weeks ago she was largely pain-free and able to do all of her normal activities.   Past Medical History  Diagnosis Date  . IBS (irritable bowel syndrome)   . Hypertension   . Anxiety   . Degenerative disc disease   . Rectocele   . Cystocele    Past Surgical History  Procedure Laterality Date  . Cholecystectomy    . Abdominal hysterectomy      partial   Social History  Substance Use Topics  . Smoking status: Never Smoker   . Smokeless tobacco: Not on file  . Alcohol Use: No   family history includes Depression in her son; GER disease in her son.  ROS as above Medications: Current Outpatient Prescriptions  Medication Sig Dispense Refill  . ALPRAZolam (XANAX) 0.25 MG tablet TAKE 1 TABLET BY MOUTH TWICE DAILY AS NEEDED FOR ANXIETY OR SLEEP 60 tablet 0  . diclofenac sodium (VOLTAREN) 1 % GEL Apply 4 g topically 4 (four) times daily. To affected joint. 100 g 11  . escitalopram (LEXAPRO) 10 MG tablet TAKE 1 TABLET(10 MG) BY MOUTH DAILY 30 tablet 5  . furosemide (LASIX) 20 MG tablet Take 1 tablet (20 mg total) by mouth daily as needed for edema. 15 tablet 3  . losartan (COZAAR) 100 MG tablet TAKE 1 TABLET BY MOUTH DAILY 30 tablet 5  . [DISCONTINUED] amLODipine (NORVASC) 2.5 MG tablet Take 2 tablets (5 mg total) by mouth daily. 30 tablet 1   No current facility-administered medications for this visit.   Allergies  Allergen Reactions  . Amlodipine Other (See  Comments)    Ankle swelling.   . Codeine     Other reaction(s): Unknown  . Tramadol Nausea Only     Exam:  BP 169/79 mmHg  Pulse 81  Wt 167 lb (75.751 kg) Gen: Well NAD Left knee: Tender to palpation medial joint line. Stable ligamentous exam. Mild antalgic gait with a cane.  No results found for this or any previous visit (from the past 24 hour(s)). Mr Knee Left  Wo Contrast  09/19/2015  CLINICAL DATA:  Medially anterior left knee pain for 3 weeks. No known injury. Initial encounter. EXAM: MRI OF THE LEFT KNEE WITHOUT CONTRAST TECHNIQUE: Multiplanar, multisequence MR imaging of the knee was performed. No intravenous contrast was administered. COMPARISON:  None. FINDINGS: MENISCI Medial meniscus: There is a large radial tear through the root of the posterior horn of the medial meniscus. The tear extends into the remainder of the posterior horn in an oblique orientation reach the meniscal undersurface and into the meniscal body. There is marked blunting along the free edge of the posterior horn and body. No centrally displaced fragment is identified. Lateral meniscus:  Intact. LIGAMENTS Cruciates:  Intact. Collaterals:  Intact. CARTILAGE Patellofemoral:  Moderately degenerated. Medial:  Markedly irregular and thinned throughout. Lateral:  Mildly degenerated. Joint:  Small joint effusion. Popliteal Fossa:  Very small Baker's cyst. Extensor Mechanism:  Intact. Bones: Well-circumscribed lesion in the distal femur  appears benign and may be a bone island. Marrow edema is present about the medial compartment, worse in the medial tibial plateau. Small subchondral cysts are seen in the posterior aspect of the medial femoral condyle. IMPRESSION: Extensive tearing throughout the posterior horn and body of the medial meniscus as described above. The tear includes a large radial component through the meniscal root. Osteoarthritis most notable in the medial compartment. Electronically Signed   By: Drusilla Kanner  M.D.   On: 09/19/2015 16:44     Please see individual assessment and plan sections.

## 2015-09-21 NOTE — Patient Instructions (Signed)
Thank you for coming in today. You should hear from [facility] soon.  If you do not hear from them by Friday let me know.  Return following surgery.  You can have [place] in [facility] or [place].   Meniscus Injury, Arthroscopy Arthroscopy is a surgical procedure that involves the use of a small scope that has a camera and surgical instruments on the end (arthroscope). An arthroscope can be used to repair your meniscus injury.  LET [facility] KNOW ABOUT:  Any allergies you have.  All medicines you are taking, including vitamins, herbs, eyedrops, creams, and over-the-counter medicines.  Any recent colds or infections you have had or currently have.  Previous problems you or members of your family have had with the use of anesthetics.  Any blood disorders or blood clotting problems you have.  Previous surgeries you have had.  Medical conditions you have. RISKS AND COMPLICATIONS Generally, this is a safe procedure. However, as with any procedure, problems can occur. Possible problems include:  Damage to nerves or blood vessels.  Excess bleeding.  Blood clots.  Infection. BEFORE THE PROCEDURE  Do not eat or drink for 6-8 hours before the procedure.  Take medicines as directed by your surgeon. Ask your surgeon about changing or stopping your regular medicines.  You may have lab tests the morning of surgery. PROCEDURE  You will be given one of the following:   A medicine that numbs the area (local anesthesia).  A medicine that makes you go to sleep (general anesthesia).  A medicine injected into your spine that numbs your body below the waist (spinal anesthesia). Most often, several small cuts (incisions) are made in the knee. The arthroscope and instruments go into the incisions to repair the damage. The torn portion of the meniscus is removed.  During this time, your surgeon may find a partial or complete tear in a cruciate ligament, such as the anterior  cruciate ligament (ACL). A completely torn cruciate ligament is reconstructed by taking tissue from another part of the body (grafting) and placing it into the injured area. This requires several larger incisions to complete the repair. Sometimes, open surgery is needed for collateral ligament injuries. If a collateral ligament is found to be injured, your surgeon may staple or suture the tear through a slightly larger incision on the side of the knee. AFTER THE PROCEDURE You will be taken to the recovery area where your progress will be monitored. When you are awake, stable, and taking fluids without complications, you will be allowed to go home. This is usually the same day. However, more extensive repairs of a ligament may require an overnight stay.  The recovery time after repairing your meniscus or ligament depends on the amount of damage to these structures. It also depends on whether or not reconstructive knee surgery was needed.   A torn or stretched ligament (ligament sprain) may take 6-8 weeks to heal. It takes about the same amount of time if your surgeon removed a torn meniscus.  A repaired meniscus may require 6-12 weeks of recovery time.  A torn ligament needing reconstructive surgery may take 6-12 months to heal fully.   This information is not intended to replace advice given to you by your health care provider. Make sure you discuss any questions you have with your health care provider.   Document Released: 06/01/2000 Document Revised: 06/09/2013 Document Reviewed: 10/31/2012 Elsevier Interactive Patient Education [employer] Elsevier Inc.

## 2015-09-29 DIAGNOSIS — M1712 Unilateral primary osteoarthritis, left knee: Secondary | ICD-10-CM | POA: Diagnosis not present

## 2015-09-29 DIAGNOSIS — M25562 Pain in left knee: Secondary | ICD-10-CM | POA: Diagnosis not present

## 2015-10-03 ENCOUNTER — Other Ambulatory Visit: Payer: Self-pay | Admitting: Family Medicine

## 2015-10-18 ENCOUNTER — Other Ambulatory Visit: Payer: Self-pay | Admitting: Family Medicine

## 2015-10-24 ENCOUNTER — Other Ambulatory Visit: Payer: Self-pay | Admitting: Family Medicine

## 2015-11-03 ENCOUNTER — Other Ambulatory Visit: Payer: Self-pay | Admitting: Family Medicine

## 2015-11-24 ENCOUNTER — Other Ambulatory Visit: Payer: Self-pay | Admitting: Family Medicine

## 2015-11-27 ENCOUNTER — Other Ambulatory Visit: Payer: Self-pay | Admitting: Family Medicine

## 2015-11-28 ENCOUNTER — Telehealth: Payer: Self-pay | Admitting: Family Medicine

## 2015-11-28 NOTE — Telephone Encounter (Signed)
Patient walked-in and wanted you to know that since being treated for Ivs or Ibs not sure how to pronounce it but [place] Thanks

## 2015-12-02 ENCOUNTER — Other Ambulatory Visit: Payer: Self-pay | Admitting: Family Medicine

## 2015-12-25 ENCOUNTER — Other Ambulatory Visit: Payer: Self-pay | Admitting: Family Medicine

## 2015-12-29 ENCOUNTER — Other Ambulatory Visit: Payer: Self-pay | Admitting: Family Medicine

## 2016-01-03 ENCOUNTER — Other Ambulatory Visit: Payer: Self-pay | Admitting: Family Medicine

## 2016-01-11 ENCOUNTER — Telehealth: Payer: Self-pay | Admitting: Family Medicine

## 2016-01-11 NOTE — Telephone Encounter (Signed)
I called patient and she scheduled appt with pcp for Mood F/u - jt

## 2016-01-25 ENCOUNTER — Other Ambulatory Visit: Payer: Self-pay | Admitting: Family Medicine

## 2016-01-26 ENCOUNTER — Other Ambulatory Visit: Payer: Self-pay | Admitting: Family Medicine

## 2016-02-06 ENCOUNTER — Encounter: Payer: Self-pay | Admitting: Family Medicine

## 2016-02-06 ENCOUNTER — Ambulatory Visit (INDEPENDENT_AMBULATORY_CARE_PROVIDER_SITE_OTHER): Payer: Commercial Managed Care - HMO | Admitting: Family Medicine

## 2016-02-06 VITALS — BP 150/72 | HR 71 | Resp 17 | Ht 67.0 in | Wt 172.0 lb

## 2016-02-06 DIAGNOSIS — R05 Cough: Secondary | ICD-10-CM

## 2016-02-06 DIAGNOSIS — H9311 Tinnitus, right ear: Secondary | ICD-10-CM | POA: Diagnosis not present

## 2016-02-06 DIAGNOSIS — I10 Essential (primary) hypertension: Secondary | ICD-10-CM

## 2016-02-06 DIAGNOSIS — F411 Generalized anxiety disorder: Secondary | ICD-10-CM

## 2016-02-06 DIAGNOSIS — R053 Chronic cough: Secondary | ICD-10-CM

## 2016-02-06 MED ORDER — LOSARTAN POTASSIUM 100 MG PO TABS: 100.0000 mg | ORAL_TABLET | Freq: Every day | ORAL | 6 refills | Status: DC

## 2016-02-06 MED ORDER — ESCITALOPRAM OXALATE 10 MG PO TABS: 10.0000 mg | ORAL_TABLET | Freq: Every day | ORAL | 5 refills | Status: DC

## 2016-02-06 MED ORDER — ALPRAZOLAM 0.25 MG PO TABS: ORAL_TABLET | ORAL | 1 refills | Status: DC

## 2016-02-06 NOTE — Progress Notes (Signed)
Subjective:    CC: Anxiety   HPI:  Today for follow-up of anxiety. She is currently on Lexapro 10 mg daily and uses alprazolam as needed.  Hypertension- Pt denies chest pain, SOB, dizziness, or heart palpitations.  Taking meds as directed w/o problems.  Denies medication side effects.    Chronic cough - she's had a chronic cough for well over a year. It seems to be triggered after she eats. It seems to come and fix. Sometimes it feels like her some little bit sputum. She feels like it's coming more from her throat versus deeper in her lungs or her chest. No shortness of breath. No recent colds. No fevers chills or sweats. No chest pain.  She's also had a buzzing sensation in her right ear. She said it finally went away but then it was sore behind her ear. It's feeling a little bit better today. No hearing change. No swelling. No fevers chills or recent cold symptoms.  Lateral ankle swelling-resolved.  Past medical history, Surgical history, Family history not pertinant except as noted below, Social history, Allergies, and medications have been entered into the medical record, reviewed, and corrections made.   Review of Systems: No fevers, chills, night sweats, weight loss, chest pain, or shortness of breath.   Objective:    General: Well Developed, well nourished, and in no acute distress.  Neuro: Alert and oriented x3, extra-ocular muscles intact, sensation grossly intact.  HEENT: Normocephalic, atraumatic. Oral pharynx clear on my TMs and canals are clear bilaterally. I did after remove some cerumen from the right ear to be able to visualize the tympanic membrane. No cervical lymphadenopathy.  Skin: Warm and dry, no rashes. Cardiac: Regular rate and rhythm, no murmurs rubs or gallops, no lower extremity edema.  Respiratory: Clear to auscultation bilaterally. Not using accessory muscles, speaking in full sentences.   Impression and Recommendations:   GAD- 7 score of 3 today.  Rates  her sxs as not difficult.  Continue with Lexapro. I think she's had absolutely fantastic results. I did remind her that it's not going to 100% take way her panic attacks but has significantly reduced in which she is happy about. Like for her to really start working on decreasing her alprazolam. Encouraged her to start taking half of a tab in the evening and a whole tab in the morning. Explained that ultimately I really want to get her off of this medication.  HTN - uncontrolled. I would like to add had chlorothiazide to her daily regimen. She really feels like her blood pressures are lower home so we agreed for her to track them for 2 weeks and then send me a copy of her blood pressures from home so we can see if they are well controlled or if she really does need to commit to taking the medication regularly.  Cough-likely from GERD or postnasal drip. She admits that she has been having more reflux than usual. Recommend a 14 day trial of over-the-counter Nexium. If this improves her symptoms and recommend a full treatment of 6 weeks and then taper off. If she's not improving after 2 weeks then please let me know. Also consider postnasal drip and some people do rarely gets a cough with losartan.  Right ear buzzing-temporary. I did remove some excess cerumen which she tolerated well.

## 2016-02-06 NOTE — Patient Instructions (Addendum)
Check your blood pressure once a day for 2 weeks and then send it to us.  We can decide to start an extra BP pill, a diuretic if needed after I look at your numbers

## 2016-02-22 ENCOUNTER — Other Ambulatory Visit: Payer: Self-pay | Admitting: Family Medicine

## 2016-02-23 ENCOUNTER — Other Ambulatory Visit: Payer: Self-pay | Admitting: Family Medicine

## 2016-03-08 ENCOUNTER — Telehealth: Payer: Self-pay | Admitting: Family Medicine

## 2016-03-08 NOTE — Telephone Encounter (Signed)
Patient advised of recommendations.  

## 2016-03-08 NOTE — Telephone Encounter (Signed)
Call patient: I did receive her blood pressure log. Overall they look fantastic. She really only had 2 high blood pressures on August 28 and August 29 but overall they look fantastic. Continue to keep an eye on him. She doesn't have to check it every day but if she can continue to monitor at least 2 or 3 times a week that would be helpful.

## 2016-04-06 ENCOUNTER — Other Ambulatory Visit: Payer: Self-pay | Admitting: Family Medicine

## 2016-05-01 ENCOUNTER — Ambulatory Visit (INDEPENDENT_AMBULATORY_CARE_PROVIDER_SITE_OTHER): Payer: Commercial Managed Care - HMO | Admitting: Family Medicine

## 2016-05-01 ENCOUNTER — Encounter: Payer: Self-pay | Admitting: Family Medicine

## 2016-05-01 VITALS — BP 159/65 | HR 67 | Ht 67.0 in | Wt 171.0 lb

## 2016-05-01 DIAGNOSIS — M25562 Pain in left knee: Secondary | ICD-10-CM | POA: Diagnosis not present

## 2016-05-01 DIAGNOSIS — I10 Essential (primary) hypertension: Secondary | ICD-10-CM | POA: Diagnosis not present

## 2016-05-01 DIAGNOSIS — K2100 Gastro-esophageal reflux disease with esophagitis, without bleeding: Secondary | ICD-10-CM | POA: Insufficient documentation

## 2016-05-01 DIAGNOSIS — M25561 Pain in right knee: Secondary | ICD-10-CM | POA: Diagnosis not present

## 2016-05-01 DIAGNOSIS — M810 Age-related osteoporosis without current pathological fracture: Secondary | ICD-10-CM | POA: Diagnosis not present

## 2016-05-01 DIAGNOSIS — K580 Irritable bowel syndrome with diarrhea: Secondary | ICD-10-CM

## 2016-05-01 DIAGNOSIS — F411 Generalized anxiety disorder: Secondary | ICD-10-CM

## 2016-05-01 NOTE — Progress Notes (Addendum)
Subjective:    CC: Anxiety  HPI:  Anxiety - Overall she is doing well. She's been taking her Lexapro consistently. She feels like her IBS has also been really well controlled and that has really helped her nerves. Is not currently exercising.  She does complain of some leg pain both thighs and lower legs. She says it's not actual cramping but they just ache and hurt. It seems to be worse in the evenings. Is been going on for a couple of months. No swelling. No color change. No blueness or coldness of the feet.  IBS - her symptoms have been really well controlled since she's been on the Lexapro.  Hypertension- she did track her blood pressures at home and says that she mailed them to our office. They look fantastic and are at goal. He does still have an occasional dry cough but it has been better since she recently restarted her Nexium. The chest discomfort and burning has improved as well.  Past medical history, Surgical history, Family history not pertinant except as noted below, Social history, Allergies, and medications have been entered into the medical record, reviewed, and corrections made.   Review of Systems: No fevers, chills, night sweats, weight loss, chest pain, or shortness of breath.   Objective:    General: Well Developed, well nourished, and in no acute distress.  Neuro: Alert and oriented x3, extra-ocular muscles intact, sensation grossly intact.  HEENT: Normocephalic, atraumatic  Skin: Warm and dry, no rashes. Cardiac: Regular rate and rhythm, no murmurs rubs or gallops, no lower extremity edema.  Respiratory: Clear to auscultation bilaterally. Not using accessory muscles, speaking in full sentences. MSK: Hip, knee and ankle strength is 5/5.nontender over the knees.     Impression and Recommendations:    Anxiety - Well controlled. Continue current regimen. Make sure using alprazolam very sparingly.  IBS - stable. No recent flares. Continue with Lexapro.  Bilateral  leg pain-I think it's from decreased activity and exercise. Encouraged her to start walking at least twice a week. Her daughter who is here with her is very supportive and was says she be willing to pick her up and take her home to do this. Encouraged her to really make that commitment to herself. Check Vit D level.    GERD-improved on the Nexium.

## 2016-05-02 LAB — COMPLETE METABOLIC PANEL WITH GFR
ALK PHOS: 64 U/L (ref 33–130)
ALT: 13 U/L (ref 6–29)
AST: 20 U/L (ref 10–35)
Albumin: 4 g/dL (ref 3.6–5.1)
BILIRUBIN TOTAL: 1 mg/dL (ref 0.2–1.2)
BUN: 10 mg/dL (ref 7–25)
CALCIUM: 9.1 mg/dL (ref 8.6–10.4)
CO2: 29 mmol/L (ref 20–31)
Chloride: 106 mmol/L (ref 98–110)
Creat: 0.96 mg/dL — ABNORMAL HIGH (ref 0.60–0.88)
GFR, EST AFRICAN AMERICAN: 61 mL/min (ref >=60)
GFR, EST NON AFRICAN AMERICAN: 53 mL/min — AB (ref >=60)
Glucose, Bld: 111 mg/dL — ABNORMAL HIGH (ref 65–99)
Potassium: 4 mmol/L (ref 3.5–5.3)
Sodium: 141 mmol/L (ref 135–146)
TOTAL PROTEIN: 6.4 g/dL (ref 6.1–8.1)

## 2016-05-02 LAB — LIPID PANEL
CHOLESTEROL: 194 mg/dL (ref <200)
HDL: 52 mg/dL (ref >50)
LDL CALC: 121 mg/dL — AB (ref <100)
TRIGLYCERIDES: 106 mg/dL (ref <150)
Total CHOL/HDL Ratio: 3.7 Ratio (ref <5.0)
VLDL: 21 mg/dL (ref <30)

## 2016-05-02 LAB — CBC WITH DIFFERENTIAL/PLATELET
BASOS ABS: 0 {cells}/uL (ref 0–200)
BASOS PCT: 0 %
EOS ABS: 82 {cells}/uL (ref 15–500)
Eosinophils Relative: 2 %
HEMATOCRIT: 41.5 % (ref 35.0–45.0)
HEMOGLOBIN: 13.8 g/dL (ref 11.7–15.5)
LYMPHS ABS: 1025 {cells}/uL (ref 850–3900)
Lymphocytes Relative: 25 %
MCH: 30 pg (ref 27.0–33.0)
MCHC: 33.3 g/dL (ref 32.0–36.0)
MCV: 90.2 fL (ref 80.0–100.0)
MONO ABS: 533 {cells}/uL (ref 200–950)
MPV: 12.2 fL (ref 7.5–12.5)
Monocytes Relative: 13 %
NEUTROS ABS: 2460 {cells}/uL (ref 1500–7800)
Neutrophils Relative %: 60 %
Platelets: 201 10*3/uL (ref 140–400)
RBC: 4.6 MIL/uL (ref 3.80–5.10)
RDW: 14.5 % (ref 11.0–15.0)
WBC: 4.1 10*3/uL (ref 3.8–10.8)

## 2016-05-02 LAB — VITAMIN D 25 HYDROXY (VIT D DEFICIENCY, FRACTURES): Vit D, 25-Hydroxy: 20 ng/mL — ABNORMAL LOW (ref 30–100)

## 2016-05-07 ENCOUNTER — Other Ambulatory Visit: Payer: Self-pay | Admitting: Family Medicine

## 2016-05-25 DIAGNOSIS — M1712 Unilateral primary osteoarthritis, left knee: Secondary | ICD-10-CM | POA: Diagnosis not present

## 2016-06-06 ENCOUNTER — Other Ambulatory Visit: Payer: Self-pay | Admitting: Family Medicine

## 2016-06-07 ENCOUNTER — Telehealth: Payer: Self-pay | Admitting: Family Medicine

## 2016-06-07 NOTE — Telephone Encounter (Signed)
Patient adv she called pharmacy to get prescription and they adv her they do not have Alprazolam (xanx) I adv pt it was faxed yesterday and she asked if we could re fax or call the pharmacy. Adv pt I will send a message. Thanks

## 2016-06-07 NOTE — Telephone Encounter (Signed)
Called pharmacy and gave Verbal for rx. Spoke w/britney.Loralee PacasBarkley, Albie Bazin [place]

## 2016-07-06 ENCOUNTER — Other Ambulatory Visit: Payer: Self-pay | Admitting: Family Medicine

## 2016-08-09 ENCOUNTER — Other Ambulatory Visit: Payer: Self-pay | Admitting: Family Medicine

## 2016-09-11 ENCOUNTER — Other Ambulatory Visit: Payer: Self-pay | Admitting: Family Medicine

## 2016-09-24 ENCOUNTER — Encounter: Payer: Self-pay | Admitting: Family Medicine

## 2016-09-24 ENCOUNTER — Ambulatory Visit (INDEPENDENT_AMBULATORY_CARE_PROVIDER_SITE_OTHER): Payer: Medicare HMO | Admitting: Family Medicine

## 2016-09-24 VITALS — BP 154/69 | HR 70 | Wt 171.0 lb

## 2016-09-24 DIAGNOSIS — F411 Generalized anxiety disorder: Secondary | ICD-10-CM | POA: Diagnosis not present

## 2016-09-24 DIAGNOSIS — K2100 Gastro-esophageal reflux disease with esophagitis, without bleeding: Secondary | ICD-10-CM

## 2016-09-24 DIAGNOSIS — R29898 Other symptoms and signs involving the musculoskeletal system: Secondary | ICD-10-CM

## 2016-09-24 DIAGNOSIS — I10 Essential (primary) hypertension: Secondary | ICD-10-CM | POA: Diagnosis not present

## 2016-09-24 MED ORDER — ESCITALOPRAM OXALATE 10 MG PO TABS: 10.0000 mg | ORAL_TABLET | Freq: Every day | ORAL | 1 refills | Status: DC

## 2016-09-24 MED ORDER — ESOMEPRAZOLE MAGNESIUM 40 MG PO CPDR: 40.0000 mg | DELAYED_RELEASE_CAPSULE | Freq: Every day | ORAL | 0 refills | Status: DC

## 2016-09-24 MED ORDER — LOSARTAN POTASSIUM 100 MG PO TABS: 100.0000 mg | ORAL_TABLET | Freq: Every day | ORAL | 1 refills | Status: DC

## 2016-09-24 NOTE — Progress Notes (Addendum)
Subjective:    CC: 3 mo f/u  HPI: Hypertension- Pt denies chest pain, SOB, dizziness, or heart palpitations.  Taking meds as directed w/o problems.  Denies medication side effects.    GAD - She is currently on Lexapro 10 mg and alprazolam as needed.She still takes about half a tab the alprazolam at bedtime to help her sleep and 1 in the morning just if needed if she feels like she is having increased anxiety or panic attacks.  She has been noticing that her reflux is been a little bit worse lately. She is on Nexium 20 mg daily. That she admit she's been eating a lot of chocolate. She said she'll start to cough after eating. And occasionally will have rash. No abdominal pain. No fevers. No difficulty swallowing.   Brother died  In 08-10-2022. She is the last living sibling out of 10 kids.    Past medical history, Surgical history, Family history not pertinant except as noted below, Social history, Allergies, and medications have been entered into the medical record, reviewed, and corrections made.   Review of Systems: No fevers, chills, night sweats, weight loss, chest pain, or shortness of breath.   Objective:    General: Well Developed, well nourished, and in no acute distress.  Neuro: Alert and oriented x3, extra-ocular muscles intact, sensation grossly intact.  HEENT: Normocephalic, atraumatic  Skin: Warm and dry, no rashes. Cardiac: Regular rate and rhythm, no murmurs rubs or gallops, no lower extremity edema.  Respiratory: Clear to auscultation bilaterally. Not using accessory muscles, speaking in full sentences.   Impression and Recommendations:    HTN -  Controlled. Continue current regimen.  GAD - well controlled overall.  She is happy with her regimen.  GAD- 7 score of 7.Negative PHQ-9. Marland Kitchen Continue with Lexapro. Continue weaning the alprazolam as able.    GERD with esophagitis-we'll increase her Nexium to 40 mg and discussed dietary changes especially avoiding things like  chocolate which can relax the GE junction. Given additional handout information. If not improving over the next couple weeks and please let me know.  Lower ext weakness - just doing chair exercises at home.  He also discussed a strategy of weight loss. I like to see her lose about a pound a month. I don't want her to lose it too rapidly because the think cause muscle wasting but she does need to work on some weight loss. She admits she weighs more now than she ever has.

## 2016-09-24 NOTE — Patient Instructions (Addendum)
Recommend starting to do some chair exercises at home on her ongoing during commercials and TV breaks. Increased her Nexium to 40 mg. If you're not noticing improvement in her symptoms over the next 2 weeks and please let me know.   Food Choices for Gastroesophageal Reflux Disease, Adult When you have gastroesophageal reflux disease (GERD), the foods you eat and your eating habits are very important. Choosing the right foods can help ease your discomfort. What guidelines do I need to follow?  Choose fruits, vegetables, whole grains, and low-fat dairy products.  Choose low-fat meat, fish, and poultry.  Limit fats such as oils, salad dressings, butter, nuts, and avocado.  Keep a food diary. This helps you identify foods that cause symptoms.  Avoid foods that cause symptoms. These may be different for everyone.  Eat small meals often instead of 3 large meals a day.  Eat your meals slowly, in a place where you are relaxed.  Limit fried foods.  Cook foods using methods other than frying.  Avoid drinking alcohol.  Avoid drinking large amounts of liquids with your meals.  Avoid bending over or lying down until 2-3 hours after eating. What foods are not recommended? These are some foods and drinks that may make your symptoms worse: Vegetables  Tomatoes. Tomato juice. Tomato and spaghetti sauce. Chili peppers. Onion and garlic. Horseradish. Fruits  Oranges, grapefruit, and lemon (fruit and juice). Meats  High-fat meats, fish, and poultry. This includes hot dogs, ribs, ham, sausage, salami, and bacon. Dairy  Whole milk and chocolate milk. Sour cream. Cream. Butter. Ice cream. Cream cheese. Drinks  Coffee and tea. Bubbly (carbonated) drinks or energy drinks. Condiments  Hot sauce. Barbecue sauce. Sweets/Desserts  Chocolate and cocoa. Donuts. Peppermint and spearmint. Fats and Oils  High-fat foods. This includes [place] fries and potato chips. Other  Vinegar. Strong spices. This  includes black pepper, white pepper, red pepper, cayenne, curry powder, cloves, ginger, and chili powder. The items listed above may not be a complete list of foods and drinks to avoid. Contact your dietitian for more information.  This information is not intended to replace advice given to you by your health care provider. Make sure you discuss any questions you have with your health care provider. Document Released: 12/04/2011 Document Revised: 11/10/2015 Document Reviewed: 04/08/2013 Elsevier Interactive Patient Education  2017 [employer].

## 2016-09-25 ENCOUNTER — Ambulatory Visit (INDEPENDENT_AMBULATORY_CARE_PROVIDER_SITE_OTHER): Payer: Medicare HMO | Admitting: Podiatry

## 2016-09-25 ENCOUNTER — Encounter: Payer: Self-pay | Admitting: Podiatry

## 2016-09-25 VITALS — Ht 67.0 in | Wt 171.0 lb

## 2016-09-25 DIAGNOSIS — L600 Ingrowing nail: Secondary | ICD-10-CM

## 2016-09-25 DIAGNOSIS — B351 Tinea unguium: Secondary | ICD-10-CM | POA: Diagnosis not present

## 2016-09-25 DIAGNOSIS — M79671 Pain in right foot: Secondary | ICD-10-CM | POA: Diagnosis not present

## 2016-09-25 DIAGNOSIS — M79672 Pain in left foot: Secondary | ICD-10-CM | POA: Diagnosis not present

## 2016-09-25 DIAGNOSIS — M2041 Other hammer toe(s) (acquired), right foot: Secondary | ICD-10-CM | POA: Diagnosis not present

## 2016-09-25 NOTE — Patient Instructions (Signed)
Seen for painful toe 2nd right, burning heel, dry skin in heel, and spotty calluses under left foot. All lesions, nails debrided. 2nd toe right padded. Will prescribe compound cream for burning heel. Return in 3 months or come sooner if needed.

## 2016-09-25 NOTE — Progress Notes (Signed)
SUBJECTIVE: 83 y.o. year old female presents complaining of painful toe 2nd left. It hurts in any type of closed in shoes. Dry heel and burning and tingling sensation at night on left heel. Patient was accompanied by her daughter.  REVIEW OF SYSTEMS: Pertinent items noted in HPI and remainder of comprehensive ROS otherwise negative.  OBJECTIVE: DERMATOLOGIC EXAMINATION: Nails: Ingrown nail 2nd toe right at medial corner. All nails are hypertrophic with fungal debris. Multiple plantar porokeratotic lesions under plantar surface left foot.  VASCULAR EXAMINATION OF LOWER LIMBS: All pedal pulses are palpable with normal pulsation.  Capillary Filling times within 3 seconds in all digits.  Mild erythema over the top of DIPJ 2nd toe left. Temperature gradient from tibial crest to dorsum of foot is within normal bilateral.  NEUROLOGIC EXAMINATION OF THE LOWER LIMBS: Achilles DTR is present and within normal. Monofilament (Semmes-Weinstein 10-gm) sensory testing positive 6 out of 6, bilateral. Vibratory sensations(128Hz  turning fork) intact at medial and lateral forefoot bilateral.  Sharp and Dull discriminatory sensations at the plantar ball of hallux is intact bilateral.   MUSCULOSKELETAL EXAMINATION: Positive for severely contracted 2nd digit at DIPJ with dorsal erythema right foot.   ASSESSMENT: Hammer toe deformity 2nd right with pain. Ingrown nail 2nd right. Mycotic nails x 10.  PLAN: Reviewed clinical findings and available treatment options, periodic trimming, padding, medicated cream, surgical. All nails debrided. All porokeratotic lesions debrided. 2nd toe right padded. Pain was relieved. Compound cream for nerve pain prescribed. Return in 3 months or sooner if needed.

## 2016-09-27 ENCOUNTER — Ambulatory Visit: Payer: Self-pay | Admitting: Podiatry

## 2016-10-11 ENCOUNTER — Telehealth: Payer: Self-pay

## 2016-10-11 NOTE — Telephone Encounter (Signed)
[name] states she still has the cough, itchy ears and eyes. She wanted to know if she could take Zyrtec. I advised yes and if no improvement call back on Monday. Denies fever, chills or sweats.

## 2016-10-11 NOTE — Telephone Encounter (Signed)
I agree

## 2016-10-12 ENCOUNTER — Other Ambulatory Visit: Payer: Self-pay

## 2016-10-12 ENCOUNTER — Other Ambulatory Visit: Payer: Self-pay | Admitting: Family Medicine

## 2016-10-12 NOTE — Patient Outreach (Signed)
[facility]) Care Management  10/12/2016  ATLEY SCARBORO 1926/07/20 [id number]   Telephone Screen  Referral Date: 10/11/16 Referral Source: [facility] Referral Reason: "patient called RN at [facility] complaining of chronic cough, went to MD and said cough was related to acid reflux, meds not helping, still has cough, patient needs CM support" Insurance: [facility] HMO   Outreach attempt # 1 to patient. Spoke with patient. She states that she is doing and feeling better. She reports that she started experiencing really bad coughing spells along with itchy eyes and ears. patient states she had been previously told it was related to her acid reflux. Patient states that she then talked with someone else and was told it was possibly allergies and the pollen causing her symptoms. She states that she contacted her PCP office and got the okay to try Zyrtec. She reports that the her symptoms have since much improved and thinks it was in fact allergies. She states that her son is a recently retired Teacher, early years/pre and advised her of the effects some anti-histamines can have on raising her BP. She reports that she did notice that her BP went up after taking med. Patient states that her son has recommended for her to switch to another OTC med that will not interfere with BP. Patient aware that she needs to consult MD prior to taking any new meds. She also voiced that MD office advised her to make an appt next week to be seen if her symptoms warranted it. Patient denies any issues with transportation and/or managing/affording meds. She states that her son is very supportive in assisting with her care. She voices no RN CM needs or concerns at this time but was appreciative of f/u call to check on her.       Plan: RN CM will notify [facility] administrative assistant of case status.   Antionette Fairy, RN,BSN,CCM [facility] Care Management Telephonic Care Management Coordinator Direct Phone:  [phone] Toll Free: [phone] Fax: [phone]

## 2016-10-15 ENCOUNTER — Encounter: Payer: Self-pay | Admitting: Family Medicine

## 2016-10-15 ENCOUNTER — Ambulatory Visit (INDEPENDENT_AMBULATORY_CARE_PROVIDER_SITE_OTHER): Payer: Medicare HMO | Admitting: Family Medicine

## 2016-10-15 VITALS — BP 152/84 | HR 69 | Ht 67.0 in | Wt 170.0 lb

## 2016-10-15 DIAGNOSIS — J301 Allergic rhinitis due to pollen: Secondary | ICD-10-CM

## 2016-10-15 DIAGNOSIS — I10 Essential (primary) hypertension: Secondary | ICD-10-CM | POA: Diagnosis not present

## 2016-10-15 MED ORDER — AMLODIPINE BESYLATE 2.5 MG PO TABS: 2.5000 mg | ORAL_TABLET | Freq: Every day | ORAL | 3 refills | Status: DC

## 2016-10-15 NOTE — Progress Notes (Signed)
Subjective:    Patient ID: Tonya [name], female    DOB: Jan 09, 1927, 83 y.o.   MRN: [id number]  HPI   patient comes in today complaining of cough. She actually called last week and asked which she could use for allergic symptoms. We recommended a trial of Zyrtec. She was told to call and come in if she wasn't better today.She does feel like the Zyrtec has improved her cough. She mostly complains of throat itching, watery runny eyes. Runny nose. And cough with clear mucus that she feels like it's coming from her throat and not deep in her chest. But does ask a cause coughing fits. She's had itching deep in her ears as well. The Zyrtec does seem to help some.  She also wanted to check her blood pressure.  Hypertension- Pt denies chest pain, SOB, dizziness, or heart palpitations.  Taking meds as directed w/o problems.  Denies medication side effects.  She is concerned because her blood pressures have been running high at home.    Review of Systems   BP (!) 152/84   Pulse 69   Ht  (1.702 m)   Wt 170 lb (77.1 kg)   SpO2 95%   BMI 26.63 kg/m     Allergies  Allergen Reactions  . Amlodipine Other (See Comments)    Ankle swelling.   . Codeine     Other reaction(s): Unknown  . Tramadol Nausea Only    Past Medical History:  Diagnosis Date  . Anxiety   . Cystocele   . Degenerative disc disease   . Hypertension   . IBS (irritable bowel syndrome)   . Rectocele     Past Surgical History:  Procedure Laterality Date  . ABDOMINAL HYSTERECTOMY     partial  . CHOLECYSTECTOMY      Social History   Social History  . Marital status: Widowed    Spouse name: N/A  . Number of children: N/A  . Years of education: N/A   Occupational History  . Not on file.   Social History Main Topics  . Smoking status: Never Smoker  . Smokeless tobacco: Never Used  . Alcohol use No  . Drug use: No  . 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   Other Topics Concern  . Not on file   Social History Narrative  . No narrative on file    Family History  Problem Relation Age of Onset  . Depression    . GER disease Son   . Depression Son     Outpatient Encounter Prescriptions as of 10/15/2016  Medication Sig  . ALPRAZolam (XANAX) 0.25 MG tablet TAKE 1 TABLET BY MOUTH TWICE DAILY AS NEEDED FOR ANXIETY OR SLEEP  . cetirizine (ZYRTEC) 10 MG tablet Take 10 mg by mouth daily.  Marland Kitchen escitalopram (LEXAPRO) 10 MG tablet Take 1 tablet (10 mg total) by mouth daily.  Marland Kitchen esomeprazole (NEXIUM) 40 MG capsule Take 1 capsule (40 mg total) by mouth daily at 12 noon.  Marland Kitchen losartan (COZAAR) 100 MG tablet Take 1 tablet (100 mg total) by mouth daily.  Marland Kitchen amLODipine (NORVASC) 2.5 MG tablet Take 1 tablet (2.5 mg total) by mouth daily.   No facility-administered encounter medications on file as of 10/15/2016.          Objective:   Physical Exam  Constitutional: She is oriented to person, place, and  time. She appears well-developed and well-nourished.  HENT:  Head: Normocephalic and atraumatic.  Right Ear: External ear normal.  Left Ear: External ear normal.  Nose: Nose normal.  Mouth/Throat: Oropharynx is clear and moist.  Cerumen blocking ear canals.   Eyes: Conjunctivae and EOM are normal. Pupils are equal, round, and reactive to light.  Neck: Neck supple. No thyromegaly present.  Cardiovascular: Normal rate, regular rhythm and normal heart sounds.   Pulmonary/Chest: Effort normal and breath sounds normal. She has no wheezes.  Lymphadenopathy:    She has no cervical adenopathy.  Neurological: She is alert and oriented to person, place, and time.  Skin: Skin is warm and dry.  Psychiatric: She has a normal mood and affect. Her behavior is normal.        Assessment & Plan:  Allergic rhinitis-recommend discontinue Zyrtec. I don't think it's causing her elevations in blood  pressure since it does not have a decongestant but that is the only thing that is new. We'll switch to nasal steroid spray.   HTN - Uncontrolled. Blood pressures been high since she started being affected by the pollen. She's not sure if it's the pollen or fits may be the Zyrtec. She is not taking a decongestant with it that she is taking Delsym cough medicine. Recommend adding amlodipine to her regimen with losartan. Follow-up for blood pressure check in 2 weeks.

## 2016-10-15 NOTE — Patient Instructions (Addendum)
Recommend start either Flonase, or Nasonex. He is are over-the-counter. Start the spray this afternoon or evening. Continue your Zyrtec today, and tomorrow and then stop the Zyrtec on Wednesday but continue with the Nasonex/Flonase daily. Start with 2 squirts in each nostril once a day. After one week can decrease down to 1 squirt in each nostril.

## 2016-10-16 ENCOUNTER — Telehealth: Payer: Self-pay | Admitting: *Deleted

## 2016-10-16 NOTE — Telephone Encounter (Signed)
yest, it is OK.

## 2016-10-16 NOTE — Telephone Encounter (Signed)
Pt informed of recommendations.Tonya [name]  

## 2016-10-16 NOTE — Telephone Encounter (Signed)
Pt called and stated that she was reading the drug facts and it mentioned something about asking the Dr before using if you have cataracts or glaucoma. She states that she has a lens in her L eye from previous cataract surgery and her R eye she has a cataract. She wanted to know if this is safe/ok for her to take.   Will fwd to pcp for advice/recommendations.[name] [place]

## 2016-10-29 ENCOUNTER — Ambulatory Visit: Payer: Medicare HMO

## 2016-10-31 ENCOUNTER — Encounter (HOSPITAL_BASED_OUTPATIENT_CLINIC_OR_DEPARTMENT_OTHER): Payer: Self-pay | Admitting: Emergency Medicine

## 2016-10-31 ENCOUNTER — Emergency Department (HOSPITAL_BASED_OUTPATIENT_CLINIC_OR_DEPARTMENT_OTHER)
Admission: EM | Admit: 2016-10-31 | Discharge: 2016-10-31 | Disposition: A | Discharge status: Home / Self Care | Payer: Medicare HMO | Attending: Emergency Medicine | Admitting: Emergency Medicine

## 2016-10-31 DIAGNOSIS — Z79899 Other long term (current) drug therapy: Secondary | ICD-10-CM | POA: Insufficient documentation

## 2016-10-31 DIAGNOSIS — I10 Essential (primary) hypertension: Secondary | ICD-10-CM | POA: Diagnosis not present

## 2016-10-31 DIAGNOSIS — F419 Anxiety disorder, unspecified: Secondary | ICD-10-CM

## 2016-10-31 LAB — BASIC METABOLIC PANEL
ANION GAP: 9 (ref 5–15)
BUN: 12 mg/dL (ref 6–20)
CO2: 27 mmol/L (ref 22–32)
Calcium: 8.7 mg/dL — ABNORMAL LOW (ref 8.9–10.3)
Chloride: 103 mmol/L (ref 101–111)
Creatinine, Ser: 0.91 mg/dL (ref 0.44–1.00)
GFR, EST NON AFRICAN AMERICAN: 55 mL/min — AB (ref >60)
Glucose, Bld: 107 mg/dL — ABNORMAL HIGH (ref 65–99)
POTASSIUM: 3.7 mmol/L (ref 3.5–5.1)
SODIUM: 139 mmol/L (ref 135–145)

## 2016-10-31 MED ORDER — HYDROCHLOROTHIAZIDE 25 MG PO TABS
25.0000 mg | ORAL_TABLET | Freq: Once | ORAL | Status: AC
  Administered 2016-10-31: 25 mg via ORAL
  Filled 2016-10-31: qty 1

## 2016-10-31 MED ORDER — HYDROCHLOROTHIAZIDE 12.5 MG PO CAPS: 12.5000 mg | ORAL_CAPSULE | Freq: Every day | ORAL | 0 refills | Status: DC

## 2016-10-31 MED FILL — HYDROCHLOROTHIAZIDE 12.5 MG: 12.5 | 30 days supply | Qty: 30 | Fill #0

## 2016-10-31 NOTE — ED Triage Notes (Signed)
Pt states she felt like her bp was too high.  Feet appeared blue at home, had been sitting at home.  Pt using at home bp machine.  Hr 101, bp 170/110.   Both feet appear pink.  Good pedal pulses.

## 2016-10-31 NOTE — Discharge Instructions (Signed)
Stop amlodipine. Daily hydrochlorothiazide. Continue Cozaar.

## 2016-10-31 NOTE — ED Provider Notes (Signed)
[facility] Note   CSN: [id number] Arrival date & time: 10/31/16  1346     History   Chief Complaint Chief Complaint  Patient presents with  . Hypertension    HPI Tonya [name] is a 83 y.o. female. Chief complaint is high blood pressure and anxiety.  HPI: This is an 83 year old female with a history of high blood pressure. Has been on losartan for several years. Blood pressure has been higher recently. Her primary care physician started her on amlodipine O week ago. Patient states is been causing some swelling in her feet.  She states that she was sitting today. She looked down she felt her feet looked blue. She then reiterates and states that she feels they looked swollen. She states she also noted that the veins were prominent. She got up and walked around and it seemed to go away. She has not had pain or claudication symptoms. No rest pain or exertional pain. No CHF symptoms with cough or orthopnea. As we review her note she lists amlodipine as an allergy in 2013 secondary to "feet swelling". She did not recall this immediately but does also remember that she had to discontinue amlodipine before because of feet swelling.  Patient states that she called her neighbor who brought her blood pressure cuff over because the patient's "wouldn't register". Patient felt as though this med was "way too high". The neighbors reading was 177 over 100"something".  Past Medical History:  Diagnosis Date  . Anxiety   . Cystocele   . Degenerative disc disease   . Hypertension   . IBS (irritable bowel syndrome)   . Rectocele     Patient Active Problem List   Diagnosis Date Noted  . Gastroesophageal reflux disease with esophagitis 05/01/2016  . Medial meniscus tear 09/21/2015  . Hernia, hiatal 06/17/2015  . SCIATICA 05/04/2010  . ALLERGIC RHINITIS CAUSE UNSPECIFIED 10/01/2008  . IBS 06/30/2008  . CYST AND PSEUDOCYST OF PANCREAS 05/31/2008  . SHOULDER PAIN, RIGHT  01/14/2007  . Osteoporosis 01/14/2007  . POSITIONAL VERTIGO 09/04/2006  . GERD 07/12/2006  . ANXIETY DISORDER, GENERALIZED 05/07/2006  . HYPERTENSION, BENIGN 05/07/2006    Past Surgical History:  Procedure Laterality Date  . ABDOMINAL HYSTERECTOMY     partial  . CHOLECYSTECTOMY      OB History    No data available       Home Medications    Prior to Admission medications   Medication Sig Start Date End Date Taking? Authorizing Provider  ALPRAZolam (XANAX) 0.25 MG tablet TAKE 1 TABLET BY MOUTH TWICE DAILY AS NEEDED FOR ANXIETY OR SLEEP 10/12/16   Agapito Games, MD  amLODipine (NORVASC) 2.5 MG tablet Take 1 tablet (2.5 mg total) by mouth daily. 10/15/16   Agapito Games, MD  cetirizine (ZYRTEC) 10 MG tablet Take 10 mg by mouth daily.    [provider]  escitalopram (LEXAPRO) 10 MG tablet Take 1 tablet (10 mg total) by mouth daily. 09/24/16   Agapito Games, MD  esomeprazole (NEXIUM) 40 MG capsule Take 1 capsule (40 mg total) by mouth daily at 12 noon. 09/24/16   Agapito Games, MD  losartan (COZAAR) 100 MG tablet Take 1 tablet (100 mg total) by mouth daily. 09/24/16   Agapito Games, MD    Family History Family History  Problem Relation Age of Onset  . Depression Unknown   . GER disease Son   . Depression Son     Social History Social  History  Substance Use Topics  . Smoking status: Never Smoker  . Smokeless tobacco: Never Used  . Alcohol use No     Allergies   Amlodipine; Codeine; and Tramadol   Review of Systems Review of Systems  Constitutional: Negative for appetite change, chills, diaphoresis, fatigue and fever.  HENT: Negative for mouth sores, sore throat and trouble swallowing.   Eyes: Negative for visual disturbance.  Respiratory: Negative for cough, chest tightness, shortness of breath and wheezing.   Cardiovascular: Positive for leg swelling. Negative for chest pain.  Gastrointestinal: Negative for abdominal  distention, abdominal pain, diarrhea, nausea and vomiting.  Endocrine: Negative for polydipsia, polyphagia and polyuria.  Genitourinary: Negative for dysuria, frequency and hematuria.  Musculoskeletal: Negative for gait problem.  Skin: Negative for color change, pallor and rash.  Neurological: Negative for dizziness, syncope, light-headedness and headaches.  Hematological: Does not bruise/bleed easily.  Psychiatric/Behavioral: Negative for behavioral problems and confusion.     Physical Exam Updated Vital Signs BP (!) 193/94 (BP Location: Left Arm)   Pulse 80   Temp 98.6 F (37 C) (Oral)   Resp 20   SpO2 97%   Physical Exam  Constitutional: She is oriented to person, place, and time. She appears well-developed and well-nourished. No distress.  HENT:  Head: Normocephalic.  Eyes: Conjunctivae are normal. Pupils are equal, round, and reactive to light. No scleral icterus.  Neck: Normal range of motion. Neck supple. No thyromegaly present.  Cardiovascular: Normal rate and regular rhythm.  Exam reveals no gallop and no friction rub.   No murmur heard. Pulmonary/Chest: Effort normal and breath sounds normal. No respiratory distress. She has no wheezes. She has no rales.  Abdominal: Soft. Bowel sounds are normal. She exhibits no distension. There is no tenderness. There is no rebound.  Musculoskeletal: Normal range of motion.  Neurological: She is alert and oriented to person, place, and time.  Skin: Skin is warm and dry. No rash noted.  Feet appear normal. No bluish discoloration. Immediate capillary refill. DP and PT pulses palpable. Normal neurological exam. No ulcerations. No edema.  Psychiatric: She has a normal mood and affect. Her behavior is normal.     ED Treatments / Results  Labs (all labs ordered are listed, but only abnormal results are displayed) Labs Reviewed  BASIC METABOLIC PANEL - Abnormal; Notable for the following:       Result Value   Glucose, Bld 107 (*)     Calcium 8.7 (*)    GFR calc non Af Amer 55 (*)    All other components within normal limits    EKG  EKG Interpretation None       Radiology No results found.  Procedures Procedures (including critical care time)  Medications Ordered in ED Medications  hydrochlorothiazide (HYDRODIURIL) tablet 25 mg (25 mg Oral Given 10/31/16 1418)     Initial Impression / Assessment and Plan / ED Course  I have reviewed the triage vital signs and the nursing notes.  Pertinent labs & imaging results that were available during my care of the patient were reviewed by me and considered in my medical decision making (see chart for details).     EKG without changes. No voltage criteria for LVH. Clinically no signs of CHF. Blood pressure is improving. She took one of her 025 Xanax here and her symptoms are improving her anxiety is better. I think she is appropriate for discharge will discontinue her amlodipine in favor of hydrochlorothiazide daily. Primary care follow-up.  Final  Clinical Impressions(s) / ED Diagnoses   Final diagnoses:  Hypertension, unspecified type    New Prescriptions New Prescriptions   No medications on file     Rolland Porter, MD 10/31/16 1442

## 2016-10-31 NOTE — ED Notes (Addendum)
Pt states she was put on amlodipine approximately one week ago.  According to her chart, she was on amlodipine in 2013 and caused ankle swelling.

## 2016-11-05 ENCOUNTER — Ambulatory Visit (INDEPENDENT_AMBULATORY_CARE_PROVIDER_SITE_OTHER): Payer: Medicare HMO | Admitting: Family Medicine

## 2016-11-05 ENCOUNTER — Telehealth: Payer: Self-pay | Admitting: *Deleted

## 2016-11-05 ENCOUNTER — Encounter: Payer: Self-pay | Admitting: Family Medicine

## 2016-11-05 VITALS — BP 138/65 | HR 70 | Ht 67.0 in | Wt 169.0 lb

## 2016-11-05 DIAGNOSIS — I10 Essential (primary) hypertension: Secondary | ICD-10-CM

## 2016-11-05 MED ORDER — LOSARTAN POTASSIUM 100 MG PO TABS: 100.0000 mg | ORAL_TABLET | Freq: Every day | ORAL | 1 refills | Status: DC

## 2016-11-05 NOTE — Telephone Encounter (Signed)
Called pt and lvm informing her that she is to continue taking the Losartan 100 mg. If she feels that the 100 mg is to much she can cut this in half. Advised to call back if any questions.Loralee PacasBarkley, Kaiea Esselman [place]verseLynetta

## 2016-11-05 NOTE — Progress Notes (Signed)
Subjective:    Patient ID: Tonya [name], female    DOB: [birth year], 83 y.o.   MRN: [id number]  HPI 83 year old female who was recently seen at the hospital for elevated blood pressures and lower ext swelling. They decided to stop her amlodipine and switch her to chlorothiazide. She is actually feeling much better. She says her lower extremity swelling has completely resolved. She says her weight has actually gone down as well. She does not feel overly dry. She's been tolerating it well without any side effects or problems.  He also feels like her cough is much better. We discussed this when I saw her about 3 weeks ago I felt like it was likely related to the pollen allergies. I have recommended nasal spray. She says the cough overall seems to be much better.  Review of Systems  BP 138/65   Pulse 70   Ht 5\' 7"  (1.702 m)   Wt 169 lb (76.7 kg)   SpO2 95%   BMI 26.47 kg/m     Allergies  Allergen Reactions  . Amlodipine Other (See Comments)    Ankle swelling.   . Codeine     Other reaction(s): Unknown  . Tramadol Nausea Only    Past Medical History:  Diagnosis Date  . Anxiety   . Cystocele   . Degenerative disc disease   . Hypertension   . IBS (irritable bowel syndrome)   . Rectocele     Past Surgical History:  Procedure Laterality Date  . ABDOMINAL HYSTERECTOMY     partial  . CHOLECYSTECTOMY      Social History   Social History  . Marital status: Widowed    Spouse name: N/A  . Number of children: N/A  . Years of education: N/A   Occupational History  . Not on file.   Social History Main Topics  . Smoking status: Never Smoker  . Smokeless tobacco: Never Used  . Alcohol use No  . Drug use: No  . 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   Other Topics Concern  . Not on file   Social History Narrative  . No narrative on file    Family History   Problem Relation Age of Onset  . Depression Unknown   . GER disease Son   . Depression Son     Outpatient Encounter Prescriptions as of 11/05/2016  Medication Sig  . ALPRAZolam (XANAX) 0.25 MG tablet TAKE 1 TABLET BY MOUTH TWICE DAILY AS NEEDED FOR ANXIETY OR SLEEP  . cetirizine (ZYRTEC) 10 MG tablet Take 10 mg by mouth daily.  Marland Kitchen. escitalopram (LEXAPRO) 10 MG tablet Take 1 tablet (10 mg total) by mouth daily.  Marland Kitchen. esomeprazole (NEXIUM) 40 MG capsule Take 1 capsule (40 mg total) by mouth daily at 12 noon.  . hydrochlorothiazide (MICROZIDE) 12.5 MG capsule Take 1 capsule (12.5 mg total) by mouth daily.  Marland Kitchen. losartan (COZAAR) 100 MG tablet Take 1 tablet (100 mg total) by mouth daily.  . [DISCONTINUED] amLODipine (NORVASC) 2.5 MG tablet Take 1 tablet (2.5 mg total) by mouth daily.  . [DISCONTINUED] losartan (COZAAR) 100 MG tablet Take 1 tablet (100 mg total) by mouth daily.   No facility-administered encounter medications on file as of 11/05/2016.           Objective:   Physical Exam  Constitutional: She is oriented to person, place, and time. She appears  well-developed and well-nourished.  HENT:  Head: Normocephalic and atraumatic.  Cardiovascular: Normal rate, regular rhythm and normal heart sounds.   Pulmonary/Chest: Effort normal and breath sounds normal.  Neurological: She is alert and oriented to person, place, and time.  Skin: Skin is warm and dry.  Psychiatric: She has a normal mood and affect. Her behavior is normal.        Assessment & Plan:  HTN - Well controlled.Will check a BMP today she's been on the diuretic for about 5 days at this point. Spine we just have to monitor her kidneys and potassium and as long as that looks great and will continue with her current regimen. Continue current regimen. Follow up in  6 months.  Discussed only checking her blood pressure twice a week. It looks fantastic today.

## 2016-11-08 ENCOUNTER — Other Ambulatory Visit: Payer: Self-pay | Admitting: Family Medicine

## 2016-12-03 ENCOUNTER — Telehealth: Payer: Self-pay | Admitting: *Deleted

## 2016-12-03 ENCOUNTER — Telehealth: Payer: Self-pay | Admitting: Family Medicine

## 2016-12-03 ENCOUNTER — Other Ambulatory Visit: Payer: Self-pay | Admitting: Physician Assistant

## 2016-12-03 DIAGNOSIS — Z79899 Other long term (current) drug therapy: Secondary | ICD-10-CM

## 2016-12-03 MED ORDER — HYDROCHLOROTHIAZIDE 12.5 MG PO CAPS: 12.5000 mg | ORAL_CAPSULE | Freq: Every day | ORAL | 0 refills | Status: DC

## 2016-12-03 NOTE — Telephone Encounter (Signed)
Left message on vm as patient requested earlier( she was going to the grocery store and asked that I leave a detailed message on her voicemail.)

## 2016-12-03 NOTE — Telephone Encounter (Signed)
Patient called request to get a call back about a medication that she is taking. Would like to speak with nurse adv Dr. Linford ArnoldMetheney is on vacation this week.

## 2016-12-03 NOTE — Telephone Encounter (Signed)
Called pt and informed her that according to Dr. Shelah LewandowskyMetheney's notes she was to have labs done in order to know whether or not she was going to continue to keep her on the HCTZ.   Pt stated that she would come in today to have this done.Laureen Ochs.Ashawna Hanback, Viann Shoveonya [name]

## 2016-12-03 NOTE — Progress Notes (Signed)
Gave one more month of HCTZ. With intentions of having recheck of BMP. Will fax lab slip down to lab. Needs to have drawn before future refills. Tandy GawJade Tahjai Schetter PA-C

## 2016-12-03 NOTE — Telephone Encounter (Signed)
I sent her one month of HCTz. Dr. Linford ArnoldMetheney wanted her to have bmp to recheck kidney/electrolytes and then she was ok with refilling. I reprinted labs to have drawn. Needs lab before next refill.

## 2016-12-03 NOTE — Telephone Encounter (Signed)
Patient called and states another provider, Jacklynn BueJames Marks,MD had prescribed hydrochlorothiazide 12.5mg  and the patient has been taking this daily. The patient states the medication was prescribed in the hospital. She states she has not had any "swelling since being on the medication." She also states he blood pressure reading have been in the 130's/80's. The patient took her last pill today. She wants to know if she needs to stay on this medication. She will need a new prescription sent to the pharmacy.Please advise

## 2016-12-05 LAB — BASIC METABOLIC PANEL WITH GFR
BUN: 17 mg/dL (ref 7–25)
CALCIUM: 8.8 mg/dL (ref 8.6–10.4)
CO2: 26 mmol/L (ref 20–31)
CREATININE: 1.01 mg/dL — AB (ref 0.60–0.88)
Chloride: 103 mmol/L (ref 98–110)
GFR, Est African American: 58 mL/min — ABNORMAL LOW (ref >=60)
GFR, Est Non African American: 50 mL/min — ABNORMAL LOW (ref >=60)
Glucose, Bld: 75 mg/dL (ref 65–99)
Potassium: 4.1 mmol/L (ref 3.5–5.3)
SODIUM: 139 mmol/L (ref 135–146)

## 2016-12-11 ENCOUNTER — Encounter: Payer: Self-pay | Admitting: Family Medicine

## 2016-12-11 ENCOUNTER — Ambulatory Visit (INDEPENDENT_AMBULATORY_CARE_PROVIDER_SITE_OTHER): Payer: Medicare HMO | Admitting: Family Medicine

## 2016-12-11 VITALS — BP 137/77 | HR 66 | Ht 67.0 in | Wt 170.0 lb

## 2016-12-11 DIAGNOSIS — I10 Essential (primary) hypertension: Secondary | ICD-10-CM

## 2016-12-11 DIAGNOSIS — F411 Generalized anxiety disorder: Secondary | ICD-10-CM | POA: Diagnosis not present

## 2016-12-11 MED ORDER — ALPRAZOLAM 0.25 MG PO TABS: 0.2500 mg | ORAL_TABLET | Freq: Two times a day (BID) | ORAL | 2 refills | Status: DC | PRN

## 2016-12-11 NOTE — Progress Notes (Signed)
Subjective:    CC: HTN  HPI:  Hypertension- Pt denies chest pain, SOB, dizziness, or heart palpitations.  Taking meds as directed w/o problems.  Denies medication side effects.  She actually has been off the HCTZ for about a week. She was afraid to get it refilled without hearing from us. She say sher ankle swelling is much better.    Anxiety-overall doing well. She really does feel like the Lexapro has made a big difference. She is requesting a refill on her alprazolam.  Patient's daughter was here today for office visit.  Past medical history, Surgical history, Family history not pertinant except as noted below, Social history, Allergies, and medications have been entered into the medical record, reviewed, and corrections made.   Review of Systems: No fevers, chills, night sweats, weight loss, chest pain, or shortness of breath.   Objective:    General: Well Developed, well nourished, and in no acute distress.  Neuro: Alert and oriented x3, extra-ocular muscles intact, sensation grossly intact.  HEENT: Normocephalic, atraumatic  Skin: Warm and dry, no rashes. Cardiac: Regular rate and rhythm, no murmurs rubs or gallops, no lower extremity edema. ankles look great today.   Respiratory: Clear to auscultation bilaterally. Not using accessory muscles, speaking in full sentences.   Impression and Recommendations:   Hypertension-continue current regimen. We'll continue with losartan hold HCTZ since blood pressure still looks great today. She can use it as needed for ankle swelling. If she is using it frequently then we can look at whether or not we need to go back to taking it daily. Continue to monitor home blood pressures.  Anxiety-overall doing well. Continue Lexapro. Reminded her again to continue to use the alprazolam very sparingly. I really would prefer that 60 tabs last her a couple of months instead of 30 days. Refills provided today.

## 2016-12-17 ENCOUNTER — Other Ambulatory Visit: Payer: Self-pay | Admitting: Family Medicine

## 2016-12-17 ENCOUNTER — Ambulatory Visit: Payer: Medicare HMO | Admitting: Family Medicine

## 2016-12-25 ENCOUNTER — Ambulatory Visit: Payer: Medicare HMO | Admitting: Podiatry

## 2016-12-31 ENCOUNTER — Other Ambulatory Visit: Payer: Self-pay | Admitting: Family Medicine

## 2017-02-01 ENCOUNTER — Other Ambulatory Visit: Payer: Self-pay

## 2017-02-01 MED ORDER — HYDROCHLOROTHIAZIDE 12.5 MG PO CAPS: 12.5000 mg | ORAL_CAPSULE | Freq: Every day | ORAL | 1 refills | Status: DC

## 2017-03-11 ENCOUNTER — Other Ambulatory Visit: Payer: Self-pay | Admitting: Family Medicine

## 2017-04-09 ENCOUNTER — Telehealth: Payer: Self-pay | Admitting: Family Medicine

## 2017-04-09 ENCOUNTER — Ambulatory Visit (INDEPENDENT_AMBULATORY_CARE_PROVIDER_SITE_OTHER): Payer: Medicare HMO | Admitting: Family Medicine

## 2017-04-09 ENCOUNTER — Encounter: Payer: Self-pay | Admitting: Family Medicine

## 2017-04-09 VITALS — BP 138/69 | HR 63 | Ht 67.0 in | Wt 166.0 lb

## 2017-04-09 DIAGNOSIS — I10 Essential (primary) hypertension: Secondary | ICD-10-CM | POA: Diagnosis not present

## 2017-04-09 DIAGNOSIS — M7742 Metatarsalgia, left foot: Secondary | ICD-10-CM

## 2017-04-09 DIAGNOSIS — M7741 Metatarsalgia, right foot: Secondary | ICD-10-CM

## 2017-04-09 DIAGNOSIS — R05 Cough: Secondary | ICD-10-CM

## 2017-04-09 DIAGNOSIS — R053 Chronic cough: Secondary | ICD-10-CM

## 2017-04-09 DIAGNOSIS — F411 Generalized anxiety disorder: Secondary | ICD-10-CM

## 2017-04-09 NOTE — Progress Notes (Signed)
Subjective:    CC: BP  HPI:  Hypertension- Pt denies chest pain, SOB, dizziness, or heart palpitations.  Taking meds as directed w/o problems.  Denies medication side effects.  She is just taking hydrochlorothiazide daily.  Follow-up generalized anxiety-currently on Lexapro and uses alprazolam as needed.  Overall she is doing really well with her anxiety.  Her daughter who is here with her says that she has had much fewer panic attacks.  She also complains of bilateral foot pain-this is mostly over the bottom pad of her foot.  She does see a podiatrist.  She says it is worse the more she is up on her feet.  And then occasionally she will actually get burning in her right heel mostly at night that bothers her.  She normally wears good supportive shoe wear.  Her feet feel very cold at times as well.  Complains that she still has a persistent cough.  Again it is mostly in the mornings.  She has a known history of allergic rhinitis and says she wakes up feeling congested having to blow her nose and starting to cough.  She also has a history of reflux but takes her Nexium daily.  Past medical history, Surgical history, Family history not pertinant except as noted below, Social history, Allergies, and medications have been entered into the medical record, reviewed, and corrections made.   Review of Systems: No fevers, chills, night sweats, weight loss, chest pain, or shortness of breath.   Objective:    General: Well Developed, well nourished, and in no acute distress.  Neuro: Alert and oriented x3, extra-ocular muscles intact, sensation grossly intact.  HEENT: Normocephalic, atraumatic  Skin: Warm and dry, no rashes. Cardiac: Regular rate and rhythm, no murmurs rubs or gallops, no lower extremity edema.  Respiratory: Clear to auscultation bilaterally. Not using accessory muscles, speaking in full sentences. MSK: Left foot with a couple of corns on the pad of the foot with a little bit of  thickening of the skin.  Nontender on exam.  Feet appear to have good circulation and color.  No significant deformity of the toes.  She actually has fairly high proximal arches but her distal arch is somewhat flattened.   Impression and Recommendations:    HTN -overall well controlled today.  We will continue to monitor carefully.  Continue with HCTZ which seems to be working well.  I did go ahead and remove the losartan from her medication list and she is not actually taking it.  If we do need to restart it we can always start with a low dose such as 25 mg daily.  GAD-stable.  Continue with Lexapro.  Doing well on her alprazolam.  Again encouraging her to use it sparingly.  Metatarsalgia -amended metatarsal pads.  Gave her a pair to put in her shoes.  Encouraged her to wear those for about 2 or 3 weeks.  If not improving then she may want to speak with her podiatrist.  She also had some corns on the pad of her foot on her left foot which certainly could be contributing to some of her pain but she says that her podiatrist regularly follows those down.  Chronic morning cough-likely related to her allergic rhinitis congestion and excess drainage.  Etc. chest x-ray for further evaluation.  No sputum production.

## 2017-04-09 NOTE — Telephone Encounter (Signed)
Call patient: I did put in an order for a chest x-ray.  I looked back in the last one we did was about 2 years ago just to make sure that everything looks okay with her chronic cough that she is still having.  She can go at her convenience.

## 2017-04-09 NOTE — Telephone Encounter (Signed)
Pt advised.

## 2017-04-14 ENCOUNTER — Other Ambulatory Visit: Payer: Self-pay | Admitting: Family Medicine

## 2017-04-15 ENCOUNTER — Ambulatory Visit (INDEPENDENT_AMBULATORY_CARE_PROVIDER_SITE_OTHER): Payer: Medicare HMO

## 2017-04-15 DIAGNOSIS — J9811 Atelectasis: Secondary | ICD-10-CM | POA: Diagnosis not present

## 2017-04-15 DIAGNOSIS — R053 Chronic cough: Secondary | ICD-10-CM

## 2017-04-15 DIAGNOSIS — R05 Cough: Secondary | ICD-10-CM

## 2017-04-16 ENCOUNTER — Telehealth: Payer: Self-pay | Admitting: Family Medicine

## 2017-04-16 MED ORDER — LEVOFLOXACIN 500 MG PO TABS: 500.0000 mg | ORAL_TABLET | Freq: Every day | ORAL | 0 refills | Status: DC

## 2017-04-16 MED ORDER — AZITHROMYCIN 250 MG PO TABS: ORAL_TABLET | ORAL | 0 refills | Status: AC

## 2017-04-16 NOTE — Telephone Encounter (Signed)
OK, new rx sent to the pharmacy

## 2017-04-16 NOTE — Telephone Encounter (Signed)
Pt's daughter called to see if PCP would send over a different antibiotic. States the warning label on the levaquin is very long. Pt's son is a retired Teacher, early years/prepharmacist and he requested a different Rx be sent in as well to try first.  Will route.

## 2017-04-16 NOTE — Progress Notes (Unsigned)
Antibiotic sent to pharmacy.  

## 2017-04-17 NOTE — Telephone Encounter (Signed)
Pt's daughter advised.  

## 2017-04-23 ENCOUNTER — Telehealth: Payer: Self-pay | Admitting: Family Medicine

## 2017-04-23 NOTE — Telephone Encounter (Signed)
Awesome! Thank you!

## 2017-04-23 NOTE — Telephone Encounter (Signed)
Pt wanted to let Dr.Metheney know that she is feeling somewhat better after the z- pack

## 2017-05-15 ENCOUNTER — Other Ambulatory Visit: Payer: Self-pay | Admitting: Family Medicine

## 2017-05-15 DIAGNOSIS — M1712 Unilateral primary osteoarthritis, left knee: Secondary | ICD-10-CM | POA: Diagnosis not present

## 2017-05-22 ENCOUNTER — Ambulatory Visit (INDEPENDENT_AMBULATORY_CARE_PROVIDER_SITE_OTHER): Payer: Medicare HMO | Admitting: Family Medicine

## 2017-05-22 ENCOUNTER — Ambulatory Visit (INDEPENDENT_AMBULATORY_CARE_PROVIDER_SITE_OTHER): Payer: Medicare HMO

## 2017-05-22 ENCOUNTER — Encounter: Payer: Self-pay | Admitting: Family Medicine

## 2017-05-22 VITALS — BP 132/68 | HR 69 | Ht 67.0 in | Wt 163.0 lb

## 2017-05-22 DIAGNOSIS — R0602 Shortness of breath: Secondary | ICD-10-CM | POA: Diagnosis not present

## 2017-05-22 DIAGNOSIS — H6123 Impacted cerumen, bilateral: Secondary | ICD-10-CM | POA: Diagnosis not present

## 2017-05-22 DIAGNOSIS — R05 Cough: Secondary | ICD-10-CM

## 2017-05-22 DIAGNOSIS — R0789 Other chest pain: Secondary | ICD-10-CM

## 2017-05-22 DIAGNOSIS — K449 Diaphragmatic hernia without obstruction or gangrene: Secondary | ICD-10-CM

## 2017-05-22 DIAGNOSIS — R059 Cough, unspecified: Secondary | ICD-10-CM

## 2017-05-22 DIAGNOSIS — R918 Other nonspecific abnormal finding of lung field: Secondary | ICD-10-CM | POA: Diagnosis not present

## 2017-05-22 NOTE — Progress Notes (Signed)
Subjective:    Patient ID: Tonya RhodesVirginia A [name], female    DOB: June 25, 1926, 83 y.o.   MRN: [id number]  HPI 83 yo female with HTN comes in c/o of mild intermittant cough and chest soreness for approx 1 months.  No pain with deep breath.  Says noticed a wheezing around Thanksgiving. She is taking Zyrtec.  Mucous is clear.  She actually complained of a chronic cough I saw her October 23.  At that time we decided to go ahead and get a chest x-ray which showed some possible infiltrates and so I did put her on azithromycin at that time and she said it did seem to help.  But she is still has had this intermittent cough since then and says her chest will feel sore after coughing.  It would usually last for couple hours she takes Advil and that does seem to provide some relief.  She says she is also noticing shortness of breath with activity such as going up stairs or walking to her mailbox.  She does not get pain at the same time though.  She denies any dizziness, jaw pain, arm or radiating pain.  She denies any extremity swelling.  She does occasionally see mucus is mostly clear.   Review of Systems  BP 132/68   Pulse 69   Ht 5\' 7"  (1.702 m)   Wt 163 lb (73.9 kg)   SpO2 97%   BMI 25.53 kg/m     Allergies  Allergen Reactions  . Amlodipine Other (See Comments)    Ankle swelling.   . Codeine     Other reaction(s): Unknown  . Tramadol Nausea Only    Past Medical History:  Diagnosis Date  . Anxiety   . Cystocele   . Degenerative disc disease   . Hypertension   . IBS (irritable bowel syndrome)   . Rectocele     Past Surgical History:  Procedure Laterality Date  . ABDOMINAL HYSTERECTOMY     partial  . CHOLECYSTECTOMY      Social History   Socioeconomic History  . Marital status: Widowed    Spouse name: Not on file  . Number of children: Not on file  . Years of education: Not on file  . Highest education level: Not on file  Social Needs  . Financial resource strain: Not on file   . Food insecurity - worry: Not on file  . Food insecurity - inability: Not on file  . Transportation needs - medical: Not on file  . Transportation needs - non-medical: Not on file  Occupational History  . Not on file  Tobacco Use  . Smoking status: Never Smoker  . Smokeless tobacco: Never Used  Substance and Sexual Activity  . Alcohol use: No  . Drug use: No  . 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  Other Topics Concern  . Not on file  Social History Narrative  . Not on file    Family History  Problem Relation Age of Onset  . Depression Unknown   . GER disease Son   . Depression Son     Outpatient Encounter Medications as of 05/22/2017  Medication Sig  . ALPRAZolam (XANAX) 0.25 MG tablet TAKE 1/2 TO 1 TABLET BY MOUTH TWICE DAILY AS NEEDED FOR ANXIETY  . escitalopram (LEXAPRO) 10 MG tablet TAKE 1 TABLET(10 MG) BY MOUTH DAILY  . esomeprazole (NEXIUM) 40 MG  capsule TAKE ONE CAPSULE BY MOUTH DAILY AT NOON  . hydrochlorothiazide (MICROZIDE) 12.5 MG capsule Take 1 capsule (12.5 mg total) by mouth daily.   No facility-administered encounter medications on file as of 05/22/2017.          Objective:   Physical Exam  Constitutional: She is oriented to person, place, and time. She appears well-developed and well-nourished.  HENT:  Head: Normocephalic and atraumatic.  Right Ear: External ear normal.  Left Ear: External ear normal.  Nose: Nose normal.  Mouth/Throat: Oropharynx is clear and moist.  Both canals blocked by cerumen.   Eyes: Conjunctivae and EOM are normal. Pupils are equal, round, and reactive to light.  Neck: Neck supple. No thyromegaly present.  Cardiovascular: Normal rate, regular rhythm and normal heart sounds.  Pulmonary/Chest: Effort normal and breath sounds normal. She has no wheezes.  Lymphadenopathy:    She has no cervical adenopathy.   Neurological: She is alert and oriented to person, place, and time.  Skin: Skin is warm and dry.  Psychiatric: She has a normal mood and affect.        Assessment & Plan:  Chronic cough-this is actually not new and has been going on for years but seems like it has been a little bit more frequent over the last month.  Plan to repeat chest x-ray today to see if the infiltrates have cleared from the chest x-ray performed in October.  Will call with results once available.  Still consider postnasal drip, reflux and allergies as a potential cause.  Chest soreness-I really think this is more related to the chest wall from coughing.  It does not sound cardiac in nature but we did go ahead and do an EKG just to reassure her.  EKG did show today shows rate of 59 bpm, normal sinus rhythm with no acute ST-T wave changes.  Lateral impacted cerumen-recommend Debrox drops at home.

## 2017-06-14 ENCOUNTER — Other Ambulatory Visit: Payer: Self-pay | Admitting: Family Medicine

## 2017-06-14 NOTE — Telephone Encounter (Signed)
Last refill was 05/15/17 #40 for a 30 day supply. You have been weaning her down by 5 tabs monthly since 03/11/17. Will fwd to pcp to sign and send to pharmacy.Loralee PacasBarkley, [name] [name]

## 2017-06-25 ENCOUNTER — Other Ambulatory Visit: Payer: Medicare HMO

## 2017-07-12 ENCOUNTER — Other Ambulatory Visit: Payer: Self-pay | Admitting: Family Medicine

## 2017-07-12 NOTE — Telephone Encounter (Signed)
Routed to pcp for signature.Aleera Gilcrease [name], CMA  

## 2017-07-26 ENCOUNTER — Ambulatory Visit (INDEPENDENT_AMBULATORY_CARE_PROVIDER_SITE_OTHER): Payer: Medicare HMO | Admitting: Family Medicine

## 2017-07-26 ENCOUNTER — Encounter: Payer: Self-pay | Admitting: Family Medicine

## 2017-07-26 VITALS — BP 150/77 | HR 69 | Wt 171.0 lb

## 2017-07-26 DIAGNOSIS — M7741 Metatarsalgia, right foot: Secondary | ICD-10-CM | POA: Diagnosis not present

## 2017-07-26 DIAGNOSIS — M2042 Other hammer toe(s) (acquired), left foot: Secondary | ICD-10-CM

## 2017-07-26 DIAGNOSIS — R609 Edema, unspecified: Secondary | ICD-10-CM

## 2017-07-26 DIAGNOSIS — M79672 Pain in left foot: Secondary | ICD-10-CM

## 2017-07-26 DIAGNOSIS — M2041 Other hammer toe(s) (acquired), right foot: Secondary | ICD-10-CM | POA: Insufficient documentation

## 2017-07-26 DIAGNOSIS — M79671 Pain in right foot: Secondary | ICD-10-CM | POA: Diagnosis not present

## 2017-07-26 DIAGNOSIS — M7742 Metatarsalgia, left foot: Secondary | ICD-10-CM | POA: Diagnosis not present

## 2017-07-26 MED ORDER — DICLOFENAC SODIUM 1 % TD GEL: 2.0000 g | Freq: Four times a day (QID) | TRANSDERMAL | 11 refills | Status: DC

## 2017-07-26 NOTE — Progress Notes (Signed)
Tonya [name] is a 84 y.o. female who presents to [facility] today for right foot pain and leg swelling.  Tonya has a history of right foot pain ongoing now for years.  She has several issues that she had identified in her foot that have been a problem.  She has DJD, hammertoes, and onychomycosis.  She has been seen by a podiatrist several times who was recommending surgery.  She did not like the idea of surgery and is here today to see what she can do for her foot pain.  She notes the pain is present daily and worse when she stands and walks.  The pain is interfering with her quality of life some.  Leg swelling: Tonya also notes bothersome leg swelling bilaterally.  She notes the area proximal to her ankles become swollen when she stands for prolonged period of time or by the end of the day.  She takes hydrochlorothiazide for blood pressure and is a fluid pill which she notes is mildly helpful.  She does not currently use compression stockings.  She denies significant shortness of breath chest pain palpitations orthopnea.   Past Medical History:  Diagnosis Date  . Anxiety   . Cystocele   . Degenerative disc disease   . Hypertension   . IBS (irritable bowel syndrome)   . Rectocele    Past Surgical History:  Procedure Laterality Date  . ABDOMINAL HYSTERECTOMY     partial  . CHOLECYSTECTOMY     Social History   Tobacco Use  . Smoking status: Never Smoker  . Smokeless tobacco: Never Used  Substance Use Topics  . Alcohol use: No     ROS:  As above   Medications: Current Outpatient Medications  Medication Sig Dispense Refill  . ALPRAZolam (XANAX) 0.25 MG tablet Take 0.5-1 tablets (0.125-0.25 mg total) by mouth daily as needed. for anxiety. This is a 30 day supply 30 tablet 0  . escitalopram (LEXAPRO) 10 MG tablet TAKE 1 TABLET(10 MG) BY MOUTH DAILY 60 tablet 0  . esomeprazole (NEXIUM) 40 MG capsule TAKE ONE CAPSULE BY  MOUTH DAILY AT NOON 90 capsule 1  . hydrochlorothiazide (MICROZIDE) 12.5 MG capsule Take 1 capsule (12.5 mg total) by mouth daily. 90 capsule 1  . diclofenac sodium (VOLTAREN) 1 % GEL Apply 2 g topically 4 (four) times daily. To affected joint. 100 g 11   No current facility-administered medications for this visit.    Allergies  Allergen Reactions  . Amlodipine Other (See Comments)    Ankle swelling.   . Codeine     Other reaction(s): Unknown  . Tramadol Nausea Only     Exam:  BP (!) 150/77   Pulse 69   Wt 171 lb (77.6 kg)   BMI 26.78 kg/m  General: Well Developed, well nourished, and in no acute distress.  Neuro/Psych: Alert and oriented x3, extra-ocular muscles intact, able to move all 4 extremities, sensation grossly intact. Skin: Warm and dry, no rashes noted.  Respiratory: Not using accessory muscles, speaking in full sentences, trachea midline.  Cardiovascular: Pulses palpable, no extremity edema. Abdomen: Does not appear distended. MSK:  Feet bilaterally are significantly joint degenerative appearance with significant bossing at the tarsometatarsal joint at the first ray She has a concomitant bunion formation bilaterally as well right worse than left Additionally she has significant hammertoes and claw toes deformities bilaterally worse at the right second toe. Additionally she has thickened discolored nails at the great toes  bilaterally indicating onychomycosis She is tender to palpation at the plantar metatarsal heads bilaterally and especially tender to palpation at the second toe DIP which is mildly erythematous Pulses capillary refill and sensation are intact  Her lower extremities do not have significant edema.   No results found for this or any previous visit (from the past 48 hour(s)). No results found.    Assessment and Plan: 84 y.o. female with  Foot pain right worse than left related to DJD hammertoe deformity and metatarsalgia.  Tonya [name] has room for  improvement with conservative management.  I think first and foremost she could have better foot wear for her foot shape.  Her shoes are narrow with a small toe box which are exacerbating the pain related to her hammertoe deformity.  We discussed the importance of broad shoes with a large soft toe box.  Additionally I think that she can have considerable improvement with a hammertoe pad.  Lastly I think a trial of diclofenac gel makes a lot of sense for the pain that she is feeling at the second DIP joint as this should help with some of the pain and inflammation. She will return in about a month with her new shoes and her hammertoe pads.  At that time will set metatarsal pads which should help some of the metatarsalgia that she experiences.  If she fails to improve I think she can have further improvement with targeted injections of bothersome joints and potentially orthotics.  I think we should try lots of things before we reserve the surgery for her as she is somewhat of a poor surgical candidate.  The leg swelling that she experiencing I think is dependent edema and not related to any orthopedic issues.  She does not have a lot of edema now but what she describes sounds like edema towards the end of the day.  I discussed the importance of compression stockings and showed her how to apply compression stockings more easily.  Will recheck in a month.  If not well controlled we would perhaps consider Lasix after consultation with her primary care provider.    No orders of the defined types were placed in this encounter.  Meds ordered this encounter  Medications  . diclofenac sodium (VOLTAREN) 1 % GEL    Sig: Apply 2 g topically 4 (four) times daily. To affected joint.    Dispense:  100 g    Refill:  11    Discussed warning signs or symptoms. Please see discharge instructions. Patient expresses understanding.  I spent 25 minutes with this patient, greater than 50% was face-to-face time counseling  regarding ddx and what we discussed in the AP.

## 2017-07-26 NOTE — Patient Instructions (Addendum)
Thank you for coming in today. Use a compression stocking Use a hammer toe pad.  Get wider shoes with a taller toe box.  You can get custom shoes.  Recheck in 1 month.   Return with shoes.    Hammer Toe Hammer toe is a change in the shape (a deformity) of your second, third, or fourth toe. The deformity causes the middle joint of your toe to stay bent. This causes pain, especially when you are wearing shoes. Hammer toe starts gradually. At first, the toe can be straightened. Gradually over time, the deformity becomes stiff and permanent. Early treatments to keep the toe straight may relieve pain. As the deformity becomes stiff and permanent, surgery may be needed to straighten the toe. What are the causes? Hammer toe is caused by abnormal bending of the toe joint that is closest to your foot. It happens gradually over time. This pulls on the muscles and connections (tendons) of the toe joint, making them weak and stiff. It is often related to wearing shoes that are too short or narrow and do not let your toes straighten. What increases the risk? You may be at greater risk for hammer toe if you:  Are female.  Are older.  Wear shoes that are too small.  Wear high-heeled shoes that pinch your toes.  Are a Advertising account plannerballet dancer.  Have a second toe that is longer than your big toe (first toe).  Injure your foot or toe.  Have arthritis.  Have a family history of hammer toe.  Have a nerve or muscle disorder.  What are the signs or symptoms? The main symptoms of this condition are pain and deformity of the toe. The pain is worse when wearing shoes, walking, or running. Other symptoms may include:  Corns or calluses over the bent part of the toe or between the toes.  Redness and a burning feeling on the toe.  An open sore that forms on the top of the toe.  Not being able to straighten the toe.  How is this diagnosed? This condition is diagnosed based on your symptoms and a physical  exam. During the exam, your health care provider will try to straighten your toe to see how stiff the deformity is. You may also have tests, such as:  A blood test to check for rheumatoid arthritis.  An X-ray to show how severe the deformity is.  How is this treated? Treatment for this condition will depend on how stiff the deformity is. Surgery is often needed. However, sometimes a hammer toe can be straightened without surgery. Treatments that do not involve surgery include:  Taping the toe into a straightened position.  Using pads and cushions to protect the toe (orthotics).  Wearing shoes that provide enough room for the toes.  Doing toe-stretching exercises at home.  Taking an NSAID to reduce pain and swelling.  If these treatments do not help or the toe cannot be straightened, surgery is the next option. The most common surgeries used to straighten a hammer toe include:  Arthroplasty. In this procedure, part of the joint is removed, and that allows the toe to straighten.  Fusion. In this procedure, cartilage between the two bones of the joint is taken out and the bones are fused together into one longer bone.  Implantation. In this procedure, part of the bone is removed and replaced with an implant to let the toe move again.  Flexor tendon transfer. In this procedure, the tendons that curl the  toes down (flexor tendons) are repositioned.  Follow these instructions at home:  Take over-the-counter and prescription medicines only as told by your health care provider.  Do toe straightening and stretching exercises as told by your health care provider.  Keep all follow-up visits as told by your health care provider. This is important. How is this prevented?  Wear shoes that give your toes enough room and do not cause pain.  Do not wear high-heeled shoes. Contact a health care provider if:  Your pain gets worse.  Your toe becomes red or swollen.  You develop an open  sore on your toe. This information is not intended to replace advice given to you by your health care provider. Make sure you discuss any questions you have with your health care provider. Document Released: 06/01/2000 Document Revised: 12/23/2015 Document Reviewed: 09/28/2015 Elsevier Interactive Patient Education  [employer].

## 2017-08-15 ENCOUNTER — Other Ambulatory Visit: Payer: Self-pay | Admitting: Family Medicine

## 2017-08-15 DIAGNOSIS — F411 Generalized anxiety disorder: Secondary | ICD-10-CM

## 2017-08-15 DIAGNOSIS — I10 Essential (primary) hypertension: Secondary | ICD-10-CM

## 2017-08-15 NOTE — Telephone Encounter (Signed)
Pt looked up in [place] database she last refilled medication on 07/12/17 #30. Will fwd to pcp for review and signature.Heath GoldBarkley, Zaylynn Rickett [name], CMA

## 2017-08-20 ENCOUNTER — Encounter: Payer: Self-pay | Admitting: Family Medicine

## 2017-08-20 ENCOUNTER — Ambulatory Visit (INDEPENDENT_AMBULATORY_CARE_PROVIDER_SITE_OTHER): Payer: Medicare HMO | Admitting: Family Medicine

## 2017-08-20 VITALS — BP 137/61 | HR 74 | Ht 67.0 in | Wt 165.0 lb

## 2017-08-20 DIAGNOSIS — E7800 Pure hypercholesterolemia, unspecified: Secondary | ICD-10-CM

## 2017-08-20 DIAGNOSIS — I10 Essential (primary) hypertension: Secondary | ICD-10-CM | POA: Diagnosis not present

## 2017-08-20 DIAGNOSIS — E559 Vitamin D deficiency, unspecified: Secondary | ICD-10-CM | POA: Diagnosis not present

## 2017-08-20 DIAGNOSIS — K580 Irritable bowel syndrome with diarrhea: Secondary | ICD-10-CM | POA: Diagnosis not present

## 2017-08-20 DIAGNOSIS — F411 Generalized anxiety disorder: Secondary | ICD-10-CM | POA: Diagnosis not present

## 2017-08-20 DIAGNOSIS — I878 Other specified disorders of veins: Secondary | ICD-10-CM | POA: Diagnosis not present

## 2017-08-20 DIAGNOSIS — Z23 Encounter for immunization: Secondary | ICD-10-CM | POA: Diagnosis not present

## 2017-08-20 NOTE — Progress Notes (Signed)
Subjective:    CC: Anxiety, and BP  HPI:  Hypertension- Pt denies chest pain, SOB, dizziness, or heart palpitations.  Taking meds as directed w/o problems.  Denies medication side effects.    F/U GAD - she feels like she is doing well on the Lexapro.   F/U IBS -she is doing well.  No recent flares or exacerbations.  Still has a little bit of chronic cough but says overall is better.  She takes a half of Zyrtec daily and that seems to make a difference.  She was originally scheduled to follow-up for spirometry but had to cancel the appointment.  He also saw Dr. Denyse Amassorey recently for hammertoe and chronic venous stasis and swelling of her lower extremities.  They are very tender and painful by the end of the day.  She picked up some compression stockings and says it has made a big difference.  Her swelling is much better and her pain is much better.  Past medical history, Surgical history, Family history not pertinant except as noted below, Social history, Allergies, and medications have been entered into the medical record, reviewed, and corrections made.   Review of Systems: No fevers, chills, night sweats, weight loss, chest pain, or shortness of breath.   Objective:    General: Well Developed, well nourished, and in no acute distress.  Neuro: Alert and oriented x3, extra-ocular muscles intact, sensation grossly intact.  HEENT: Normocephalic, atraumatic  Skin: Warm and dry, no rashes. Cardiac: Regular rate and rhythm, no murmurs rubs or gallops, no lower extremity edema.  Respiratory: Clear to auscultation bilaterally. Not using accessory muscles, speaking in full sentences.   Impression and Recommendations:    HTN  - Well controlled. Continue current regimen. Follow up in  4 moths.  Due for CMP and lipid panel.  GAD -stable overall.  PHQ 9 score of 1 and gad 7 score of 3.  Rates her symptoms as not difficult.  Continue current regimen.  Follow-up in 6 months.   IBS -stable.  No  recent flares.  Chronic cough-we will put off on spirometry for now since she does feel like it is better and responds well to Zyrtec.  Given Pneumovax 23 today.  Venous stasis-continue wearing compression stockings.  Is made a big difference in her symptom relief.  To recheck vitamin D levels.

## 2017-08-23 ENCOUNTER — Encounter: Payer: Medicare HMO | Admitting: Family Medicine

## 2017-08-28 ENCOUNTER — Other Ambulatory Visit: Payer: Self-pay | Admitting: Family Medicine

## 2017-09-03 DIAGNOSIS — I10 Essential (primary) hypertension: Secondary | ICD-10-CM | POA: Diagnosis not present

## 2017-09-03 DIAGNOSIS — E559 Vitamin D deficiency, unspecified: Secondary | ICD-10-CM | POA: Diagnosis not present

## 2017-09-03 DIAGNOSIS — E7800 Pure hypercholesterolemia, unspecified: Secondary | ICD-10-CM | POA: Diagnosis not present

## 2017-09-04 ENCOUNTER — Other Ambulatory Visit: Payer: Self-pay | Admitting: *Deleted

## 2017-09-04 DIAGNOSIS — R899 Unspecified abnormal finding in specimens from other organs, systems and tissues: Secondary | ICD-10-CM

## 2017-09-04 LAB — LIPID PANEL
CHOLESTEROL: 180 mg/dL (ref <200)
HDL: 50 mg/dL — ABNORMAL LOW (ref >50)
LDL CHOLESTEROL (CALC): 108 mg/dL — AB
Non-HDL Cholesterol (Calc): 130 mg/dL (calc) — ABNORMAL HIGH (ref <130)
TRIGLYCERIDES: 113 mg/dL (ref <150)
Total CHOL/HDL Ratio: 3.6 (calc) (ref <5.0)

## 2017-09-04 LAB — COMPLETE METABOLIC PANEL WITH GFR
AG Ratio: 1.8 (calc) (ref 1.0–2.5)
ALKALINE PHOSPHATASE (APISO): 65 U/L (ref 33–130)
ALT: 11 U/L (ref 6–29)
AST: 19 U/L (ref 10–35)
Albumin: 4.4 g/dL (ref 3.6–5.1)
BILIRUBIN TOTAL: 1 mg/dL (ref 0.2–1.2)
BUN/Creatinine Ratio: 12 (calc) (ref 6–22)
BUN: 15 mg/dL (ref 7–25)
CHLORIDE: 100 mmol/L (ref 98–110)
CO2: 32 mmol/L (ref 20–32)
CREATININE: 1.21 mg/dL — AB (ref 0.60–0.88)
Calcium: 9.1 mg/dL (ref 8.6–10.4)
GFR, EST AFRICAN AMERICAN: 46 mL/min/{1.73_m2} — AB (ref >=60)
GFR, Est Non African American: 40 mL/min/{1.73_m2} — ABNORMAL LOW (ref >=60)
GLUCOSE: 76 mg/dL (ref 65–99)
Globulin: 2.4 g/dL (calc) (ref 1.9–3.7)
Potassium: 3.8 mmol/L (ref 3.5–5.3)
Sodium: 140 mmol/L (ref 135–146)
TOTAL PROTEIN: 6.8 g/dL (ref 6.1–8.1)

## 2017-09-04 LAB — VITAMIN D 25 HYDROXY (VIT D DEFICIENCY, FRACTURES): Vit D, 25-Hydroxy: 15 ng/mL — ABNORMAL LOW (ref 30–100)

## 2017-10-02 ENCOUNTER — Telehealth: Payer: Self-pay | Admitting: Family Medicine

## 2017-10-02 NOTE — Telephone Encounter (Signed)
Pt called stating she has been taking her Vitamin D for a month now and she was thinking Dr.Metheney wanted to her to have it checked again? Please call pt and advise if she needs to have this rechecked and when and place order IF so. Thanks

## 2017-10-02 NOTE — Telephone Encounter (Signed)
Pt advised of recommendations. She is to take Vit D 1000 IU daily, which she reports she has been taking. And have her kidney function rechecked this month due to the elevation. Pt advised that this has been faxed down to the lab.Laureen Ochs.Jaileigh Weimer, Viann Shoveonya [name], CMA

## 2017-10-11 ENCOUNTER — Other Ambulatory Visit: Payer: Self-pay | Admitting: Family Medicine

## 2017-10-11 DIAGNOSIS — F411 Generalized anxiety disorder: Secondary | ICD-10-CM

## 2017-10-11 NOTE — Telephone Encounter (Signed)
Pt looked up in [facility] database she last filled on 09/13/17 #30. Fwd to pcp for review and signature.Heath GoldBarkley, Keishawn Darsey [name], CMA

## 2017-10-15 ENCOUNTER — Ambulatory Visit (INDEPENDENT_AMBULATORY_CARE_PROVIDER_SITE_OTHER): Payer: Medicare HMO | Admitting: Family Medicine

## 2017-10-15 ENCOUNTER — Ambulatory Visit (INDEPENDENT_AMBULATORY_CARE_PROVIDER_SITE_OTHER): Payer: Medicare HMO

## 2017-10-15 VITALS — BP 146/77 | HR 70 | Temp 98.2°F | Wt 169.0 lb

## 2017-10-15 DIAGNOSIS — R05 Cough: Secondary | ICD-10-CM | POA: Diagnosis not present

## 2017-10-15 DIAGNOSIS — K449 Diaphragmatic hernia without obstruction or gangrene: Secondary | ICD-10-CM | POA: Diagnosis not present

## 2017-10-15 DIAGNOSIS — R059 Cough, unspecified: Secondary | ICD-10-CM

## 2017-10-15 MED ORDER — BENZONATATE 200 MG PO CAPS: 200.0000 mg | ORAL_CAPSULE | Freq: Three times a day (TID) | ORAL | 0 refills | Status: DC | PRN

## 2017-10-15 NOTE — Progress Notes (Signed)
Tonya [name] is a 84 y.o. female who presents to [facility] today for cough and congestion.  Tonya has a one-week history of worsening cough and congestion.  She was exposed to her son over a week ago who was experiencing cough congestion and other symptoms of upper respiratory infection.  She developed subjective fevers and chills which have since resolved.  Since then she notes a mildly productive cough associated with some wheezing.  She denies chest pain or severe shortness of breath.  She notes the cough is quite obnoxious at times.  She is using Delsym DM which helps a bit.   Past Medical History:  Diagnosis Date  . Anxiety   . Cystocele   . Degenerative disc disease   . Hypertension   . IBS (irritable bowel syndrome)   . Rectocele    Past Surgical History:  Procedure Laterality Date  . ABDOMINAL HYSTERECTOMY     partial  . CHOLECYSTECTOMY     Social History   Tobacco Use  . Smoking status: Never Smoker  . Smokeless tobacco: Never Used  Substance Use Topics  . Alcohol use: No   family history includes Depression in her son and unknown relative; GER disease in her son.  ROS as above:  Medications: Current Outpatient Medications  Medication Sig Dispense Refill  . ALPRAZolam (XANAX) 0.25 MG tablet TAKE 1/2 TO 1 TABLET(0.125 TO 0.25 MG) BY MOUTH DAILY AS NEEDED FOR ANXIETY 30 tablet 1  . diclofenac sodium (VOLTAREN) 1 % GEL Apply 2 g topically 4 (four) times daily. To affected joint. 100 g 11  . escitalopram (LEXAPRO) 10 MG tablet TAKE 1 TABLET(10 MG) BY MOUTH DAILY 90 tablet 1  . esomeprazole (NEXIUM) 40 MG capsule TAKE ONE CAPSULE BY MOUTH DAILY AT NOON 90 capsule 1  . hydrochlorothiazide (MICROZIDE) 12.5 MG capsule TAKE 1 CAPSULE(12.5 MG) BY MOUTH DAILY 90 capsule 1  . benzonatate (TESSALON) 200 MG capsule Take 1 capsule (200 mg total) by  mouth 3 (three) times daily as needed for cough. 45 capsule 0   No current facility-administered medications for this visit.    Allergies  Allergen Reactions  . Amlodipine Other (See Comments)    Ankle swelling.   . Codeine     Other reaction(s): Unknown  . Tramadol Nausea Only    Health Maintenance Health Maintenance  Topic Date Due  . INFLUENZA VACCINE  04/09/2018 (Originally 01/16/2018)  . TETANUS/TDAP  06/20/2021  . DEXA SCAN  Completed  . PNA vac Low Risk Adult  Completed     Exam:  BP (!) 146/77   Pulse 70   Temp 98.2 F (36.8 C) (Oral)   Wt 169 lb (76.7 kg)   BMI 26.47 kg/m  Gen: Well NAD nontoxic appearing HEENT: EOMI,  MMM no significant nasal discharge normal posterior pharynx.  Ear canals with wax bilaterally.  Mild cervical lymphadenopathy. Lungs: Normal work of breathing.  Slight coarse breath sounds right.  Breath sounds absent left lower lung fields present and normal upper left lung field. Heart: RRR no MRG Abd: NABS, Soft. Nondistended, Nontender Exts: Brisk capillary refill, warm and well perfused.   2 view chest x-ray shows bronchitic changes in the right lung field.  Left lung with significant large hiatal hernia stable from prior x-rays.  Possible consolidation versus atelectasis left lower lung field however this is not significantly different from prior x-rays to my interpretation. Awaiting formal radiology review. I  personally independently reviewed the images.    Assessment and Plan: 84 y.o. female with cough likely post viral or bronchitic type.  Discussed treatment options.  Plan for continued over-the-counter medications and Tessalon Perles.  If not better next step would likely be hydrocodone-based cough medication.  Prednisone would certainly be a back-up plan if not better at all.  We will try to hold off prescribing antibiotics unless radiology sees an obvious infiltrate.  Patient is afebrile with normal oxygen saturation today.  Very  doubtful for pneumonia.   Orders Placed This Encounter  Procedures  . DG Chest 2 View    Order Specific Question:   Reason for exam:    Answer:   Cough, assess intra-thoracic pathology    Order Specific Question:   Preferred imaging location?    Answer:   [facility]   Meds ordered this encounter  Medications  . benzonatate (TESSALON) 200 MG capsule    Sig: Take 1 capsule (200 mg total) by mouth 3 (three) times daily as needed for cough.    Dispense:  45 capsule    Refill:  0     Discussed warning signs or symptoms. Please see discharge instructions. Patient expresses understanding.

## 2017-10-15 NOTE — Patient Instructions (Signed)
Thank you for coming in today. Try the tessalon pearles.  If cough is not controlled we can use hydrocodone cough medicines as needed. Let me know.  Prednisone is the backup if not getting better slowly.  We will send in antibiotics if we see pneumonia on xray.   Recheck as needed.    Acute Bronchitis, Adult Acute bronchitis is sudden (acute) swelling of the air tubes (bronchi) in the lungs. Acute bronchitis causes these tubes to fill with mucus, which can make it hard to breathe. It can also cause coughing or wheezing. In adults, acute bronchitis usually goes away within 2 weeks. A cough caused by bronchitis may last up to 3 weeks. Smoking, allergies, and asthma can make the condition worse. Repeated episodes of bronchitis may cause further lung problems, such as chronic obstructive pulmonary disease (COPD). What are the causes? This condition can be caused by germs and by substances that irritate the lungs, including:  Cold and flu viruses. This condition is most often caused by the same virus that causes a cold.  Bacteria.  Exposure to tobacco smoke, dust, fumes, and air pollution.  What increases the risk? This condition is more likely to develop in people who:  Have close contact with someone with acute bronchitis.  Are exposed to lung irritants, such as tobacco smoke, dust, fumes, and vapors.  Have a weak immune system.  Have a respiratory condition such as asthma.  What are the signs or symptoms? Symptoms of this condition include:  A cough.  Coughing up clear, yellow, or green mucus.  Wheezing.  Chest congestion.  Shortness of breath.  A fever.  Body aches.  Chills.  A sore throat.  How is this diagnosed? This condition is usually diagnosed with a physical exam. During the exam, your health care provider may order tests, such as chest X-rays, to rule out other conditions. He or she may also:  Test a sample of your mucus for bacterial infection.  Check  the level of oxygen in your blood. This is done to check for pneumonia.  Do a chest X-ray or lung function testing to rule out pneumonia and other conditions.  Perform blood tests.  Your health care provider will also ask about your symptoms and medical history. How is this treated? Most cases of acute bronchitis clear up over time without treatment. Your health care provider may recommend:  Drinking more fluids. Drinking more makes your mucus thinner, which may make it easier to breathe.  Taking a medicine for a fever or cough.  Taking an antibiotic medicine.  Using an inhaler to help improve shortness of breath and to control a cough.  Using a cool mist vaporizer or humidifier to make it easier to breathe.  Follow these instructions at home: Medicines  Take over-the-counter and prescription medicines only as told by your health care provider.  If you were prescribed an antibiotic, take it as told by your health care provider. Do not stop taking the antibiotic even if you start to feel better. General instructions  Get plenty of rest.  Drink enough fluids to keep your urine clear or pale yellow.  Avoid smoking and secondhand smoke. Exposure to cigarette smoke or irritating chemicals will make bronchitis worse. If you smoke and you need help quitting, ask your health care provider. Quitting smoking will help your lungs heal faster.  Use an inhaler, cool mist vaporizer, or humidifier as told by your health care provider.  Keep all follow-up visits as told by  your health care provider. This is important. How is this prevented? To lower your risk of getting this condition again:  Wash your hands often with soap and water. If soap and water are not available, use hand sanitizer.  Avoid contact with people who have cold symptoms.  Try not to touch your hands to your mouth, nose, or eyes.  Make sure to get the flu shot every year.  Contact a health care provider if:  Your  symptoms do not improve in 2 weeks of treatment. Get help right away if:  You cough up blood.  You have chest pain.  You have severe shortness of breath.  You become dehydrated.  You faint or keep feeling like you are going to faint.  You keep vomiting.  You have a severe headache.  Your fever or chills gets worse. This information is not intended to replace advice given to you by your health care provider. Make sure you discuss any questions you have with your health care provider. Document Released: 07/12/2004 Document Revised: 12/28/2015 Document Reviewed: 11/23/2015 Elsevier Interactive Patient Education  [employer].

## 2017-11-04 ENCOUNTER — Other Ambulatory Visit: Payer: Self-pay | Admitting: Family Medicine

## 2017-11-20 DIAGNOSIS — M172 Bilateral post-traumatic osteoarthritis of knee: Secondary | ICD-10-CM | POA: Diagnosis not present

## 2017-11-20 DIAGNOSIS — M170 Bilateral primary osteoarthritis of knee: Secondary | ICD-10-CM | POA: Diagnosis not present

## 2017-12-10 ENCOUNTER — Other Ambulatory Visit: Payer: Self-pay | Admitting: Family Medicine

## 2017-12-10 DIAGNOSIS — F411 Generalized anxiety disorder: Secondary | ICD-10-CM

## 2018-01-09 ENCOUNTER — Other Ambulatory Visit: Payer: Self-pay | Admitting: Family Medicine

## 2018-01-09 DIAGNOSIS — F411 Generalized anxiety disorder: Secondary | ICD-10-CM

## 2018-01-28 ENCOUNTER — Encounter: Payer: Self-pay | Admitting: Family Medicine

## 2018-01-28 ENCOUNTER — Ambulatory Visit (INDEPENDENT_AMBULATORY_CARE_PROVIDER_SITE_OTHER): Payer: Medicare HMO | Admitting: Family Medicine

## 2018-01-28 VITALS — BP 135/75 | HR 75 | Ht 67.0 in | Wt 164.0 lb

## 2018-01-28 DIAGNOSIS — I10 Essential (primary) hypertension: Secondary | ICD-10-CM | POA: Diagnosis not present

## 2018-01-28 DIAGNOSIS — R252 Cramp and spasm: Secondary | ICD-10-CM

## 2018-01-28 DIAGNOSIS — M25551 Pain in right hip: Secondary | ICD-10-CM | POA: Diagnosis not present

## 2018-01-28 DIAGNOSIS — R29898 Other symptoms and signs involving the musculoskeletal system: Secondary | ICD-10-CM | POA: Diagnosis not present

## 2018-01-28 DIAGNOSIS — F411 Generalized anxiety disorder: Secondary | ICD-10-CM | POA: Diagnosis not present

## 2018-01-28 MED ORDER — ALPRAZOLAM 0.25 MG PO TABS: 0.2500 mg | ORAL_TABLET | Freq: Every day | ORAL | 1 refills | Status: DC | PRN

## 2018-01-28 NOTE — Progress Notes (Signed)
Subjective:    Patient ID: Tonya [name], female    DOB: [birth year], 84 y.o.   MRN: [id number]  HPI   Her daughter is here with her today.   C/O Right hip pain. Ortho has told her she has arthritis but it does bother her at time. Mostly in the groin crease. occ keeps her up.  She has to use her arms to rise out of chair.     Hypertension- Pt denies chest pain, SOB, dizziness, or heart palpitations.  Taking meds as directed w/o problems.  Denies medication side effects.    F/U GAD - she has been trying to use some new strategies to reduce her anxiety when she feels hse is getting a panic attack. She has been using xanax occasionally and sometime when she can't sleep. She says she is not using them daily but she has been filling them about every30 days.    Also c/ of muscle cramping.  Has had cramping in her leg and feet for years but more recently has been having cramping in her hands and right arm which is new.  No major change in acitivity.   Review of Systems  BP 135/75   Pulse 75   Ht 5\' 7"  (1.702 m)   Wt 164 lb (74.4 kg)   SpO2 96%   BMI 25.69 kg/m     Allergies  Allergen Reactions  . Amlodipine Other (See Comments)    Ankle swelling.   . Codeine     Other reaction(s): Unknown  . Tramadol Nausea Only    Past Medical History:  Diagnosis Date  . Anxiety   . Cystocele   . Degenerative disc disease   . Hypertension   . IBS (irritable bowel syndrome)   . Rectocele     Past Surgical History:  Procedure Laterality Date  . ABDOMINAL HYSTERECTOMY     partial  . CHOLECYSTECTOMY      Social History   Socioeconomic History  . Marital status: Widowed    Spouse name: Not on file  . Number of children: Not on file  . Years of education: Not on file  . Highest education level: Not on file  Occupational History  . Not on file  Social Needs  . Financial resource strain: Not on file  . Food insecurity:    Worry: Not on file    Inability: Not on file  .  Transportation needs:    Medical: Not on file    Non-medical: Not on file  Tobacco Use  . Smoking status: Never Smoker  . Smokeless tobacco: Never Used  Substance and Sexual Activity  . Alcohol use: No  . Drug use: No  . 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  Lifestyle  . Physical activity:    Days per week: Not on file    Minutes per session: Not on file  . Stress: Not on file  Relationships  . Social connections:    Talks on phone: Not on file    Gets together: Not on file    Attends religious service: Not on file    Active member of club or organization: Not on file    Attends meetings of clubs or organizations: Not on file    Relationship status: Not on file  . Intimate partner violence:    Fear of current or ex partner: Not on file  Emotionally abused: Not on file    Physically abused: Not on file    Forced sexual activity: Not on file  Other Topics Concern  . Not on file  Social History Narrative  . Not on file    Family History  Problem Relation Age of Onset  . Depression Unknown   . GER disease Son   . Depression Son     Outpatient Encounter Medications as of 01/28/2018  Medication Sig  . ALPRAZolam (XANAX) 0.25 MG tablet TAKE 1/2 TO 1 TABLET(0.125 TO 0.25 MG) BY MOUTH DAILY AS NEEDED FOR ANXIETY  . diclofenac sodium (VOLTAREN) 1 % GEL Apply 2 g topically 4 (four) times daily. To affected joint.  Marland Kitchen. escitalopram (LEXAPRO) 10 MG tablet TAKE 1 TABLET(10 MG) BY MOUTH DAILY  . esomeprazole (NEXIUM) 40 MG capsule TAKE ONE CAPSULE BY MOUTH DAILY AT NOON  . hydrochlorothiazide (MICROZIDE) 12.5 MG capsule TAKE 1 CAPSULE(12.5 MG) BY MOUTH DAILY  . [DISCONTINUED] benzonatate (TESSALON) 200 MG capsule TAKE 1 CAPSULE(200 MG) BY MOUTH THREE TIMES DAILY AS NEEDED FOR COUGH   No facility-administered encounter medications on file as of 01/28/2018.            Objective:   Physical Exam  Constitutional: She is oriented to person, place, and time. She appears well-developed and well-nourished.  HENT:  Head: Normocephalic and atraumatic.  Cardiovascular: Normal rate, regular rhythm and normal heart sounds.  Pulmonary/Chest: Effort normal and breath sounds normal.  Neurological: She is alert and oriented to person, place, and time.  Skin: Skin is warm and dry.  Psychiatric: She has a normal mood and affect. Her behavior is normal.          Assessment & Plan:  HTN -Well controlled. Continue current regimen. Follow up in  4 months.   Anxiety D/O - reminded to wan use of her xanax. ON next refill will decrease dose to 25 tabs per month instead of 30 tabs per month.  Use only for panic attacks and try to use minimally for sleep issues. Using her coping strategies instead. Continue Lexapro.   Muscle cramping - Will work up with additional labs.  Consider trial of B6 30mg  daily if labs are unrevealing.    Right hip pain - will refer for PT.  Know OA in that joint.  Not a candidate for hip replacement.

## 2018-01-29 ENCOUNTER — Encounter: Payer: Self-pay | Admitting: Family Medicine

## 2018-01-30 DIAGNOSIS — M2041 Other hammer toe(s) (acquired), right foot: Secondary | ICD-10-CM | POA: Diagnosis not present

## 2018-01-30 DIAGNOSIS — K862 Cyst of pancreas: Secondary | ICD-10-CM | POA: Diagnosis not present

## 2018-01-30 DIAGNOSIS — M2042 Other hammer toe(s) (acquired), left foot: Secondary | ICD-10-CM | POA: Diagnosis not present

## 2018-01-30 DIAGNOSIS — M25511 Pain in right shoulder: Secondary | ICD-10-CM | POA: Diagnosis not present

## 2018-01-30 DIAGNOSIS — M7742 Metatarsalgia, left foot: Secondary | ICD-10-CM | POA: Diagnosis not present

## 2018-01-30 DIAGNOSIS — M7741 Metatarsalgia, right foot: Secondary | ICD-10-CM | POA: Diagnosis not present

## 2018-01-30 DIAGNOSIS — M25551 Pain in right hip: Secondary | ICD-10-CM | POA: Diagnosis not present

## 2018-01-30 DIAGNOSIS — M1611 Unilateral primary osteoarthritis, right hip: Secondary | ICD-10-CM | POA: Diagnosis not present

## 2018-01-30 DIAGNOSIS — K863 Pseudocyst of pancreas: Secondary | ICD-10-CM | POA: Diagnosis not present

## 2018-01-31 ENCOUNTER — Telehealth: Payer: Self-pay

## 2018-01-31 DIAGNOSIS — M25551 Pain in right hip: Secondary | ICD-10-CM

## 2018-01-31 DIAGNOSIS — R29898 Other symptoms and signs involving the musculoskeletal system: Secondary | ICD-10-CM

## 2018-01-31 MED ORDER — AMBULATORY NON FORMULARY MEDICATION: 0 refills | Status: DC

## 2018-01-31 NOTE — Telephone Encounter (Signed)
Myra with home health states [place] needs a walker with a seat. Rollator with seat to [facility]. Please sign if appropriate.

## 2018-01-31 NOTE — Telephone Encounter (Signed)
Printed and signed.  

## 2018-02-04 DIAGNOSIS — M7741 Metatarsalgia, right foot: Secondary | ICD-10-CM | POA: Diagnosis not present

## 2018-02-04 DIAGNOSIS — M7742 Metatarsalgia, left foot: Secondary | ICD-10-CM | POA: Diagnosis not present

## 2018-02-04 DIAGNOSIS — M1611 Unilateral primary osteoarthritis, right hip: Secondary | ICD-10-CM | POA: Diagnosis not present

## 2018-02-04 DIAGNOSIS — M2042 Other hammer toe(s) (acquired), left foot: Secondary | ICD-10-CM | POA: Diagnosis not present

## 2018-02-04 DIAGNOSIS — M170 Bilateral primary osteoarthritis of knee: Secondary | ICD-10-CM | POA: Diagnosis not present

## 2018-02-04 DIAGNOSIS — M2041 Other hammer toe(s) (acquired), right foot: Secondary | ICD-10-CM | POA: Diagnosis not present

## 2018-02-04 DIAGNOSIS — K863 Pseudocyst of pancreas: Secondary | ICD-10-CM | POA: Diagnosis not present

## 2018-02-04 DIAGNOSIS — K862 Cyst of pancreas: Secondary | ICD-10-CM | POA: Diagnosis not present

## 2018-02-04 DIAGNOSIS — M172 Bilateral post-traumatic osteoarthritis of knee: Secondary | ICD-10-CM | POA: Diagnosis not present

## 2018-02-04 DIAGNOSIS — M25511 Pain in right shoulder: Secondary | ICD-10-CM | POA: Diagnosis not present

## 2018-02-04 DIAGNOSIS — M25551 Pain in right hip: Secondary | ICD-10-CM | POA: Diagnosis not present

## 2018-02-06 DIAGNOSIS — M7741 Metatarsalgia, right foot: Secondary | ICD-10-CM | POA: Diagnosis not present

## 2018-02-06 DIAGNOSIS — M25551 Pain in right hip: Secondary | ICD-10-CM | POA: Diagnosis not present

## 2018-02-06 DIAGNOSIS — K862 Cyst of pancreas: Secondary | ICD-10-CM | POA: Diagnosis not present

## 2018-02-06 DIAGNOSIS — M1611 Unilateral primary osteoarthritis, right hip: Secondary | ICD-10-CM | POA: Diagnosis not present

## 2018-02-06 DIAGNOSIS — M25511 Pain in right shoulder: Secondary | ICD-10-CM | POA: Diagnosis not present

## 2018-02-06 DIAGNOSIS — M2041 Other hammer toe(s) (acquired), right foot: Secondary | ICD-10-CM | POA: Diagnosis not present

## 2018-02-06 DIAGNOSIS — M7742 Metatarsalgia, left foot: Secondary | ICD-10-CM | POA: Diagnosis not present

## 2018-02-06 DIAGNOSIS — M2042 Other hammer toe(s) (acquired), left foot: Secondary | ICD-10-CM | POA: Diagnosis not present

## 2018-02-06 DIAGNOSIS — K863 Pseudocyst of pancreas: Secondary | ICD-10-CM | POA: Diagnosis not present

## 2018-02-11 ENCOUNTER — Telehealth: Payer: Self-pay

## 2018-02-11 DIAGNOSIS — M25551 Pain in right hip: Secondary | ICD-10-CM | POA: Diagnosis not present

## 2018-02-11 DIAGNOSIS — M1611 Unilateral primary osteoarthritis, right hip: Secondary | ICD-10-CM | POA: Diagnosis not present

## 2018-02-11 DIAGNOSIS — M2041 Other hammer toe(s) (acquired), right foot: Secondary | ICD-10-CM | POA: Diagnosis not present

## 2018-02-11 DIAGNOSIS — M7741 Metatarsalgia, right foot: Secondary | ICD-10-CM | POA: Diagnosis not present

## 2018-02-11 DIAGNOSIS — K863 Pseudocyst of pancreas: Secondary | ICD-10-CM | POA: Diagnosis not present

## 2018-02-11 DIAGNOSIS — M7742 Metatarsalgia, left foot: Secondary | ICD-10-CM | POA: Diagnosis not present

## 2018-02-11 DIAGNOSIS — M25511 Pain in right shoulder: Secondary | ICD-10-CM | POA: Diagnosis not present

## 2018-02-11 DIAGNOSIS — M2042 Other hammer toe(s) (acquired), left foot: Secondary | ICD-10-CM | POA: Diagnosis not present

## 2018-02-11 DIAGNOSIS — K862 Cyst of pancreas: Secondary | ICD-10-CM | POA: Diagnosis not present

## 2018-02-11 NOTE — Telephone Encounter (Signed)
Spoke w/pt and advise her of recommendations. She informed me that she has been using a rollator and feels that she over did it. She said that she is having pain in her groin, denies and diarrhea. She said that she was just so excited about walking upright and was doing a lot of it. She said that she sees Dr. Willette ClusterBayshore who is her ortho guy she said she would f/u w/him about the pain.Laureen Ochs.Jordani Nunn, Viann Shoveonya [name]

## 2018-02-11 NOTE — Telephone Encounter (Signed)
I agree. Needs appt. Can put with 1 of our sports med docs for the hip pain and then we can address the abdominal pain a little later unless it is severe.  She does have a history of irritable bowel also does go through bouts of abdominal pain and diarrhea at times.

## 2018-02-11 NOTE — Telephone Encounter (Signed)
Home Health called and states she is having hip joint pain and stomach pain. I advised Home Health to have patient come in for evaluation. I did mention she should have diclofenac gel for the joint pain. She states the diclofenac didn't really help with the pain. I stated she will have to be evaluated for the abdominal pain.

## 2018-02-13 DIAGNOSIS — K863 Pseudocyst of pancreas: Secondary | ICD-10-CM | POA: Diagnosis not present

## 2018-02-13 DIAGNOSIS — M2042 Other hammer toe(s) (acquired), left foot: Secondary | ICD-10-CM | POA: Diagnosis not present

## 2018-02-13 DIAGNOSIS — M2041 Other hammer toe(s) (acquired), right foot: Secondary | ICD-10-CM | POA: Diagnosis not present

## 2018-02-13 DIAGNOSIS — M25551 Pain in right hip: Secondary | ICD-10-CM | POA: Diagnosis not present

## 2018-02-13 DIAGNOSIS — M25511 Pain in right shoulder: Secondary | ICD-10-CM | POA: Diagnosis not present

## 2018-02-13 DIAGNOSIS — M7742 Metatarsalgia, left foot: Secondary | ICD-10-CM | POA: Diagnosis not present

## 2018-02-13 DIAGNOSIS — K862 Cyst of pancreas: Secondary | ICD-10-CM | POA: Diagnosis not present

## 2018-02-13 DIAGNOSIS — M7741 Metatarsalgia, right foot: Secondary | ICD-10-CM | POA: Diagnosis not present

## 2018-02-13 DIAGNOSIS — M1611 Unilateral primary osteoarthritis, right hip: Secondary | ICD-10-CM | POA: Diagnosis not present

## 2018-02-15 DIAGNOSIS — F419 Anxiety disorder, unspecified: Secondary | ICD-10-CM | POA: Diagnosis not present

## 2018-02-15 DIAGNOSIS — Z885 Allergy status to narcotic agent status: Secondary | ICD-10-CM | POA: Insufficient documentation

## 2018-02-15 DIAGNOSIS — K3580 Unspecified acute appendicitis: Principal | ICD-10-CM | POA: Insufficient documentation

## 2018-02-15 DIAGNOSIS — K449 Diaphragmatic hernia without obstruction or gangrene: Secondary | ICD-10-CM | POA: Insufficient documentation

## 2018-02-15 DIAGNOSIS — Z888 Allergy status to other drugs, medicaments and biological substances status: Secondary | ICD-10-CM | POA: Diagnosis not present

## 2018-02-15 DIAGNOSIS — K589 Irritable bowel syndrome without diarrhea: Secondary | ICD-10-CM | POA: Diagnosis not present

## 2018-02-15 DIAGNOSIS — K3589 Other acute appendicitis without perforation or gangrene: Secondary | ICD-10-CM | POA: Insufficient documentation

## 2018-02-15 DIAGNOSIS — M1990 Unspecified osteoarthritis, unspecified site: Secondary | ICD-10-CM | POA: Insufficient documentation

## 2018-02-15 DIAGNOSIS — I10 Essential (primary) hypertension: Secondary | ICD-10-CM | POA: Insufficient documentation

## 2018-02-15 DIAGNOSIS — R109 Unspecified abdominal pain: Secondary | ICD-10-CM | POA: Diagnosis not present

## 2018-02-15 DIAGNOSIS — Z9049 Acquired absence of other specified parts of digestive tract: Secondary | ICD-10-CM | POA: Diagnosis not present

## 2018-02-15 DIAGNOSIS — Z8379 Family history of other diseases of the digestive system: Secondary | ICD-10-CM | POA: Diagnosis not present

## 2018-02-15 DIAGNOSIS — Z90710 Acquired absence of both cervix and uterus: Secondary | ICD-10-CM | POA: Diagnosis not present

## 2018-02-15 DIAGNOSIS — Z820 Family history of epilepsy and other diseases of the nervous system: Secondary | ICD-10-CM | POA: Diagnosis not present

## 2018-02-16 ENCOUNTER — Encounter (HOSPITAL_BASED_OUTPATIENT_CLINIC_OR_DEPARTMENT_OTHER): Payer: Self-pay | Admitting: Emergency Medicine

## 2018-02-16 ENCOUNTER — Emergency Department (HOSPITAL_COMMUNITY): Payer: Medicare HMO | Admitting: Certified Registered Nurse Anesthetist

## 2018-02-16 ENCOUNTER — Observation Stay (HOSPITAL_BASED_OUTPATIENT_CLINIC_OR_DEPARTMENT_OTHER)
Admission: EM | Admit: 2018-02-16 | Discharge: 2018-02-17 | Disposition: A | Discharge status: Home / Self Care | Payer: Medicare HMO | Attending: General Surgery | Admitting: General Surgery

## 2018-02-16 ENCOUNTER — Encounter (HOSPITAL_COMMUNITY)
Admission: EM | Disposition: A | Discharge status: Home / Self Care | Payer: Self-pay | Source: Home / Self Care | Attending: Emergency Medicine

## 2018-02-16 ENCOUNTER — Emergency Department (HOSPITAL_BASED_OUTPATIENT_CLINIC_OR_DEPARTMENT_OTHER): Payer: Medicare HMO

## 2018-02-16 ENCOUNTER — Other Ambulatory Visit: Payer: Self-pay

## 2018-02-16 DIAGNOSIS — F419 Anxiety disorder, unspecified: Secondary | ICD-10-CM | POA: Diagnosis not present

## 2018-02-16 DIAGNOSIS — I10 Essential (primary) hypertension: Secondary | ICD-10-CM | POA: Diagnosis not present

## 2018-02-16 DIAGNOSIS — Z888 Allergy status to other drugs, medicaments and biological substances status: Secondary | ICD-10-CM | POA: Diagnosis not present

## 2018-02-16 DIAGNOSIS — K3589 Other acute appendicitis without perforation or gangrene: Secondary | ICD-10-CM

## 2018-02-16 DIAGNOSIS — M1990 Unspecified osteoarthritis, unspecified site: Secondary | ICD-10-CM | POA: Diagnosis not present

## 2018-02-16 DIAGNOSIS — K219 Gastro-esophageal reflux disease without esophagitis: Secondary | ICD-10-CM | POA: Diagnosis not present

## 2018-02-16 DIAGNOSIS — Z8379 Family history of other diseases of the digestive system: Secondary | ICD-10-CM | POA: Diagnosis not present

## 2018-02-16 DIAGNOSIS — Z9049 Acquired absence of other specified parts of digestive tract: Secondary | ICD-10-CM | POA: Diagnosis not present

## 2018-02-16 DIAGNOSIS — Z820 Family history of epilepsy and other diseases of the nervous system: Secondary | ICD-10-CM | POA: Diagnosis not present

## 2018-02-16 DIAGNOSIS — K589 Irritable bowel syndrome without diarrhea: Secondary | ICD-10-CM | POA: Diagnosis not present

## 2018-02-16 DIAGNOSIS — K3580 Unspecified acute appendicitis: Secondary | ICD-10-CM | POA: Diagnosis not present

## 2018-02-16 DIAGNOSIS — Z90710 Acquired absence of both cervix and uterus: Secondary | ICD-10-CM | POA: Diagnosis not present

## 2018-02-16 DIAGNOSIS — R109 Unspecified abdominal pain: Secondary | ICD-10-CM | POA: Diagnosis not present

## 2018-02-16 DIAGNOSIS — Z885 Allergy status to narcotic agent status: Secondary | ICD-10-CM | POA: Diagnosis not present

## 2018-02-16 DIAGNOSIS — K37 Unspecified appendicitis: Secondary | ICD-10-CM | POA: Diagnosis not present

## 2018-02-16 DIAGNOSIS — K449 Diaphragmatic hernia without obstruction or gangrene: Secondary | ICD-10-CM | POA: Diagnosis not present

## 2018-02-16 HISTORY — PX: LAPAROSCOPIC APPENDECTOMY: SHX408

## 2018-02-16 LAB — COMPREHENSIVE METABOLIC PANEL
ALBUMIN: 3.6 g/dL (ref 3.5–5.0)
ALT: 12 U/L (ref 0–44)
ANION GAP: 12 (ref 5–15)
AST: 18 U/L (ref 15–41)
Alkaline Phosphatase: 63 U/L (ref 38–126)
BUN: 19 mg/dL (ref 8–23)
CHLORIDE: 100 mmol/L (ref 98–111)
CO2: 28 mmol/L (ref 22–32)
Calcium: 8.3 mg/dL — ABNORMAL LOW (ref 8.9–10.3)
Creatinine, Ser: 0.98 mg/dL (ref 0.44–1.00)
GFR calc Af Amer: 58 mL/min — ABNORMAL LOW (ref >60)
GFR calc non Af Amer: 50 mL/min — ABNORMAL LOW (ref >60)
GLUCOSE: 128 mg/dL — AB (ref 70–99)
POTASSIUM: 3 mmol/L — AB (ref 3.5–5.1)
SODIUM: 140 mmol/L (ref 135–145)
TOTAL PROTEIN: 6.4 g/dL — AB (ref 6.5–8.1)
Total Bilirubin: 1.7 mg/dL — ABNORMAL HIGH (ref 0.3–1.2)

## 2018-02-16 LAB — URINALYSIS, MICROSCOPIC (REFLEX)

## 2018-02-16 LAB — CBC WITH DIFFERENTIAL/PLATELET
BASOS PCT: 0 %
Basophils Absolute: 0 10*3/uL (ref 0.0–0.1)
EOS ABS: 0.1 10*3/uL (ref 0.0–0.7)
Eosinophils Relative: 1 %
HCT: 40 % (ref 36.0–46.0)
Hemoglobin: 13.6 g/dL (ref 12.0–15.0)
LYMPHS PCT: 6 %
Lymphs Abs: 0.6 10*3/uL — ABNORMAL LOW (ref 0.7–4.0)
MCH: 30.6 pg (ref 26.0–34.0)
MCHC: 34 g/dL (ref 30.0–36.0)
MCV: 89.9 fL (ref 78.0–100.0)
MONOS PCT: 14 %
Monocytes Absolute: 1.5 10*3/uL — ABNORMAL HIGH (ref 0.1–1.0)
NEUTROS ABS: 8.2 10*3/uL — AB (ref 1.7–7.7)
NEUTROS PCT: 79 %
PLATELETS: 198 10*3/uL (ref 150–400)
RBC: 4.45 MIL/uL (ref 3.87–5.11)
RDW: 14.2 % (ref 11.5–15.5)
WBC: 10.4 10*3/uL (ref 4.0–10.5)

## 2018-02-16 LAB — URINALYSIS, ROUTINE W REFLEX MICROSCOPIC
BILIRUBIN URINE: NEGATIVE
GLUCOSE, UA: NEGATIVE mg/dL
Ketones, ur: NEGATIVE mg/dL
NITRITE: NEGATIVE
PH: 6 (ref 5.0–8.0)
Protein, ur: NEGATIVE mg/dL

## 2018-02-16 LAB — LIPASE, BLOOD: Lipase: 30 U/L (ref 11–51)

## 2018-02-16 SURGERY — APPENDECTOMY, LAPAROSCOPIC
Anesthesia: General | Site: Abdomen

## 2018-02-16 MED ORDER — PROPOFOL 10 MG/ML IV BOLUS
INTRAVENOUS | Status: DC | PRN
  Administered 2018-02-16: 100 mg via INTRAVENOUS

## 2018-02-16 MED ORDER — ROCURONIUM BROMIDE 10 MG/ML (PF) SYRINGE
PREFILLED_SYRINGE | INTRAVENOUS | Status: DC | PRN
  Administered 2018-02-16: 30 mg via INTRAVENOUS

## 2018-02-16 MED ORDER — ONDANSETRON HCL 4 MG/2ML IJ SOLN
4.0000 mg | Freq: Four times a day (QID) | INTRAMUSCULAR | Status: DC | PRN
  Administered 2018-02-16: 4 mg via INTRAVENOUS
  Filled 2018-02-16: qty 2

## 2018-02-16 MED ORDER — EPHEDRINE SULFATE-NACL 50-0.9 MG/10ML-% IV SOSY
PREFILLED_SYRINGE | INTRAVENOUS | Status: DC | PRN
  Administered 2018-02-16: 5 mg via INTRAVENOUS

## 2018-02-16 MED ORDER — OXYCODONE HCL 5 MG PO TABS
2.5000 mg | ORAL_TABLET | ORAL | Status: DC | PRN
  Administered 2018-02-16: 2.5 mg via ORAL
  Administered 2018-02-16 – 2018-02-17 (×3): 5 mg via ORAL
  Filled 2018-02-16 (×4): qty 1

## 2018-02-16 MED ORDER — SUGAMMADEX SODIUM 200 MG/2ML IV SOLN
INTRAVENOUS | Status: AC
  Filled 2018-02-16: qty 2

## 2018-02-16 MED ORDER — FENTANYL CITRATE (PF) 100 MCG/2ML IJ SOLN: 25.0000 ug | INTRAMUSCULAR | Status: DC | PRN

## 2018-02-16 MED ORDER — DEXAMETHASONE SODIUM PHOSPHATE 4 MG/ML IJ SOLN
INTRAMUSCULAR | Status: DC | PRN
  Administered 2018-02-16: 5 mg via INTRAVENOUS

## 2018-02-16 MED ORDER — ALPRAZOLAM 0.25 MG PO TABS
0.2500 mg | ORAL_TABLET | Freq: Every day | ORAL | Status: DC | PRN
  Administered 2018-02-16: 0.25 mg via ORAL
  Filled 2018-02-16: qty 1

## 2018-02-16 MED ORDER — PHENYLEPHRINE 40 MCG/ML (10ML) SYRINGE FOR IV PUSH (FOR BLOOD PRESSURE SUPPORT)
PREFILLED_SYRINGE | INTRAVENOUS | Status: DC | PRN
  Administered 2018-02-16: 80 ug via INTRAVENOUS
  Administered 2018-02-16: 40 ug via INTRAVENOUS
  Administered 2018-02-16 (×3): 80 ug via INTRAVENOUS

## 2018-02-16 MED ORDER — ONDANSETRON HCL 4 MG/2ML IJ SOLN
INTRAMUSCULAR | Status: AC
  Filled 2018-02-16: qty 2

## 2018-02-16 MED ORDER — FENTANYL CITRATE (PF) 100 MCG/2ML IJ SOLN
25.0000 ug | Freq: Once | INTRAMUSCULAR | Status: AC
  Administered 2018-02-16: 25 ug via INTRAVENOUS
  Filled 2018-02-16: qty 2

## 2018-02-16 MED ORDER — PANTOPRAZOLE SODIUM 40 MG PO TBEC
40.0000 mg | DELAYED_RELEASE_TABLET | Freq: Every day | ORAL | Status: DC
  Administered 2018-02-16 – 2018-02-17 (×2): 40 mg via ORAL
  Filled 2018-02-16 (×2): qty 1

## 2018-02-16 MED ORDER — SIMETHICONE 80 MG PO CHEW: 40.0000 mg | CHEWABLE_TABLET | Freq: Four times a day (QID) | ORAL | Status: DC | PRN

## 2018-02-16 MED ORDER — ESCITALOPRAM OXALATE 10 MG PO TABS
10.0000 mg | ORAL_TABLET | Freq: Every day | ORAL | Status: DC
  Administered 2018-02-16 – 2018-02-17 (×2): 10 mg via ORAL
  Filled 2018-02-16 (×2): qty 1

## 2018-02-16 MED ORDER — KCL IN DEXTROSE-NACL 20-5-0.45 MEQ/L-%-% IV SOLN
INTRAVENOUS | Status: DC
  Administered 2018-02-16: 16:00:00 via INTRAVENOUS
  Filled 2018-02-16 (×2): qty 1000

## 2018-02-16 MED ORDER — BUPIVACAINE-EPINEPHRINE (PF) 0.25% -1:200000 IJ SOLN
INTRAMUSCULAR | Status: AC
  Filled 2018-02-16: qty 30

## 2018-02-16 MED ORDER — IOPAMIDOL (ISOVUE-300) INJECTION 61%
100.0000 mL | Freq: Once | INTRAVENOUS | Status: AC | PRN
  Administered 2018-02-16: 100 mL via INTRAVENOUS

## 2018-02-16 MED ORDER — ONDANSETRON HCL 4 MG/2ML IJ SOLN
INTRAMUSCULAR | Status: DC | PRN
  Administered 2018-02-16: 4 mg via INTRAVENOUS

## 2018-02-16 MED ORDER — LIDOCAINE 2% (20 MG/ML) 5 ML SYRINGE
INTRAMUSCULAR | Status: DC | PRN
  Administered 2018-02-16: 60 mg via INTRAVENOUS

## 2018-02-16 MED ORDER — PIPERACILLIN-TAZOBACTAM 3.375 G IVPB
3.3750 g | Freq: Three times a day (TID) | INTRAVENOUS | Status: AC
  Administered 2018-02-16 (×2): 3.375 g via INTRAVENOUS
  Filled 2018-02-16 (×2): qty 50

## 2018-02-16 MED ORDER — LIDOCAINE 2% (20 MG/ML) 5 ML SYRINGE
INTRAMUSCULAR | Status: AC
  Filled 2018-02-16: qty 5

## 2018-02-16 MED ORDER — HYDROCHLOROTHIAZIDE 12.5 MG PO CAPS
12.5000 mg | ORAL_CAPSULE | Freq: Every day | ORAL | Status: DC
  Administered 2018-02-16: 12.5 mg via ORAL
  Filled 2018-02-16: qty 1

## 2018-02-16 MED ORDER — FENTANYL CITRATE (PF) 100 MCG/2ML IJ SOLN
INTRAMUSCULAR | Status: DC | PRN
  Administered 2018-02-16 (×2): 50 ug via INTRAVENOUS
  Administered 2018-02-16 (×2): 25 ug via INTRAVENOUS

## 2018-02-16 MED ORDER — EPHEDRINE 5 MG/ML INJ
INTRAVENOUS | Status: AC
  Filled 2018-02-16: qty 10

## 2018-02-16 MED ORDER — PROMETHAZINE HCL 25 MG/ML IJ SOLN: 6.2500 mg | INTRAMUSCULAR | Status: DC | PRN

## 2018-02-16 MED ORDER — MORPHINE SULFATE (PF) 2 MG/ML IV SOLN: 2.0000 mg | INTRAVENOUS | Status: DC | PRN

## 2018-02-16 MED ORDER — LACTATED RINGERS IR SOLN
Status: DC | PRN
  Administered 2018-02-16: 1000 mL

## 2018-02-16 MED ORDER — HYDROCHLOROTHIAZIDE 12.5 MG PO CAPS
12.5000 mg | ORAL_CAPSULE | Freq: Every day | ORAL | Status: DC
  Filled 2018-02-16: qty 1

## 2018-02-16 MED ORDER — ROCURONIUM BROMIDE 100 MG/10ML IV SOLN
INTRAVENOUS | Status: AC
  Filled 2018-02-16: qty 1

## 2018-02-16 MED ORDER — DEXAMETHASONE SODIUM PHOSPHATE 10 MG/ML IJ SOLN
INTRAMUSCULAR | Status: AC
  Filled 2018-02-16: qty 1

## 2018-02-16 MED ORDER — FENTANYL CITRATE (PF) 100 MCG/2ML IJ SOLN
INTRAMUSCULAR | Status: AC
  Filled 2018-02-16: qty 2

## 2018-02-16 MED ORDER — BUPIVACAINE-EPINEPHRINE 0.25% -1:200000 IJ SOLN
INTRAMUSCULAR | Status: DC | PRN
  Administered 2018-02-16: 20 mL

## 2018-02-16 MED ORDER — PHENYLEPHRINE 40 MCG/ML (10ML) SYRINGE FOR IV PUSH (FOR BLOOD PRESSURE SUPPORT)
PREFILLED_SYRINGE | INTRAVENOUS | Status: AC
  Filled 2018-02-16: qty 10

## 2018-02-16 MED ORDER — BISACODYL 10 MG RE SUPP: 10.0000 mg | Freq: Every day | RECTAL | Status: DC | PRN

## 2018-02-16 MED ORDER — SUCCINYLCHOLINE CHLORIDE 200 MG/10ML IV SOSY
PREFILLED_SYRINGE | INTRAVENOUS | Status: AC
  Filled 2018-02-16: qty 10

## 2018-02-16 MED ORDER — PIPERACILLIN-TAZOBACTAM 3.375 G IVPB 30 MIN
3.3750 g | Freq: Once | INTRAVENOUS | Status: AC
  Administered 2018-02-16: 3.375 g via INTRAVENOUS
  Filled 2018-02-16 (×2): qty 50

## 2018-02-16 MED ORDER — SENNOSIDES-DOCUSATE SODIUM 8.6-50 MG PO TABS
1.0000 | ORAL_TABLET | Freq: Every day | ORAL | Status: DC
  Administered 2018-02-16: 1 via ORAL
  Filled 2018-02-16: qty 1

## 2018-02-16 MED ORDER — ONDANSETRON 4 MG PO TBDP: 4.0000 mg | ORAL_TABLET | Freq: Four times a day (QID) | ORAL | Status: DC | PRN

## 2018-02-16 MED ORDER — SUCCINYLCHOLINE CHLORIDE 200 MG/10ML IV SOSY
PREFILLED_SYRINGE | INTRAVENOUS | Status: DC | PRN
  Administered 2018-02-16: 80 mg via INTRAVENOUS

## 2018-02-16 MED ORDER — LACTATED RINGERS IV SOLN
INTRAVENOUS | Status: DC | PRN
  Administered 2018-02-16: 09:00:00 via INTRAVENOUS

## 2018-02-16 MED ORDER — ENOXAPARIN SODIUM 40 MG/0.4ML ~~LOC~~ SOLN
40.0000 mg | SUBCUTANEOUS | Status: DC
  Administered 2018-02-17: 40 mg via SUBCUTANEOUS
  Filled 2018-02-16: qty 0.4

## 2018-02-16 MED ORDER — SUGAMMADEX SODIUM 200 MG/2ML IV SOLN
INTRAVENOUS | Status: DC | PRN
  Administered 2018-02-16: 200 mg via INTRAVENOUS

## 2018-02-16 MED ORDER — 0.9 % SODIUM CHLORIDE (POUR BTL) OPTIME
TOPICAL | Status: DC | PRN
  Administered 2018-02-16: 1000 mL

## 2018-02-16 MED ORDER — PROPOFOL 10 MG/ML IV BOLUS
INTRAVENOUS | Status: AC
  Filled 2018-02-16: qty 20

## 2018-02-16 MED ORDER — SODIUM CHLORIDE 0.9 % IV SOLN
INTRAVENOUS | Status: DC | PRN
  Administered 2018-02-16: 250 mL via INTRAVENOUS

## 2018-02-16 MED ORDER — MEPERIDINE HCL 50 MG/ML IJ SOLN: 6.2500 mg | INTRAMUSCULAR | Status: DC | PRN

## 2018-02-16 MED ORDER — TRAMADOL HCL 50 MG PO TABS: 50.0000 mg | ORAL_TABLET | Freq: Four times a day (QID) | ORAL | Status: DC | PRN

## 2018-02-16 MED ORDER — FENTANYL CITRATE (PF) 100 MCG/2ML IJ SOLN
50.0000 ug | Freq: Once | INTRAMUSCULAR | Status: AC
  Administered 2018-02-16: 50 ug via INTRAVENOUS
  Filled 2018-02-16: qty 2

## 2018-02-16 MED ORDER — ONDANSETRON HCL 4 MG/2ML IJ SOLN
4.0000 mg | Freq: Once | INTRAMUSCULAR | Status: AC
  Administered 2018-02-16: 4 mg via INTRAVENOUS
  Filled 2018-02-16: qty 2

## 2018-02-16 SURGICAL SUPPLY — 43 items
ADH SKN CLS APL DERMABOND .7 (GAUZE/BANDAGES/DRESSINGS) ×1
APPLIER CLIP 5 13 M/L LIGAMAX5 (MISCELLANEOUS)
APPLIER CLIP ROT 10 11.4 M/L (STAPLE)
APR CLP MED LRG 11.4X10 (STAPLE)
APR CLP MED LRG 5 ANG JAW (MISCELLANEOUS)
BAG SPEC RTRVL LRG 6X4 10 (ENDOMECHANICALS) ×1
CABLE HIGH FREQUENCY MONO STRZ (ELECTRODE) ×2 IMPLANT
CHLORAPREP W/TINT 26ML (MISCELLANEOUS) ×2 IMPLANT
CLIP APPLIE 5 13 M/L LIGAMAX5 (MISCELLANEOUS) IMPLANT
CLIP APPLIE ROT 10 11.4 M/L (STAPLE) IMPLANT
DECANTER SPIKE VIAL GLASS SM (MISCELLANEOUS) ×2 IMPLANT
DERMABOND ADVANCED (GAUZE/BANDAGES/DRESSINGS) ×1
DERMABOND ADVANCED .7 DNX12 (GAUZE/BANDAGES/DRESSINGS) ×1 IMPLANT
DRAPE LAPAROSCOPIC ABDOMINAL (DRAPES) IMPLANT
ELECT REM PT RETURN 15FT ADLT (MISCELLANEOUS) ×2 IMPLANT
GLOVE BIO SURGEON STRL SZ 6.5 (GLOVE) ×2 IMPLANT
GLOVE BIOGEL PI IND STRL 7.0 (GLOVE) ×1 IMPLANT
GLOVE BIOGEL PI INDICATOR 7.0 (GLOVE) ×1
GOWN STRL REUS W/TWL 2XL LVL3 (GOWN DISPOSABLE) ×2 IMPLANT
GOWN STRL REUS W/TWL XL LVL3 (GOWN DISPOSABLE) ×2 IMPLANT
GRASPER SUT TROCAR 14GX15 (MISCELLANEOUS) IMPLANT
HANDLE STAPLE EGIA 4 XL (STAPLE) ×1 IMPLANT
IRRIG SUCT STRYKERFLOW 2 WTIP (MISCELLANEOUS) ×2
IRRIGATION SUCT STRKRFLW 2 WTP (MISCELLANEOUS) ×1 IMPLANT
KIT BASIN OR (CUSTOM PROCEDURE TRAY) ×2 IMPLANT
MARKER SKIN DUAL TIP RULER LAB (MISCELLANEOUS) IMPLANT
POUCH SPECIMEN RETRIEVAL 10MM (ENDOMECHANICALS) ×1 IMPLANT
RELOAD EGIA 45 MED/THCK PURPLE (STAPLE) ×2 IMPLANT
RELOAD EGIA 45 TAN VASC (STAPLE) ×1 IMPLANT
RELOAD EGIA 60 MED/THCK PURPLE (STAPLE) IMPLANT
RELOAD EGIA 60 TAN VASC (STAPLE) IMPLANT
RELOAD STAPLE 60 MED/THCK ART (STAPLE) IMPLANT
SCISSORS LAP 5X35 DISP (ENDOMECHANICALS) ×1 IMPLANT
SLEEVE XCEL OPT CAN 5 100 (ENDOMECHANICALS) ×1 IMPLANT
SUT VIC AB 2-0 SH 27 (SUTURE) ×2
SUT VIC AB 2-0 SH 27X BRD (SUTURE) IMPLANT
SUT VIC AB 4-0 PS2 27 (SUTURE) ×2 IMPLANT
SUT VICRYL 0 UR6 27IN ABS (SUTURE) IMPLANT
TOWEL OR 17X26 10 PK STRL BLUE (TOWEL DISPOSABLE) ×2 IMPLANT
TRAY FOLEY MTR SLVR 16FR STAT (SET/KITS/TRAYS/PACK) ×2 IMPLANT
TRAY LAPAROSCOPIC (CUSTOM PROCEDURE TRAY) ×2 IMPLANT
TROCAR BLADELESS OPT 5 100 (ENDOMECHANICALS) ×1 IMPLANT
TROCAR XCEL BLUNT TIP 100MML (ENDOMECHANICALS) ×2 IMPLANT

## 2018-02-16 NOTE — H&P (Signed)
Tonya [name] is an 84 y.o. female.   Chief Complaint: RLQ pain HPI: 84 y.o. F who c/o intermittent RLQ pain chronically who developed severe pain over the last 24 hrs.  She denies fevers, nausea, vomiting and diarrhea.  PMH sig for Htn and anxiety.  PSH consists of open tubal ligation, vaginal hysterectomy and lap chole.      Past Medical History:  Diagnosis Date  . Anxiety   . Cystocele   . Degenerative disc disease   . Hypertension   . IBS (irritable bowel syndrome)   . Rectocele     Past Surgical History:  Procedure Laterality Date  . ABDOMINAL HYSTERECTOMY     partial  . CHOLECYSTECTOMY      Family History  Problem Relation Age of Onset  . Depression Unknown   . GER disease Son   . Depression Son    Social History:  reports that she has never smoked. She has never used smokeless tobacco. She reports that she does not drink alcohol or use drugs.  Allergies:  Allergies  Allergen Reactions  . Amlodipine Other (See Comments)    Ankle swelling.   . Codeine     Other reaction(s): Unknown  . Tramadol Nausea Only     (Not in a hospital admission)  Results for orders placed or performed during the hospital encounter of 02/16/18 (from the past 48 hour(s))  Comprehensive metabolic panel     Status: Abnormal   Collection Time: 02/16/18 12:42 AM  Result Value Ref Range   Sodium 140 135 - 145 mmol/L   Potassium 3.0 (L) 3.5 - 5.1 mmol/L   Chloride 100 98 - 111 mmol/L   CO2 28 22 - 32 mmol/L   Glucose, Bld 128 (H) 70 - 99 mg/dL   BUN 19 8 - 23 mg/dL   Creatinine, Ser 0.98 0.44 - 1.00 mg/dL   Calcium 8.3 (L) 8.9 - 10.3 mg/dL   Total Protein 6.4 (L) 6.5 - 8.1 g/dL   Albumin 3.6 3.5 - 5.0 g/dL   AST 18 15 - 41 U/L   ALT 12 0 - 44 U/L   Alkaline Phosphatase 63 38 - 126 U/L   Total Bilirubin 1.7 (H) 0.3 - 1.2 mg/dL   GFR calc non Af Amer 50 (L) >60 mL/min   GFR calc Af Amer 58 (L) >60 mL/min    Comment: (NOTE) The eGFR has been calculated using the CKD EPI  equation. This calculation has not been validated in all clinical situations. eGFR's persistently <60 mL/min signify possible Chronic Kidney Disease.    Anion gap 12 5 - 15    Comment: Performed at [facility], [address] [place], [place] [address]  Lipase, blood     Status: None   Collection Time: 02/16/18 12:42 AM  Result Value Ref Range   Lipase 30 11 - 51 U/L    Comment: Performed at [facility], [address] [place], [place] [address]  CBC with Differential/Platelet     Status: Abnormal   Collection Time: 02/16/18 12:42 AM  Result Value Ref Range   WBC 10.4 4.0 - 10.5 K/uL   RBC 4.45 3.87 - 5.11 MIL/uL   Hemoglobin 13.6 12.0 - 15.0 g/dL   HCT 40.0 36.0 - 46.0 %   MCV 89.9 78.0 - 100.0 fL   MCH 30.6 26.0 - 34.0 pg   MCHC 34.0 30.0 - 36.0 g/dL   RDW 14.2 11.5 - 15.5 %  Platelets 198 150 - 400 K/uL   Neutrophils Relative % 79 %   Lymphocytes Relative 6 %   Monocytes Relative 14 %   Eosinophils Relative 1 %   Basophils Relative 0 %   Neutro Abs 8.2 (H) 1.7 - 7.7 K/uL   Lymphs Abs 0.6 (L) 0.7 - 4.0 K/uL   Monocytes Absolute 1.5 (H) 0.1 - 1.0 K/uL   Eosinophils Absolute 0.1 0.0 - 0.7 K/uL   Basophils Absolute 0.0 0.0 - 0.1 K/uL   WBC Morphology MILD LEFT SHIFT (1-5% METAS, OCC MYELO, OCC BANDS)     Comment: Performed at [facility], [address] [place], [place] [address]  Urinalysis, Routine w reflex microscopic     Status: Abnormal   Collection Time: 02/16/18  1:30 AM  Result Value Ref Range   Color, Urine YELLOW YELLOW   APPearance CLOUDY (A) CLEAR   Specific Gravity, Urine <1.005 (L) 1.005 - 1.030   pH 6.0 5.0 - 8.0   Glucose, UA NEGATIVE NEGATIVE mg/dL   Hgb urine dipstick SMALL (A) NEGATIVE   Bilirubin Urine NEGATIVE NEGATIVE   Ketones, ur NEGATIVE NEGATIVE mg/dL   Protein, ur NEGATIVE NEGATIVE mg/dL   Nitrite NEGATIVE NEGATIVE   Leukocytes, UA MODERATE (A) NEGATIVE    Comment: Performed at [facility],  [address] [place], [place] [address]  Urinalysis, Microscopic (reflex)     Status: Abnormal   Collection Time: 02/16/18  1:30 AM  Result Value Ref Range   RBC / HPF 0-5 0 - 5 RBC/hpf   WBC, UA 11-20 0 - 5 WBC/hpf   Bacteria, UA MANY (A) NONE SEEN   Squamous Epithelial / LPF 0-5 0 - 5    Comment: Performed at [facility], [address] [place], [place] [address]   Ct Abdomen Pelvis W Contrast  Result Date: 02/16/2018 CLINICAL DATA:  84 year old female with abdominal pain. EXAM: CT ABDOMEN AND PELVIS WITH CONTRAST TECHNIQUE: Multidetector CT imaging of the abdomen and pelvis was performed using the standard protocol following bolus administration of intravenous contrast. CONTRAST:  176m ISOVUE-300 IOPAMIDOL (ISOVUE-300) INJECTION 61% COMPARISON:  Chest radiograph dated 10/15/2017 and lumbar spine radiograph dated 07/05/2014 and CT of the abdomen pelvis dated 05/31/2008 FINDINGS: Lower chest: Atelectatic changes of the left lung base secondary to large hiatal hernia. The right lung base is clear as visualized. There is Lori vascular calcification. No intra-abdominal free air or free fluid. Hepatobiliary: Cholecystectomy. Mild intrahepatic biliary ductal dilatation. The liver is unremarkable. Pancreas: There is a 5 mm hypodense focus in the distal body of the pancreas grossly stable compared to the study of 2009 likely a benign or indolent etiology such as a side branch IPMN. The pancreas is otherwise unremarkable. Spleen: Normal in size without focal abnormality. Adrenals/Urinary Tract: The adrenal glands are unremarkable. The kidneys, visualized ureters, and urinary bladder appear unremarkable. There is symmetric enhancement and excretion of contrast by both kidneys. Stomach/Bowel: There is extensive sigmoid diverticulosis without active inflammatory changes. No bowel obstruction. There is a large hiatal hernia containing the stomach and a segment of the transverse colon. There is a 3  cm distal duodenal diverticulum and a 2 cm posterior diverticulum of the proximal duodenum. There are scattered small bowel diverticulosis. The appendix is enlarged and inflamed measuring up to 16 mm in diameter. There is a 15 mm stone in the distal lumen of the appendix or within a locally contained perforation or abscess adjacent to  the tip of the appendix. Additional 2 mm stone is also noted which may be within a contained perforation/collection adjacent to the tip of the appendix. The appendix is located in the right lower quadrant medial to the cecum extending inferior and medially into the right hemipelvis. Vascular/Lymphatic: Advanced aortoiliac atherosclerotic disease. No portal venous gas. There is no adenopathy. Reproductive: Hysterectomy.  The ovaries are grossly unremarkable. Other: Small fat containing inguinal hernias. Musculoskeletal: Degenerative changes and disc desiccation at L5-S1. Advanced osteopenia. Age indeterminate probable old fracture of the S1-S2. Clinical correlation is recommended. IMPRESSION: 1. Acute appendicitis. Multiple appendicoliths in the distal lumen of the appendix or within a focally contained collection adjacent to the tip of the appendix. 2. Extensive colonic diverticulosis as well as scattered small bowel diverticula. No associated inflammatory changes. No bowel obstruction. 3. Age indeterminate fracture of the S1-S2. Clinical correlation is recommended. 4. A 5 mm distal pancreatic hypodense lesion grossly similar to the study of 2009, likely a side branch IPMN. 5.  Aortic Atherosclerosis (ICD10-I70.0). Electronically Signed   By: Anner Crete M.D.   On: 02/16/2018 03:13    Review of Systems  Constitutional: Negative for chills and fever.  HENT: Negative for hearing loss.   Eyes: Negative for blurred vision and double vision.  Cardiovascular: Negative for chest pain and palpitations.  Gastrointestinal: Positive for abdominal pain. Negative for constipation,  diarrhea, nausea and vomiting.  Genitourinary: Negative for dysuria, frequency and urgency.  Neurological: Negative for dizziness and headaches.    Blood pressure 138/60, pulse 73, temperature 98.8 F (37.1 C), temperature source Oral, resp. rate 17, height '5\' 7"'  (1.702 m), weight 74.8 kg, SpO2 96 %. Physical Exam  Constitutional: She is oriented to person, place, and time. She appears well-developed and well-nourished.  HENT:  Head: Normocephalic and atraumatic.  Eyes: Pupils are equal, round, and reactive to light. Conjunctivae are normal.  Neck: Normal range of motion. Neck supple.  Cardiovascular: Normal rate and regular rhythm.  Respiratory: Effort normal. No respiratory distress.  GI: Soft. There is tenderness. There is no rebound and no guarding.  Musculoskeletal: Normal range of motion.  Neurological: She is alert and oriented to person, place, and time.  Skin: Skin is warm and dry.     Assessment/Plan 84 y.o. F with acute appendicitis and large appendicolith. I have recommended lap appendectomy.  Risks include bleeding, infection, damage to adjacent structures, staple line leak and hernia.  I believe she understands this and wishes to proceed.  Rosario Adie., MD 12/23/6752, 6:56 AM

## 2018-02-16 NOTE — ED Notes (Signed)
Pt ambulated to the restroom with no difficulty, minimal staff assistance

## 2018-02-16 NOTE — Anesthesia Procedure Notes (Signed)
Procedure Name: Intubation Date/Time: 02/16/2018 9:06 AM Performed by: Vanessa [place], CRNA Pre-anesthesia Checklist: Patient identified, Emergency Drugs available, Suction available and Patient being monitored Patient Re-evaluated:Patient Re-evaluated prior to induction Oxygen Delivery Method: Circle system utilized Preoxygenation: Pre-oxygenation with 100% oxygen Induction Type: IV induction Ventilation: Mask ventilation without difficulty Laryngoscope Size: 2 and Miller Grade View: Grade I Tube type: Oral Tube size: 7.0 mm Number of attempts: 1 Airway Equipment and Method: Stylet Placement Confirmation: ETT inserted through vocal cords under direct vision,  positive ETCO2 and breath sounds checked- equal and bilateral Secured at: 21 cm Tube secured with: Tape Dental Injury: Teeth and Oropharynx as per pre-operative assessment

## 2018-02-16 NOTE — Transfer of Care (Signed)
Immediate Anesthesia Transfer of Care Note  Patient: Tonya [name]  Procedure(s) Performed: APPENDECTOMY LAPAROSCOPIC (N/A Abdomen)  Patient Location: PACU  Anesthesia Type:General  Level of Consciousness: awake and patient cooperative  Airway & Oxygen Therapy: Patient Spontanous Breathing and Patient connected to face mask  Post-op Assessment: Report given to RN and Post -op Vital signs reviewed and stable  Post vital signs: Reviewed and stable  Last Vitals:  Vitals Value Taken Time  BP    Temp    Pulse 93 02/16/2018 10:09 AM  Resp 14 02/16/2018 10:09 AM  SpO2 94 % 02/16/2018 10:09 AM  Vitals shown include unvalidated device data.  Last Pain:  Vitals:   02/16/18 0704  TempSrc:   PainSc: 2          Complications: No apparent anesthesia complications

## 2018-02-16 NOTE — ED Provider Notes (Signed)
[facility] EMERGENCY DEPARTMENT Provider Note   CSN: [id number] Arrival date & time: 02/15/18  2352     History   Chief Complaint Chief Complaint  Patient presents with  . Abdominal Pain    HPI Tonya [name] is a 84 y.o. female.  The history is provided by the patient.  Abdominal Pain   This is a new problem. The current episode started more than 2 days ago. The problem occurs daily. The problem has been gradually worsening. The pain is located in the RLQ. The pain is moderate. Associated symptoms include myalgias. Pertinent negatives include fever, diarrhea, hematochezia, melena, nausea, constipation and dysuria. The symptoms are aggravated by certain positions and palpation. Nothing relieves the symptoms.  Patient with history of hypertension and irritable bowel syndrome presents with abdominal pain.  She reports increasing right lower quadrant abdominal pain for the past 2 days. She has distant history of hysterectomy, has also had a cholecystectomy.  Past Medical History:  Diagnosis Date  . Anxiety   . Cystocele   . Degenerative disc disease   . Hypertension   . IBS (irritable bowel syndrome)   . Rectocele     Patient Active Problem List   Diagnosis Date Noted  . Hammer toes of both feet 07/26/2017  . Metatarsalgia of both feet 07/26/2017  . Dependent edema 07/26/2017  . Gastroesophageal reflux disease with esophagitis 05/01/2016  . Medial meniscus tear 09/21/2015  . Hernia, hiatal 06/17/2015  . SCIATICA 05/04/2010  . ALLERGIC RHINITIS CAUSE UNSPECIFIED 10/01/2008  . IBS 06/30/2008  . CYST AND PSEUDOCYST OF PANCREAS 05/31/2008  . SHOULDER PAIN, RIGHT 01/14/2007  . Osteoporosis 01/14/2007  . POSITIONAL VERTIGO 09/04/2006  . GERD 07/12/2006  . ANXIETY DISORDER, GENERALIZED 05/07/2006  . HYPERTENSION, BENIGN 05/07/2006    Past Surgical History:  Procedure Laterality Date  . ABDOMINAL HYSTERECTOMY     partial  . CHOLECYSTECTOMY       OB  History   None      Home Medications    Prior to Admission medications   Medication Sig Start Date End Date Taking? Authorizing Provider  ALPRAZolam (XANAX) 0.25 MG tablet Take 1 tablet (0.25 mg total) by mouth daily as needed for anxiety. This is 30 days supply. Ok to fill 03/12/18 01/28/18   Agapito Games, MD  AMBULATORY NON FORMULARY MEDICATION Rollator with seat. Dx right hip pain M25.551 [facility] [phone] 01/31/18   Agapito Games, MD  diclofenac sodium (VOLTAREN) 1 % GEL Apply 2 g topically 4 (four) times daily. To affected joint. 07/26/17   Rodolph Bong, MD  escitalopram (LEXAPRO) 10 MG tablet TAKE 1 TABLET(10 MG) BY MOUTH DAILY 08/28/17   Agapito Games, MD  esomeprazole (NEXIUM) 40 MG capsule TAKE ONE CAPSULE BY MOUTH DAILY AT NOON 08/28/17   Agapito Games, MD  hydrochlorothiazide (MICROZIDE) 12.5 MG capsule TAKE 1 CAPSULE(12.5 MG) BY MOUTH DAILY 08/16/17   Agapito Games, MD    Family History Family History  Problem Relation Age of Onset  . Depression Unknown   . GER disease Son   . Depression Son     Social History Social History   Tobacco Use  . Smoking status: Never Smoker  . Smokeless tobacco: Never Used  Substance Use Topics  . Alcohol use: No  . Drug use: No     Allergies   Amlodipine; Codeine; and Tramadol   Review of Systems Review of Systems  Constitutional: Negative for fever.  Cardiovascular:  Negative for chest pain.  Gastrointestinal: Positive for abdominal pain. Negative for constipation, diarrhea, hematochezia, melena and nausea.  Genitourinary: Negative for dysuria.  Musculoskeletal: Positive for myalgias.  All other systems reviewed and are negative.    Physical Exam Updated Vital Signs BP 138/60 (BP Location: Right Arm)   Pulse 73   Temp 98.8 F (37.1 C) (Oral)   Resp 17   Ht 1.702 m (5\' 7" )   Wt 74.8 kg   SpO2 96%   BMI 25.84 kg/m   Physical Exam  CONSTITUTIONAL: Elderly, no acute  distress HEAD: Normocephalic/atraumatic EYES: EOMI/PERRL ENMT: Mucous membranes moist NECK: supple no meningeal signs SPINE/BACK:entire spine nontender CV: S1/S2 noted, no murmurs/rubs/gallops noted LUNGS: Lungs are clear to auscultation bilaterally, no apparent distress ABDOMEN: soft, moderate RLQ tenderness, no rebound or guarding, bowel sounds noted throughout abdomen GU:no cva tenderness NEURO: Pt is awake/alert/appropriate, moves all extremitiesx4.  No facial droop.   EXTREMITIES: pulses normal/equal, full ROM SKIN: warm, color normal PSYCH: no abnormalities of mood noted, alert and oriented to situation   ED Treatments / Results  Labs (all labs ordered are listed, but only abnormal results are displayed) Labs Reviewed  COMPREHENSIVE METABOLIC PANEL - Abnormal; Notable for the following components:      Result Value   Potassium 3.0 (*)    Glucose, Bld 128 (*)    Calcium 8.3 (*)    Total Protein 6.4 (*)    Total Bilirubin 1.7 (*)    GFR calc non Af Amer 50 (*)    GFR calc Af Amer 58 (*)    All other components within normal limits  CBC WITH DIFFERENTIAL/PLATELET - Abnormal; Notable for the following components:   Neutro Abs 8.2 (*)    Lymphs Abs 0.6 (*)    Monocytes Absolute 1.5 (*)    All other components within normal limits  URINALYSIS, ROUTINE W REFLEX MICROSCOPIC - Abnormal; Notable for the following components:   APPearance CLOUDY (*)    Specific Gravity, Urine <1.005 (*)    Hgb urine dipstick SMALL (*)    Leukocytes, UA MODERATE (*)    All other components within normal limits  URINALYSIS, MICROSCOPIC (REFLEX) - Abnormal; Notable for the following components:   Bacteria, UA MANY (*)    All other components within normal limits  LIPASE, BLOOD    EKG EKG Interpretation  Date/Time:  Sunday February 16 2018 00:58:52 EDT Ventricular Rate:  74 PR Interval:    QRS Duration: 108 QT Interval:  383 QTC Calculation: 425 R Axis:   91 Text Interpretation:   Sinus rhythm Multiform ventricular premature complexes Borderline short PR interval Right axis deviation Low voltage, extremity and precordial leads Anteroseptal infarct, old Confirmed by Zadie Rhine ([id number]) on 02/16/2018 1:05:02 AM   Radiology Ct Abdomen Pelvis W Contrast  Result Date: 02/16/2018 CLINICAL DATA:  84 year old female with abdominal pain. EXAM: CT ABDOMEN AND PELVIS WITH CONTRAST TECHNIQUE: Multidetector CT imaging of the abdomen and pelvis was performed using the standard protocol following bolus administration of intravenous contrast. CONTRAST:  ISOVUE-300 IOPAMIDOL (ISOVUE-300) INJECTION 61% COMPARISON:  Chest radiograph dated 10/15/2017 and lumbar spine radiograph dated 07/05/2014 and CT of the abdomen pelvis dated 05/31/2008 FINDINGS: Lower chest: Atelectatic changes of the left lung base secondary to large hiatal hernia. The right lung base is clear as visualized. There is Lori vascular calcification. No intra-abdominal free air or free fluid. Hepatobiliary: Cholecystectomy. Mild intrahepatic biliary ductal dilatation. The liver is unremarkable. Pancreas: There is a 5 mm  hypodense focus in the distal body of the pancreas grossly stable compared to the study of 2009 likely a benign or indolent etiology such as a side branch IPMN. The pancreas is otherwise unremarkable. Spleen: Normal in size without focal abnormality. Adrenals/Urinary Tract: The adrenal glands are unremarkable. The kidneys, visualized ureters, and urinary bladder appear unremarkable. There is symmetric enhancement and excretion of contrast by both kidneys. Stomach/Bowel: There is extensive sigmoid diverticulosis without active inflammatory changes. No bowel obstruction. There is a large hiatal hernia containing the stomach and a segment of the transverse colon. There is a 3 cm distal duodenal diverticulum and a 2 cm posterior diverticulum of the proximal duodenum. There are scattered small bowel diverticulosis. The  appendix is enlarged and inflamed measuring up to 16 mm in diameter. There is a 15 mm stone in the distal lumen of the appendix or within a locally contained perforation or abscess adjacent to the tip of the appendix. Additional 2 mm stone is also noted which may be within a contained perforation/collection adjacent to the tip of the appendix. The appendix is located in the right lower quadrant medial to the cecum extending inferior and medially into the right hemipelvis. Vascular/Lymphatic: Advanced aortoiliac atherosclerotic disease. No portal venous gas. There is no adenopathy. Reproductive: Hysterectomy.  The ovaries are grossly unremarkable. Other: Small fat containing inguinal hernias. Musculoskeletal: Degenerative changes and disc desiccation at L5-S1. Advanced osteopenia. Age indeterminate probable old fracture of the S1-S2. Clinical correlation is recommended. IMPRESSION: 1. Acute appendicitis. Multiple appendicoliths in the distal lumen of the appendix or within a focally contained collection adjacent to the tip of the appendix. 2. Extensive colonic diverticulosis as well as scattered small bowel diverticula. No associated inflammatory changes. No bowel obstruction. 3. Age indeterminate fracture of the S1-S2. Clinical correlation is recommended. 4. A 5 mm distal pancreatic hypodense lesion grossly similar to the study of 2009, likely a side branch IPMN. 5.  Aortic Atherosclerosis (ICD10-I70.0). Electronically Signed   By: Elgie Collard M.D.   On: 02/16/2018 03:13    Procedures Procedures  Medications Ordered in ED Medications  piperacillin-tazobactam (ZOSYN) IVPB 3.375 g (has no administration in time range)  0.9 %  sodium chloride infusion (has no administration in time range)  fentaNYL (SUBLIMAZE) injection 25 mcg (25 mcg Intravenous Given 02/16/18 0221)  iopamidol (ISOVUE-300) 61 % injection 100 mL (100 mLs Intravenous Contrast Given 02/16/18 0226)     Initial Impression / Assessment and  Plan / ED Course  I have reviewed the triage vital signs and the nursing notes.  Pertinent labs results that were available during my care of the patient were reviewed by me and considered in my medical decision making (see chart for details).    3:35 AM  Found to have acute appendicitis by CT imaging.  There is no perforation. Discussed the case with Dr. Maisie Fus with general surgery.  She will accept patient in transfer to [facility].  Discussed the case with Dr. Blinda Leatherwood at [facility] ER.  Patient has been given Zosyn. PT Updated on plan.  Final Clinical Impressions(s) / ED Diagnoses   Final diagnoses:  Other acute appendicitis    ED Discharge Orders    None       Zadie Rhine, MD 02/16/18 [phone]

## 2018-02-16 NOTE — Anesthesia Preprocedure Evaluation (Signed)
Anesthesia Evaluation  Patient identified by MRN, date of birth, ID band Patient awake    Reviewed: Allergy & Precautions, NPO status , Patient's Chart, lab work & pertinent test results  Airway Mallampati: II  TM Distance: >3 FB Neck ROM: Full    Dental no notable dental hx.    Pulmonary neg pulmonary ROS,    Pulmonary exam normal breath sounds clear to auscultation       Cardiovascular hypertension, Normal cardiovascular exam Rhythm:Regular Rate:Normal     Neuro/Psych PSYCHIATRIC DISORDERS Anxiety negative neurological ROS     GI/Hepatic Neg liver ROS, hiatal hernia, GERD  ,  Endo/Other  negative endocrine ROS  Renal/GU negative Renal ROS     Musculoskeletal negative musculoskeletal ROS (+) Arthritis ,   Abdominal   Peds  Hematology negative hematology ROS (+)   Anesthesia Other Findings   Reproductive/Obstetrics                             Anesthesia Physical Anesthesia Plan  ASA: III  Anesthesia Plan: General   Post-op Pain Management:    Induction: Intravenous and Rapid sequence  PONV Risk Score and Plan: 4 or greater and Ondansetron, Dexamethasone and Treatment may vary due to age or medical condition  Airway Management Planned: Oral ETT  Additional Equipment:   Intra-op Plan:   Post-operative Plan: Extubation in OR  Informed Consent: I have reviewed the patients History and Physical, chart, labs and discussed the procedure including the risks, benefits and alternatives for the proposed anesthesia with the patient or authorized representative who has indicated his/her understanding and acceptance.   Dental advisory given  Plan Discussed with: CRNA  Anesthesia Plan Comments:         Anesthesia Quick Evaluation

## 2018-02-16 NOTE — Op Note (Signed)
[name] Tonya [name] [id number]   PRE-OPERATIVE DIAGNOSIS:  appendicitis  POST-OPERATIVE DIAGNOSIS:  appendicitis   APPENDECTOMY LAPAROSCOPIC   Surgeon(s): Romie Levee, MD  ASSISTANT: none   ANESTHESIA:   local and general  EBL:   30ml  Delay start of Pharmacological VTE agent (>24hrs) due to surgical blood loss or risk of bleeding:  no  DRAINS: none   SPECIMEN:  Source of Specimen:  appendix  DISPOSITION OF SPECIMEN:  PATHOLOGY  COUNTS:  YES  PLAN OF CARE: Pt admitted  PATIENT DISPOSITION:  PACU - hemodynamically stable.   INDICATIONS: Patient with concerning symptoms & work up suspicious for appendicitis.  Surgery was recommended:  The anatomy & physiology of the digestive tract was discussed.  The pathophysiology of appendicitis was discussed.  Natural history risks without surgery was discussed.   I feel the risks of no intervention will lead to serious problems that outweigh the operative risks; therefore, I recommended diagnostic laparoscopy with removal of appendix to remove the pathology.  Laparoscopic & open techniques were discussed.   I noted a good likelihood this will help address the problem.    Risks such as bleeding, infection, abscess, leak, reoperation, possible ostomy, hernia, heart attack, death, and other risks were discussed.  Goals of post-operative recovery were discussed as well.  We will work to minimize complications.  Questions were answered.  The patient expresses understanding & wishes to proceed with surgery.  OR FINDINGS: acute appendicitis with focal perforation  DESCRIPTION:   The patient was identified & brought into the operating room. The patient was positioned supine with left arm tucked. SCDs were active during the entire case. The patient underwent general anesthesia without any difficulty.  A foley catheter was inserted under sterile conditions. The abdomen was prepped and draped in a sterile fashion. A Surgical Timeout confirmed our  plan.   I made a transverse incision through the inferior umbilical fold.  I made a nick in the infraumbilical fascia and confirmed peritoneal entry.  I placed a stay suture and then the [facility] port.  We induced carbon dioxide insufflation.  Camera inspection revealed no injury.  I placed additional ports under direct laparoscopic visualization.  I mobilized the terminal ileum to proximal ascending colon in a lateral to medial fashion.  I took care to avoid injuring any retroperitoneal structures.   I freed the appendix off its attachments to the ascending colon and cecal mesentery.  I elevated the appendix.  I was able to free off the base of the appendix, which was still viable.  I stapled the appendix off the cecum using a laparoscopic purple load 45mm Covidian stapler x2 loads.  I took a healthy cuff viable cecum. I skeletonized & ligated the mesoappendix with a vascular load stapler.  I placed the appendix inside an EndoCatch bag and removed out the [place] port.  I did copious irrigation. Hemostasis was good in the mesoappendix, colon mesentery, and retroperitoneum. Staple line was intact on the cecum with no bleeding. I washed out the pelvis, retrohepatic space and right paracolic gutter.  Hemostasis is good. There was no perforation or injury.  Because the area cleaned up well after irrigation, I did not place a drain.  I aspirated the carbon dioxide. I removed the ports. I closed the umbilical fascia site using a 0 Vicryl stitch. I closed skin using 4-0 vicryl stitch.  Sterile dressings were applied.  Patient was extubated and sent to the recovery room.  I discussed the operative findings with the  patient's family. I suspect the patient is going used in the hospital at least overnight and will need antibiotics for 1 days. Questions answered. They expressed understanding and appreciation.

## 2018-02-16 NOTE — ED Provider Notes (Signed)
Patient received in transfer from outside facility for acute appendicitis and awaiting for general surgery consultation.  Patient hemodynamic stable this time.  She reports pain to her right lower quadrant but states that the pain medication she was given prior to EMS arrival has helped.  She does not want Emokpae medicine right now.  Patient denies any fevers or chills.  General surgeon Dr. Maisie Fus has been paged to state that patient is here in the ED.   Rise Mu, PA-C 02/16/18 0505    Gilda Crease, MD 02/16/18 2350

## 2018-02-16 NOTE — ED Triage Notes (Signed)
PT presents with c/o right sided abdominal pain for a few days but worse tonight. PT reports nausea and no BM today but pain to right side when straining today.

## 2018-02-17 DIAGNOSIS — K3580 Unspecified acute appendicitis: Secondary | ICD-10-CM | POA: Diagnosis not present

## 2018-02-17 MED ORDER — OXYCODONE HCL 5 MG PO TABS: 2.5000 mg | ORAL_TABLET | ORAL | 0 refills | Status: DC | PRN

## 2018-02-17 NOTE — Anesthesia Postprocedure Evaluation (Signed)
Anesthesia Post Note  Patient: Tonya [name]  Procedure(s) Performed: APPENDECTOMY LAPAROSCOPIC (N/A Abdomen)     Patient location during evaluation: PACU Anesthesia Type: General Level of consciousness: sedated and patient cooperative Pain management: pain level controlled Vital Signs Assessment: post-procedure vital signs reviewed and stable Respiratory status: spontaneous breathing Cardiovascular status: stable Anesthetic complications: no    Last Vitals:  Vitals:   02/17/18 0216 02/17/18 0552  BP: (!) 98/49 (!) 99/51  Pulse: (!) 58 (!) 57  Resp: 16 16  Temp: (!) 36.3 C 36.7 C  SpO2: 93% 94%    Last Pain:  Vitals:   02/17/18 0424  TempSrc:   PainSc: Asleep                 Lewie Loron

## 2018-02-17 NOTE — Discharge Instructions (Signed)
Laparoscopic Appendectomy, Adult A laparoscopic appendectomy is a surgery to take out the appendix. The appendix is a finger-like structure that is attached to the large intestine. In this surgery, the appendix is removed through two or three small cuts (incisions) with the help of a thin, lighted tube that has a tiny camera on the end (laparoscope). A laparoscopic appendectomy may be done to prevent an inflamed appendix from bursting (rupturing). Or, it may be done to treat the infection from an appendix that has already ruptured. It is usually done immediately after inflammation of the appendix (appendicitis) is diagnosed. Tell a health care provider about:  Any allergies you have.  All medicines you are taking, including vitamins, herbs, eye drops, creams, and over-the-counter medicines.  Any problems you or family members have had with anesthetic medicines.  Any blood disorders you have.  Any surgeries you have had.  Any medical conditions you have.  Whether you are pregnant or may be pregnant. What are the risks? Generally, this is a safe procedure. However, problems may occur, including:  Infection.  Bleeding.  Allergic reactions to medicines.  Damage to other structures or organs.  The formation of collections of pus (abscesses).  Long-lasting pain or scarring at the incision sites or inside the abdomen.  Blood clots in the legs.  What happens before the procedure?  Follow instructions from your health care provider about eating or drinking restrictions.  Ask your health care provider about: ? Changing or stopping your regular medicines. This is especially important if you are taking diabetes medicines or blood thinners. ? Taking medicines such as aspirin and ibuprofen. These medicines can thin your blood. Do not take these medicines before your procedure if your health care provider instructs you not to.  Ask your health care provider how your surgical site will be  marked or identified.  You may be given antibiotic medicine to help prevent infection or to treat existing inflammation or infection.  If you will be going home on the same day as your surgery, plan to have someone with you for 24 hours. What happens during the procedure?  To reduce your risk of infection: ? Your health care team will wash or sanitize their hands. ? Your skin will be washed with soap.  An IV tube will be inserted into one of your veins. You will receive medicine and fluids through this tube.  You will be given one or more of the following: ? A medicine to help you relax (sedative). ? A medicine to numb the area (local anesthetic). ? A medicine to make you fall asleep (general anesthetic). ? A medicine that is injected into your spine to numb the area below and slightly above the injection site (spinal anesthetic). ? A medicine that is injected into an area of your body to numb everything below the injection site (regional anesthetic).  A thin, flexible tube (catheter) may be put into your bladder to drain urine.  A tube may be passed through your nose and into your stomach (NG tube, or nasogastric tube) to drain any stomach contents.  Your surgeon will make two or three small incisions near your belly button (navel).  Air-like gas will be used to fill your abdomen. The gas will make your abdomen expand. This helps the surgeon to see clearly and gives him or her more room to work.  A laparoscope will be passed through one of the incisions.  Other long, thin surgical instruments will be passed through the other   incisions.  The appendix will be located and removed through one of the incisions.  The abdomen may be washed out to remove bacteria.  The incisions will be closed with stitches (sutures), staples, or adhesive strips.  A bandage (dressing) may be used to cover the incisions.  If a tube was inserted into your bladder or stomach, it will be removed. What  happens after the procedure?  Your blood pressure, heart rate, breathing rate, and blood oxygen level will be monitored often until the medicines you were given have worn off.  You will be given pain medicine as needed to keep you comfortable.  If your appendix did not rupture, you may be able to go home the same day as your surgery.  Do not drive for 24 hours if you received a sedative.  If your appendix ruptured: ? You will get antibiotic medicine through an IV tube. ? You may be sent home with a temporary drain. This information is not intended to replace advice given to you by your health care provider. Make sure you discuss any questions you have with your health care provider. Document Released: 01/17/2004 Document Revised: 11/10/2015 Document Reviewed: 11/22/2014 Elsevier Interactive Patient Education  2018 Elsevier Inc.  

## 2018-02-17 NOTE — Discharge Summary (Signed)
Physician Discharge Summary  Patient ID: Tonya [name] MRN: [id number] DOB/AGE: [birth year] 84 y.o.  PCP: Agapito Games, MD  Admit date: 02/16/2018 Discharge date: 02/17/2018  Admission Diagnoses:  Acute appendicitis  Discharge Diagnoses:  appendicitis  Active Problems:   Acute appendicitis   Surgery:  Lap appendectomy  Discharged Condition: improved  Hospital Course:   Had surgery on Sunday and ready for discharge on Monday.  Wounds OK  Consults: none  Significant Diagnostic Studies: none    Discharge Exam: Blood pressure (!) 99/51, pulse (!) 57, temperature 98 F (36.7 C), resp. rate 16, height 5\' 7"  (1.702 m), weight 74.8 kg, SpO2 94 %. Incisions bland  Disposition: Discharge disposition: 01-Home or Self Care       Discharge Instructions    Call MD for:  redness, tenderness, or signs of infection (pain, swelling, redness, odor or green/yellow discharge around incision site)   Complete by:  As directed    Diet - low sodium heart healthy   Complete by:  As directed    Discharge instructions   Complete by:  As directed    May shower ad lib Be up and about Advance diet as tolerated   Increase activity slowly   Complete by:  As directed      Allergies as of 02/17/2018      Reactions   Amlodipine Other (See Comments)   Ankle swelling.    Codeine    Other reaction(s): Unknown   Tramadol Nausea Only      Medication List    TAKE these medications   ALPRAZolam 0.25 MG tablet Commonly known as:  XANAX Take 1 tablet (0.25 mg total) by mouth daily as needed for anxiety. This is 30 days supply. Ok to fill 03/12/18   AMBULATORY NON FORMULARY MEDICATION Rollator with seat. Dx right hip pain M25.551 [facility] [phone]   diclofenac sodium 1 % Gel Commonly known as:  VOLTAREN Apply 2 g topically 4 (four) times daily. To affected joint. What changed:    when to take this  reasons to take this   escitalopram 10 MG tablet Commonly known  as:  LEXAPRO TAKE 1 TABLET(10 MG) BY MOUTH DAILY What changed:  See the new instructions.   esomeprazole 40 MG capsule Commonly known as:  NEXIUM TAKE ONE CAPSULE BY MOUTH DAILY AT NOON What changed:  See the new instructions.   hydrochlorothiazide 12.5 MG capsule Commonly known as:  MICROZIDE TAKE 1 CAPSULE(12.5 MG) BY MOUTH DAILY What changed:  See the new instructions.   oxyCODONE 5 MG immediate release tablet Commonly known as:  Oxy IR/ROXICODONE Take 0.5-1 tablets (2.5-5 mg total) by mouth every 3 (three) hours as needed for moderate pain.      Follow-up Information    Romie Levee, MD. Schedule an appointment as soon as possible for a visit in 3 week(s).   Specialty:  General Surgery Contact information: [address] [place] [address] [phone]           Signed: Valarie Merino 02/17/2018, 9:31 AM

## 2018-02-18 ENCOUNTER — Encounter (HOSPITAL_COMMUNITY): Payer: Self-pay | Admitting: General Surgery

## 2018-02-19 DIAGNOSIS — M2041 Other hammer toe(s) (acquired), right foot: Secondary | ICD-10-CM | POA: Diagnosis not present

## 2018-02-19 DIAGNOSIS — K863 Pseudocyst of pancreas: Secondary | ICD-10-CM | POA: Diagnosis not present

## 2018-02-19 DIAGNOSIS — M7741 Metatarsalgia, right foot: Secondary | ICD-10-CM | POA: Diagnosis not present

## 2018-02-19 DIAGNOSIS — M25551 Pain in right hip: Secondary | ICD-10-CM | POA: Diagnosis not present

## 2018-02-19 DIAGNOSIS — K862 Cyst of pancreas: Secondary | ICD-10-CM | POA: Diagnosis not present

## 2018-02-19 DIAGNOSIS — M7742 Metatarsalgia, left foot: Secondary | ICD-10-CM | POA: Diagnosis not present

## 2018-02-19 DIAGNOSIS — M2042 Other hammer toe(s) (acquired), left foot: Secondary | ICD-10-CM | POA: Diagnosis not present

## 2018-02-19 DIAGNOSIS — M25511 Pain in right shoulder: Secondary | ICD-10-CM | POA: Diagnosis not present

## 2018-02-19 DIAGNOSIS — M1611 Unilateral primary osteoarthritis, right hip: Secondary | ICD-10-CM | POA: Diagnosis not present

## 2018-02-21 DIAGNOSIS — M2042 Other hammer toe(s) (acquired), left foot: Secondary | ICD-10-CM | POA: Diagnosis not present

## 2018-02-21 DIAGNOSIS — M2041 Other hammer toe(s) (acquired), right foot: Secondary | ICD-10-CM | POA: Diagnosis not present

## 2018-02-21 DIAGNOSIS — M25551 Pain in right hip: Secondary | ICD-10-CM | POA: Diagnosis not present

## 2018-02-21 DIAGNOSIS — M7741 Metatarsalgia, right foot: Secondary | ICD-10-CM | POA: Diagnosis not present

## 2018-02-21 DIAGNOSIS — M25511 Pain in right shoulder: Secondary | ICD-10-CM | POA: Diagnosis not present

## 2018-02-21 DIAGNOSIS — M1611 Unilateral primary osteoarthritis, right hip: Secondary | ICD-10-CM | POA: Diagnosis not present

## 2018-02-21 DIAGNOSIS — K863 Pseudocyst of pancreas: Secondary | ICD-10-CM | POA: Diagnosis not present

## 2018-02-21 DIAGNOSIS — M7742 Metatarsalgia, left foot: Secondary | ICD-10-CM | POA: Diagnosis not present

## 2018-02-21 DIAGNOSIS — K862 Cyst of pancreas: Secondary | ICD-10-CM | POA: Diagnosis not present

## 2018-02-25 DIAGNOSIS — M7742 Metatarsalgia, left foot: Secondary | ICD-10-CM | POA: Diagnosis not present

## 2018-02-25 DIAGNOSIS — K863 Pseudocyst of pancreas: Secondary | ICD-10-CM | POA: Diagnosis not present

## 2018-02-25 DIAGNOSIS — M25551 Pain in right hip: Secondary | ICD-10-CM | POA: Diagnosis not present

## 2018-02-25 DIAGNOSIS — M25511 Pain in right shoulder: Secondary | ICD-10-CM | POA: Diagnosis not present

## 2018-02-25 DIAGNOSIS — M2041 Other hammer toe(s) (acquired), right foot: Secondary | ICD-10-CM | POA: Diagnosis not present

## 2018-02-25 DIAGNOSIS — M2042 Other hammer toe(s) (acquired), left foot: Secondary | ICD-10-CM | POA: Diagnosis not present

## 2018-02-25 DIAGNOSIS — K862 Cyst of pancreas: Secondary | ICD-10-CM | POA: Diagnosis not present

## 2018-02-25 DIAGNOSIS — M7741 Metatarsalgia, right foot: Secondary | ICD-10-CM | POA: Diagnosis not present

## 2018-02-25 DIAGNOSIS — M1611 Unilateral primary osteoarthritis, right hip: Secondary | ICD-10-CM | POA: Diagnosis not present

## 2018-02-26 ENCOUNTER — Other Ambulatory Visit: Payer: Self-pay | Admitting: Family Medicine

## 2018-02-26 DIAGNOSIS — I10 Essential (primary) hypertension: Secondary | ICD-10-CM

## 2018-02-27 DIAGNOSIS — K862 Cyst of pancreas: Secondary | ICD-10-CM | POA: Diagnosis not present

## 2018-02-27 DIAGNOSIS — M2042 Other hammer toe(s) (acquired), left foot: Secondary | ICD-10-CM | POA: Diagnosis not present

## 2018-02-27 DIAGNOSIS — K863 Pseudocyst of pancreas: Secondary | ICD-10-CM | POA: Diagnosis not present

## 2018-02-27 DIAGNOSIS — M25511 Pain in right shoulder: Secondary | ICD-10-CM | POA: Diagnosis not present

## 2018-02-27 DIAGNOSIS — M7741 Metatarsalgia, right foot: Secondary | ICD-10-CM | POA: Diagnosis not present

## 2018-02-27 DIAGNOSIS — M1611 Unilateral primary osteoarthritis, right hip: Secondary | ICD-10-CM | POA: Diagnosis not present

## 2018-02-27 DIAGNOSIS — M2041 Other hammer toe(s) (acquired), right foot: Secondary | ICD-10-CM | POA: Diagnosis not present

## 2018-02-27 DIAGNOSIS — M25551 Pain in right hip: Secondary | ICD-10-CM | POA: Diagnosis not present

## 2018-02-27 DIAGNOSIS — M7742 Metatarsalgia, left foot: Secondary | ICD-10-CM | POA: Diagnosis not present

## 2018-03-04 DIAGNOSIS — M25511 Pain in right shoulder: Secondary | ICD-10-CM | POA: Diagnosis not present

## 2018-03-04 DIAGNOSIS — M2042 Other hammer toe(s) (acquired), left foot: Secondary | ICD-10-CM | POA: Diagnosis not present

## 2018-03-04 DIAGNOSIS — M25551 Pain in right hip: Secondary | ICD-10-CM | POA: Diagnosis not present

## 2018-03-04 DIAGNOSIS — M7741 Metatarsalgia, right foot: Secondary | ICD-10-CM | POA: Diagnosis not present

## 2018-03-04 DIAGNOSIS — K863 Pseudocyst of pancreas: Secondary | ICD-10-CM | POA: Diagnosis not present

## 2018-03-04 DIAGNOSIS — M7742 Metatarsalgia, left foot: Secondary | ICD-10-CM | POA: Diagnosis not present

## 2018-03-04 DIAGNOSIS — M1611 Unilateral primary osteoarthritis, right hip: Secondary | ICD-10-CM | POA: Diagnosis not present

## 2018-03-04 DIAGNOSIS — K862 Cyst of pancreas: Secondary | ICD-10-CM | POA: Diagnosis not present

## 2018-03-04 DIAGNOSIS — M2041 Other hammer toe(s) (acquired), right foot: Secondary | ICD-10-CM | POA: Diagnosis not present

## 2018-03-06 DIAGNOSIS — M2041 Other hammer toe(s) (acquired), right foot: Secondary | ICD-10-CM | POA: Diagnosis not present

## 2018-03-06 DIAGNOSIS — M2042 Other hammer toe(s) (acquired), left foot: Secondary | ICD-10-CM | POA: Diagnosis not present

## 2018-03-06 DIAGNOSIS — M25511 Pain in right shoulder: Secondary | ICD-10-CM | POA: Diagnosis not present

## 2018-03-06 DIAGNOSIS — M1611 Unilateral primary osteoarthritis, right hip: Secondary | ICD-10-CM | POA: Diagnosis not present

## 2018-03-06 DIAGNOSIS — K863 Pseudocyst of pancreas: Secondary | ICD-10-CM | POA: Diagnosis not present

## 2018-03-06 DIAGNOSIS — M25551 Pain in right hip: Secondary | ICD-10-CM | POA: Diagnosis not present

## 2018-03-06 DIAGNOSIS — M7742 Metatarsalgia, left foot: Secondary | ICD-10-CM | POA: Diagnosis not present

## 2018-03-06 DIAGNOSIS — M7741 Metatarsalgia, right foot: Secondary | ICD-10-CM | POA: Diagnosis not present

## 2018-03-06 DIAGNOSIS — K862 Cyst of pancreas: Secondary | ICD-10-CM | POA: Diagnosis not present

## 2018-03-10 ENCOUNTER — Telehealth: Payer: Self-pay | Admitting: Family Medicine

## 2018-03-10 DIAGNOSIS — F411 Generalized anxiety disorder: Secondary | ICD-10-CM

## 2018-03-10 MED ORDER — ALPRAZOLAM 0.25 MG PO TABS: 0.2500 mg | ORAL_TABLET | Freq: Every day | ORAL | 3 refills | Status: DC | PRN

## 2018-03-10 NOTE — Telephone Encounter (Signed)
I called the patient and let her know that Dr. Linford ArnoldMetheney has sent in a new prescription and it should be ready at the pharmacy. Patient was appreciative of Dr. Eppie GibsonMetheny giving her the medication. I have called the pharmacy to cancel the previous Rx for Xanax and per Asher MuirJamie at the pharmacy she could fill the Xanax today.

## 2018-03-10 NOTE — Telephone Encounter (Signed)
I was under the impression we were trying to wean her off the medication which is why it was changed to 25 tabs per month.  New rx sent for 30 tabs per month.

## 2018-03-10 NOTE — Telephone Encounter (Signed)
Patient called in a panic wanting to know why someone in our office placed a note that the patient could not pick her Rx up until 03/12/2018. She then went on to say "the powers that be need to have a covcersation to get her Xanax released because she has had to get someone to drive her to the pharmacy 3 times".   Patient reports that she is taking this tablet once daily and she does not want to come off this medication at all. Patient states she will be with her daughter part of the day. She is willing to try something else you recommend at this time she is willing to do whatever it takes so she does not have to fight for her medication every month. Please advise.

## 2018-03-11 DIAGNOSIS — M2041 Other hammer toe(s) (acquired), right foot: Secondary | ICD-10-CM | POA: Diagnosis not present

## 2018-03-11 DIAGNOSIS — M25551 Pain in right hip: Secondary | ICD-10-CM | POA: Diagnosis not present

## 2018-03-11 DIAGNOSIS — M1611 Unilateral primary osteoarthritis, right hip: Secondary | ICD-10-CM | POA: Diagnosis not present

## 2018-03-11 DIAGNOSIS — M7741 Metatarsalgia, right foot: Secondary | ICD-10-CM | POA: Diagnosis not present

## 2018-03-11 DIAGNOSIS — K863 Pseudocyst of pancreas: Secondary | ICD-10-CM | POA: Diagnosis not present

## 2018-03-11 DIAGNOSIS — K862 Cyst of pancreas: Secondary | ICD-10-CM | POA: Diagnosis not present

## 2018-03-11 DIAGNOSIS — M25511 Pain in right shoulder: Secondary | ICD-10-CM | POA: Diagnosis not present

## 2018-03-11 DIAGNOSIS — M2042 Other hammer toe(s) (acquired), left foot: Secondary | ICD-10-CM | POA: Diagnosis not present

## 2018-03-11 DIAGNOSIS — M7742 Metatarsalgia, left foot: Secondary | ICD-10-CM | POA: Diagnosis not present

## 2018-03-13 DIAGNOSIS — M25551 Pain in right hip: Secondary | ICD-10-CM | POA: Diagnosis not present

## 2018-03-13 DIAGNOSIS — M1611 Unilateral primary osteoarthritis, right hip: Secondary | ICD-10-CM | POA: Diagnosis not present

## 2018-03-13 DIAGNOSIS — K862 Cyst of pancreas: Secondary | ICD-10-CM | POA: Diagnosis not present

## 2018-03-13 DIAGNOSIS — M7741 Metatarsalgia, right foot: Secondary | ICD-10-CM | POA: Diagnosis not present

## 2018-03-13 DIAGNOSIS — M2042 Other hammer toe(s) (acquired), left foot: Secondary | ICD-10-CM | POA: Diagnosis not present

## 2018-03-13 DIAGNOSIS — K863 Pseudocyst of pancreas: Secondary | ICD-10-CM | POA: Diagnosis not present

## 2018-03-13 DIAGNOSIS — M7742 Metatarsalgia, left foot: Secondary | ICD-10-CM | POA: Diagnosis not present

## 2018-03-13 DIAGNOSIS — M25511 Pain in right shoulder: Secondary | ICD-10-CM | POA: Diagnosis not present

## 2018-03-13 DIAGNOSIS — M2041 Other hammer toe(s) (acquired), right foot: Secondary | ICD-10-CM | POA: Diagnosis not present

## 2018-03-17 ENCOUNTER — Telehealth: Payer: Self-pay | Admitting: Family Medicine

## 2018-03-17 ENCOUNTER — Encounter: Payer: Self-pay | Admitting: Family Medicine

## 2018-03-17 ENCOUNTER — Ambulatory Visit (INDEPENDENT_AMBULATORY_CARE_PROVIDER_SITE_OTHER): Payer: PRIVATE HEALTH INSURANCE | Admitting: Family Medicine

## 2018-03-17 VITALS — BP 138/79 | HR 74 | Ht 67.0 in | Wt 162.0 lb

## 2018-03-17 DIAGNOSIS — Z8719 Personal history of other diseases of the digestive system: Secondary | ICD-10-CM | POA: Diagnosis not present

## 2018-03-17 DIAGNOSIS — R29898 Other symptoms and signs involving the musculoskeletal system: Secondary | ICD-10-CM

## 2018-03-17 DIAGNOSIS — I9589 Other hypotension: Secondary | ICD-10-CM | POA: Diagnosis not present

## 2018-03-17 DIAGNOSIS — E861 Hypovolemia: Secondary | ICD-10-CM

## 2018-03-17 DIAGNOSIS — R058 Other specified cough: Secondary | ICD-10-CM

## 2018-03-17 DIAGNOSIS — R05 Cough: Secondary | ICD-10-CM

## 2018-03-17 MED ORDER — ESOMEPRAZOLE MAGNESIUM 40 MG PO CPDR: DELAYED_RELEASE_CAPSULE | ORAL | 1 refills | Status: DC

## 2018-03-17 NOTE — Progress Notes (Signed)
Subjective:    Patient ID: Tonya [name], female    DOB: Oct 27, 1926, 84 y.o.   MRN: [id number]  HPI 84 year old female is here today for hospital follow-up for recent appendectomy, on September 1.  Her pressure was low when she was discharged home from the actually doing much better.  She has had her follow-up with Dr. Janee Morn the surgeon and is healing well.  Appetite is back to normal.  She has not had any fever chills or change in bowel movements.  She still has had a mild dry cough that really started before her surgery.  No productive sputum no fevers chills or sweats.  She has had some aching in her lower legs from her knees down to her feet bilaterally.  No swelling.  It is not keeping her from doing anything.  She actually has been participating in home physical therapy to work on strengthening and she now actually has a rolling walker which she says is been really helpful for helping her stand up more straight.  She is also been working on lower leg strengthening to help her stand up without having to use a device to assist her.  She feels like her balance is still off but she does feel a little bit more confident.  Hypotension-her blood pressure was low at discharge from the hospital and she says it actually stayed low for several days but she seems to be feeling better  Review of Systems  BP 138/79   Pulse 74   Ht 5\' 7"  (1.702 m)   Wt 162 lb (73.5 kg)   SpO2 95%   BMI 25.37 kg/m     Allergies  Allergen Reactions  . Amlodipine Other (See Comments)    Ankle swelling.   . Codeine     Other reaction(s): Unknown  . Tramadol Nausea Only    Past Medical History:  Diagnosis Date  . Anxiety   . Cystocele   . Degenerative disc disease   . Hypertension   . IBS (irritable bowel syndrome)   . Rectocele     Past Surgical History:  Procedure Laterality Date  . ABDOMINAL HYSTERECTOMY     partial  . CHOLECYSTECTOMY    . LAPAROSCOPIC APPENDECTOMY N/A 02/16/2018   Procedure:  APPENDECTOMY LAPAROSCOPIC;  Surgeon: Romie Levee, MD;  Location: [facility];  Service: General;  Laterality: N/A;    Social History   Socioeconomic History  . Marital status: Widowed    Spouse name: Not on file  . Number of children: Not on file  . Years of education: Not on file  . Highest education level: Not on file  Occupational History  . Not on file  Social Needs  . Financial resource strain: Not on file  . Food insecurity:    Worry: Not on file    Inability: Not on file  . Transportation needs:    Medical: Not on file    Non-medical: Not on file  Tobacco Use  . Smoking status: Never Smoker  . Smokeless tobacco: Never Used  Substance and Sexual Activity  . Alcohol use: No  . Drug use: No  . 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  Lifestyle  . Physical activity:    Days per week: Not on file    Minutes per session: Not on file  . Stress: Not on file  Relationships  . Social connections:  Talks on phone: Not on file    Gets together: Not on file    Attends religious service: Not on file    Active member of club or organization: Not on file    Attends meetings of clubs or organizations: Not on file    Relationship status: Not on file  . Intimate partner violence:    Fear of current or ex partner: Not on file    Emotionally abused: Not on file    Physically abused: Not on file    Forced sexual activity: Not on file  Other Topics Concern  . Not on file  Social History Narrative  . Not on file    Family History  Problem Relation Age of Onset  . Depression Unknown   . GER disease Son   . Depression Son     Outpatient Encounter Medications as of 03/17/2018  Medication Sig  . ALPRAZolam (XANAX) 0.25 MG tablet Take 1 tablet (0.25 mg total) by mouth daily as needed for anxiety. Ok to fill today.  . AMBULATORY NON FORMULARY MEDICATION Rollator with seat.  Dx right hip pain M25.551 [facility] [phone]  . escitalopram (LEXAPRO) 10 MG tablet TAKE 1 TABLET(10 MG) BY MOUTH DAILY  . esomeprazole (NEXIUM) 40 MG capsule TAKE ONE CAPSULE BY MOUTH DAILY AT NOON  . hydrochlorothiazide (MICROZIDE) 12.5 MG capsule TAKE 1 CAPSULE(12.5 MG) BY MOUTH DAILY  . [DISCONTINUED] esomeprazole (NEXIUM) 40 MG capsule TAKE ONE CAPSULE BY MOUTH DAILY AT NOON (Patient taking differently: Take 40 mg by mouth daily. )  . [DISCONTINUED] diclofenac sodium (VOLTAREN) 1 % GEL Apply 2 g topically 4 (four) times daily. To affected joint. (Patient taking differently: Apply 2 g topically 4 (four) times daily as needed (pain). To affected joint.)   No facility-administered encounter medications on file as of 03/17/2018.          Objective:   Physical Exam  Constitutional: She is oriented to person, place, and time. She appears well-developed and well-nourished.  HENT:  Head: Normocephalic and atraumatic.  Cardiovascular: Normal rate, regular rhythm and normal heart sounds.  Pulmonary/Chest: Effort normal and breath sounds normal.  Musculoskeletal: She exhibits no edema, tenderness or deformity.  Lower legs with no erythema rash or swelling.  Nontender calves on exam.  Neurological: She is alert and oriented to person, place, and time.  Skin: Skin is warm and dry.    Incisions on abdomen are well-healed.  Psychiatric: She has a normal mood and affect. Her behavior is normal.       Assessment & Plan:  Cough-unclear etiology lungs are still clear.  We will keep an eye on this is not very bothersome at this point.  History of appendicitis-healing well and recovering well her incisions look absolutely fantastic.  Extremity weakness-discussed options.  She like to actually try to extend her PT for about 2 more weeks.  I think that would be a great idea.  Hypotension - resolved.

## 2018-03-17 NOTE — Telephone Encounter (Signed)
[facility] call [facility] and have them add an additional 2 weeks of home PT to her regimen.

## 2018-03-18 DIAGNOSIS — K862 Cyst of pancreas: Secondary | ICD-10-CM | POA: Diagnosis not present

## 2018-03-18 DIAGNOSIS — M2042 Other hammer toe(s) (acquired), left foot: Secondary | ICD-10-CM | POA: Diagnosis not present

## 2018-03-18 DIAGNOSIS — M25551 Pain in right hip: Secondary | ICD-10-CM | POA: Diagnosis not present

## 2018-03-18 DIAGNOSIS — M2041 Other hammer toe(s) (acquired), right foot: Secondary | ICD-10-CM | POA: Diagnosis not present

## 2018-03-18 DIAGNOSIS — M7742 Metatarsalgia, left foot: Secondary | ICD-10-CM | POA: Diagnosis not present

## 2018-03-18 DIAGNOSIS — K863 Pseudocyst of pancreas: Secondary | ICD-10-CM | POA: Diagnosis not present

## 2018-03-18 DIAGNOSIS — M25511 Pain in right shoulder: Secondary | ICD-10-CM | POA: Diagnosis not present

## 2018-03-18 DIAGNOSIS — M1611 Unilateral primary osteoarthritis, right hip: Secondary | ICD-10-CM | POA: Diagnosis not present

## 2018-03-18 DIAGNOSIS — M7741 Metatarsalgia, right foot: Secondary | ICD-10-CM | POA: Diagnosis not present

## 2018-03-18 NOTE — Telephone Encounter (Signed)
[facility], they have not seen the Pt since 2016. Routing.

## 2018-03-18 NOTE — Telephone Encounter (Signed)
Spoke w/Merideth and gave VO for PT to be continued for an additional 2 wks.Heath Gold, CMA

## 2018-03-18 NOTE — Telephone Encounter (Signed)
Pt uses [facility].Heath Gold, CMA

## 2018-03-19 ENCOUNTER — Telehealth: Payer: Self-pay

## 2018-03-19 NOTE — Telephone Encounter (Signed)
Myra from [facility] called to let Dr Linford Arnold know pt will be discharged Friday due to meeting all her goals.   FYI to PCP

## 2018-03-21 DIAGNOSIS — K862 Cyst of pancreas: Secondary | ICD-10-CM | POA: Diagnosis not present

## 2018-03-21 DIAGNOSIS — M2041 Other hammer toe(s) (acquired), right foot: Secondary | ICD-10-CM | POA: Diagnosis not present

## 2018-03-21 DIAGNOSIS — K863 Pseudocyst of pancreas: Secondary | ICD-10-CM | POA: Diagnosis not present

## 2018-03-21 DIAGNOSIS — M7742 Metatarsalgia, left foot: Secondary | ICD-10-CM | POA: Diagnosis not present

## 2018-03-21 DIAGNOSIS — M7741 Metatarsalgia, right foot: Secondary | ICD-10-CM | POA: Diagnosis not present

## 2018-03-21 DIAGNOSIS — M1611 Unilateral primary osteoarthritis, right hip: Secondary | ICD-10-CM | POA: Diagnosis not present

## 2018-03-21 DIAGNOSIS — M2042 Other hammer toe(s) (acquired), left foot: Secondary | ICD-10-CM | POA: Diagnosis not present

## 2018-03-21 DIAGNOSIS — M25551 Pain in right hip: Secondary | ICD-10-CM | POA: Diagnosis not present

## 2018-03-21 DIAGNOSIS — M25511 Pain in right shoulder: Secondary | ICD-10-CM | POA: Diagnosis not present

## 2018-04-23 ENCOUNTER — Telehealth: Payer: Self-pay

## 2018-04-23 NOTE — Telephone Encounter (Signed)
[name] called and states she has this lingering cough. Denies shortness of breath or any other breathing problems. She states she has coughing fits and produces clear sputum. She is taking Delsym with little relief. I advised to push fluids. She wanted to know what Dr Linford Arnold would recommend.

## 2018-04-24 MED ORDER — BENZONATATE 200 MG PO CAPS: 200.0000 mg | ORAL_CAPSULE | Freq: Two times a day (BID) | ORAL | 0 refills | Status: DC | PRN

## 2018-04-24 NOTE — Telephone Encounter (Signed)
I called in Tessalon perles. If this is the same cough she had I sept then I need to see her and e need to get a chest xray.

## 2018-04-25 NOTE — Telephone Encounter (Signed)
Patient advised and scheduled.  

## 2018-04-28 ENCOUNTER — Encounter: Payer: Self-pay | Admitting: Family Medicine

## 2018-04-28 ENCOUNTER — Ambulatory Visit (INDEPENDENT_AMBULATORY_CARE_PROVIDER_SITE_OTHER): Payer: PRIVATE HEALTH INSURANCE | Admitting: Family Medicine

## 2018-04-28 VITALS — BP 122/64 | HR 65 | Ht 67.0 in | Wt 166.0 lb

## 2018-04-28 DIAGNOSIS — J40 Bronchitis, not specified as acute or chronic: Secondary | ICD-10-CM | POA: Diagnosis not present

## 2018-04-28 DIAGNOSIS — R05 Cough: Secondary | ICD-10-CM

## 2018-04-28 DIAGNOSIS — R059 Cough, unspecified: Secondary | ICD-10-CM

## 2018-04-28 NOTE — Progress Notes (Signed)
   Subjective:    Patient ID: Tonya [name], female    DOB: 11/29/1926, 84 y.o.   MRN: [id number]  HPI 84 year old female comes in today to follow-up for cough.  She says she is had a low-grade chronic cough for years but more recently her son who lives with her came home with what sounds like a sinus infection and some bronchitis that he picked up at work.  Her cough increased.  She had no fever chills or sweats.  Just had increased postnasal drip and coughing fits.  She started her Zyrtec which she is been taking in the mornings.  Is been making her very sleepy but she notices a big difference in her cough.  She feels like the cough is significantly better and she is almost back to baseline.  No increased work of breathing or shortness of breath.  No significant weight changes. She taking half a tab of zyrtec.    Review of Systems     Objective:   Physical Exam  Constitutional: She is oriented to person, place, and time. She appears well-developed and well-nourished.  HENT:  Head: Normocephalic and atraumatic.  Right Ear: External ear normal.  Left Ear: External ear normal.  Nose: Nose normal.  Mouth/Throat: Oropharynx is clear and moist.  Both canals blocked by cerumen.  Eyes: Pupils are equal, round, and reactive to light. Conjunctivae and EOM are normal.  Neck: Neck supple. No thyromegaly present.  Cardiovascular: Normal rate, regular rhythm and normal heart sounds.  Pulmonary/Chest: Effort normal and breath sounds normal. She has no wheezes.  Lymphadenopathy:    She has no cervical adenopathy.  Neurological: She is alert and oriented to person, place, and time.  Skin: Skin is warm and dry.  Psychiatric: She has a normal mood and affect. Her behavior is normal.          Assessment & Plan:  Bronchitis/cough-it sounds like her symptoms are improving on their own and she is getting some relief with his Zyrtec for some of this certainly could be allergic.  Encouraged her to  move her Zyrtec to bedtime.  We will hold off on chest x-ray for now unless cough persists or suddenly gets worse or she becomes more short of breath.  Continue Tessalon as needed.

## 2018-05-28 ENCOUNTER — Other Ambulatory Visit: Payer: Self-pay | Admitting: Family Medicine

## 2018-06-02 DIAGNOSIS — M2041 Other hammer toe(s) (acquired), right foot: Secondary | ICD-10-CM | POA: Diagnosis not present

## 2018-06-02 DIAGNOSIS — L600 Ingrowing nail: Secondary | ICD-10-CM | POA: Diagnosis not present

## 2018-06-03 ENCOUNTER — Telehealth: Payer: Self-pay

## 2018-06-03 NOTE — Telephone Encounter (Signed)
Tonya [name] went to a Podiatrist and had a tumor removed from her toe. She reports being pain free this morning.   Lesly RubensteinWeinbaum, Richard Stanley, DPM  [address]  [id number]  [place], [place] [id number]  [phone]  [phone] (Fax)

## 2018-06-03 NOTE — Telephone Encounter (Signed)
OK please call for records.

## 2018-06-04 NOTE — Telephone Encounter (Signed)
Records requested

## 2018-06-10 ENCOUNTER — Other Ambulatory Visit: Payer: Self-pay | Admitting: Family Medicine

## 2018-06-10 DIAGNOSIS — I10 Essential (primary) hypertension: Secondary | ICD-10-CM

## 2018-06-23 DIAGNOSIS — M722 Plantar fascial fibromatosis: Secondary | ICD-10-CM | POA: Diagnosis not present

## 2018-06-23 DIAGNOSIS — M2041 Other hammer toe(s) (acquired), right foot: Secondary | ICD-10-CM | POA: Diagnosis not present

## 2018-06-26 ENCOUNTER — Other Ambulatory Visit: Payer: Self-pay | Admitting: Family Medicine

## 2018-07-07 ENCOUNTER — Other Ambulatory Visit: Payer: Self-pay | Admitting: Family Medicine

## 2018-07-07 DIAGNOSIS — F411 Generalized anxiety disorder: Secondary | ICD-10-CM

## 2018-07-09 DIAGNOSIS — M2041 Other hammer toe(s) (acquired), right foot: Secondary | ICD-10-CM | POA: Diagnosis not present

## 2018-07-09 DIAGNOSIS — M722 Plantar fascial fibromatosis: Secondary | ICD-10-CM | POA: Diagnosis not present

## 2018-07-25 ENCOUNTER — Other Ambulatory Visit: Payer: Self-pay | Admitting: Family Medicine

## 2018-07-25 ENCOUNTER — Other Ambulatory Visit: Payer: Self-pay | Admitting: *Deleted

## 2018-08-04 DIAGNOSIS — M722 Plantar fascial fibromatosis: Secondary | ICD-10-CM | POA: Diagnosis not present

## 2018-08-04 DIAGNOSIS — M792 Neuralgia and neuritis, unspecified: Secondary | ICD-10-CM | POA: Diagnosis not present

## 2018-08-14 NOTE — Progress Notes (Deleted)
Subjective:   Tonya [name] is a 85 y.o. female who presents for an Initial Medicare Annual Wellness Visit.  Review of Systems    No ROS.  Medicare Wellness Visit. Additional risk factors are reflected in the social history.     Sleep patterns:  Home Safety/Smoke Alarms: Feels safe in home. Smoke alarms in place.  Living environment;  Seat Belt Safety/Bike Helmet: Wears seat belt.   Female:   Pap-  Aged out     Mammo- aged out      Dexa scan-        CCS- aged out     Objective:    There were no vitals filed for this visit. There is no height or weight on file to calculate BMI.  Advanced Directives 02/16/2018 02/16/2018 10/31/2016 11/06/2013  Does Patient Have a Medical Advance Directive? Yes Yes No Patient has advance directive, copy in chart  Type of Advance Directive Healthcare Power of [place];Living will Healthcare Power of [place];Living will - -  Does patient want to make changes to medical advance directive? No - Patient declined - - -  Copy of Healthcare Power of Attorney in Chart? No - copy requested No - copy requested - -  Would patient like information on creating a medical advance directive? - - No - Patient declined -    Current Medications (verified) Outpatient Encounter Medications as of 08/18/2018  Medication Sig  . ALPRAZolam (XANAX) 0.25 MG tablet TAKE 1 TABLET BY MOUTH DAILY AS NEEDED FOR ANXIETY  . escitalopram (LEXAPRO) 10 MG tablet Take 1 tablet (10 mg total) by mouth daily. LAST REFILL.APPOINTMENT REQUIRED  . esomeprazole (NEXIUM) 40 MG capsule TAKE ONE CAPSULE BY MOUTH DAILY AT NOON  . hydrochlorothiazide (MICROZIDE) 12.5 MG capsule TAKE 1 CAPSULE(12.5 MG) BY MOUTH DAILY   No facility-administered encounter medications on file as of 08/18/2018.     Allergies (verified) Amlodipine; Codeine; and Tramadol   History: Past Medical History:  Diagnosis Date  . Anxiety   . Cystocele   . Degenerative disc disease   . Hypertension   . IBS (irritable  bowel syndrome)   . Rectocele    Past Surgical History:  Procedure Laterality Date  . ABDOMINAL HYSTERECTOMY     partial  . CHOLECYSTECTOMY    . LAPAROSCOPIC APPENDECTOMY N/A 02/16/2018   Procedure: APPENDECTOMY LAPAROSCOPIC;  Surgeon: Romie Levee, MD;  Location: [facility];  Service: General;  Laterality: N/A;   Family History  Problem Relation Age of Onset  . Depression Unknown   . GER disease Son   . Depression Son    Social History   Socioeconomic History  . Marital status: Widowed    Spouse name: Not on file  . Number of children: Not on file  . Years of education: Not on file  . Highest education level: Not on file  Occupational History  . Not on file  Social Needs  . Financial resource strain: Not on file  . Food insecurity:    Worry: Not on file    Inability: Not on file  . Transportation needs:    Medical: Not on file    Non-medical: Not on file  Tobacco Use  . Smoking status: Never Smoker  . Smokeless tobacco: Never Used  Substance and Sexual Activity  . Alcohol use: No  . Drug use: No  . 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  Lifestyle  . Physical activity:    Days per week: Not on file    Minutes per session: Not on file  . Stress: Not on file  Relationships  . Social connections:    Talks on phone: Not on file    Gets together: Not on file    Attends religious service: Not on file    Active member of club or organization: Not on file    Attends meetings of clubs or organizations: Not on file    Relationship status: Not on file  Other Topics Concern  . Not on file  Social History Narrative  . Not on file    Tobacco Counseling Counseling given: Not Answered   Clinical Intake:                        Activities of Daily Living In your present state of health, do you have any difficulty performing the following activities:  02/16/2018 02/16/2018  Hearing? N N  Vision? N N  Difficulty concentrating or making decisions? N N  Walking or climbing stairs? N -  Dressing or bathing? Y Y  Doing errands, shopping? N -  Some recent data might be hidden     Immunizations and Health Maintenance Immunization History  Administered Date(s) Administered  . Pneumococcal Conjugate-13 02/23/2014  . Pneumococcal Polysaccharide-23 08/20/2017  . Tdap 06/21/2011   There are no preventive care reminders to display for this patient.  Patient Care Team: Agapito GamesMetheney, Catherine D, MD as PCP - General  Indicate any recent Medical Services you may have received from other than [facility] providers in the past year (date may be approximate).     Assessment:   This is a routine wellness examination for [place].Physical assessment deferred to PCP.   Hearing/Vision screen No exam data present  Dietary issues and exercise activities discussed:   Diet  Breakfast: Lunch:  Dinner:       Goals   None    Depression Screen PHQ 2/9 Scores 01/28/2018 08/20/2017 04/09/2017 09/26/2016 02/06/2016 02/23/2014  PHQ - 2 Score 0 0 0 0 0 0  PHQ- 9 Score - 1 - 1 - -    Fall Risk Fall Risk  04/09/2017 02/06/2016 02/23/2014 11/06/2013 09/09/2013  Falls in the past year? No No Yes Yes Yes  Number falls in past yr: - - 1 1 1   Injury with Fall? - - - Yes Yes  Risk for fall due to : - - - - Impaired balance/gait    Is the patient's home free of loose throw rugs in walkways, pet beds, electrical cords, etc?   {Blank single:19197::"yes","no"}      Grab bars in the bathroom? {Blank single:19197::"yes","no"}      Handrails on the stairs?   {Blank single:19197::"yes","no"}      Adequate lighting?   {Blank single:19197::"yes","no"}  Cognitive Function:        Screening Tests Health Maintenance  Topic Date Due  . INFLUENZA VACCINE  06/17/2020 (Originally 01/16/2018)  . TETANUS/TDAP  06/20/2021  . DEXA SCAN  Completed  . PNA vac Low Risk Adult  Completed        Plan:   ***  I have personally reviewed and noted the following in the patient's chart:   . Medical and social history . Use of alcohol, tobacco or illicit drugs  . Current medications and supplements . Functional ability and status . Nutritional status . Physical activity . Advanced directives . List of other physicians .  Hospitalizations, surgeries, and ER visits in previous 12 months . Vitals . Screenings to include cognitive, depression, and falls . Referrals and appointments  In addition, I have reviewed and discussed with patient certain preventive protocols, quality metrics, and best practice recommendations. A written personalized care plan for preventive services as well as general preventive health recommendations were provided to patient.     Normand Sloop, LPN   [id number]/17/[id number]

## 2018-08-18 ENCOUNTER — Ambulatory Visit: Payer: PRIVATE HEALTH INSURANCE

## 2018-08-26 ENCOUNTER — Other Ambulatory Visit: Payer: Self-pay | Admitting: Family Medicine

## 2018-09-18 ENCOUNTER — Other Ambulatory Visit: Payer: Self-pay | Admitting: Family Medicine

## 2018-09-18 DIAGNOSIS — I10 Essential (primary) hypertension: Secondary | ICD-10-CM

## 2018-09-25 ENCOUNTER — Other Ambulatory Visit: Payer: Self-pay | Admitting: *Deleted

## 2018-09-25 ENCOUNTER — Telehealth (INDEPENDENT_AMBULATORY_CARE_PROVIDER_SITE_OTHER): Payer: Medicare HMO | Admitting: Family Medicine

## 2018-09-25 ENCOUNTER — Encounter: Payer: Self-pay | Admitting: Family Medicine

## 2018-09-25 ENCOUNTER — Other Ambulatory Visit: Payer: Self-pay | Admitting: Family Medicine

## 2018-09-25 VITALS — BP 137/69 | HR 71 | Temp 97.3°F | Ht 67.0 in | Wt 166.0 lb

## 2018-09-25 DIAGNOSIS — M5431 Sciatica, right side: Secondary | ICD-10-CM | POA: Diagnosis not present

## 2018-09-25 DIAGNOSIS — I10 Essential (primary) hypertension: Secondary | ICD-10-CM

## 2018-09-25 DIAGNOSIS — F411 Generalized anxiety disorder: Secondary | ICD-10-CM

## 2018-09-25 DIAGNOSIS — R053 Chronic cough: Secondary | ICD-10-CM

## 2018-09-25 DIAGNOSIS — R05 Cough: Secondary | ICD-10-CM

## 2018-09-25 NOTE — Progress Notes (Addendum)
Virtual Visit via Telephone Note  I connected with Tonya [name] on 09/25/18 at  2:00 PM EDT by telephone and verified that I am speaking with the correct person using two identifiers.   I discussed the limitations, risks, security and privacy concerns of performing an evaluation and management service by telephone and the availability of in person appointments. I also discussed with the patient that there may be a patient responsible charge related to this service. The patient expressed understanding and agreed to proceed.   Subjective:    CC: Mood  HPI:  She has been some stressed with everything going on.  She is requesting a refill on her Lexapro.  She continues to take her Xanax half a tab twice a day but wonders if she needs to do a whole tab twice a day.  She had her foot surgeyr with Dr. Josephina GipWeinbaum. It is still sensitive easily if touches her shoe.  She is taking her Lexapro. She has been social distancing.     Hypertension- Pt denies chest pain, SOB, dizziness, or heart palpitations.  Taking meds as directed w/o problems.  Denies medication side effects.   Home readings: 135/73, 139/78, 128/75, 129/65 p 71, 136/72 p75 over the last week  Feels like her sciatic is flaring on the right side.  She says it flares from time to time.  Some days are better other days it can keep her awake.  Radiating from buttock down to the leg.  She was given gababentin by the podiatrist. She never started it.  She is mostly been relying on Tylenol.  For her chronic cough she says she did start taking the Zyrtec and that actually has been really helpful so she thinks her cough is probably related to allergies.  Past medical history, Surgical history, Family history not pertinant except as noted below, Social history, Allergies, and medications have been entered into the medical record, reviewed, and corrections made.   Review of Systems: No fevers, chills, night sweats, weight loss, chest pain, or  shortness of breath.   Objective:    General: Speaking clearly in complete sentences without any shortness of breath.  Alert and oriented x3.  Normal judgment. No apparent acute distress.    Impression and Recommendations:   Anxiety - she has been doing really well.  Will refill Lexapro.  Reminded her to continue to use the alprazolam very sparingly and okay to continue with a half a tab twice a day I do not want her to go up and actually think she is coping with things really well.  She has only had one panic attack. Encouraged her to contijue to work on her deep breathing.  She is in good contact with her family which is also helpful.  Chronic cough likely secondary to seasonal allergies.- taking zyrtec and that really helps.   HTN - Well controlled. Continue current regimen. Follow up in  6 months.    Right sided sciatica - uses hemp freeze.  Can use Aleve can use some if needed.  Otherwise continue with Tylenol.  If not improving then call back.     I discussed the assessment and treatment plan with the patient. The patient was provided an opportunity to ask questions and all were answered. The patient agreed with the plan and demonstrated an understanding of the instructions.   The patient was advised to call back or seek an in-person evaluation if the symptoms worsen or if the condition fails to improve as anticipated.  I provided 25 minutes of non-face-to-face time during this encounter.   [name], MD

## 2018-09-25 NOTE — Progress Notes (Signed)
Pt stated that since she had her surgery she has become more anxious and feels that she will need an increase in her medication. Laureen Ochs, Viann Shove, CMA

## 2018-10-16 ENCOUNTER — Other Ambulatory Visit: Payer: Self-pay | Admitting: Family Medicine

## 2018-10-30 ENCOUNTER — Other Ambulatory Visit: Payer: Self-pay | Admitting: Family Medicine

## 2018-10-30 DIAGNOSIS — M170 Bilateral primary osteoarthritis of knee: Secondary | ICD-10-CM | POA: Diagnosis not present

## 2018-10-30 DIAGNOSIS — F411 Generalized anxiety disorder: Secondary | ICD-10-CM

## 2018-12-23 ENCOUNTER — Other Ambulatory Visit: Payer: Self-pay | Admitting: Family Medicine

## 2018-12-23 DIAGNOSIS — I10 Essential (primary) hypertension: Secondary | ICD-10-CM

## 2018-12-26 ENCOUNTER — Other Ambulatory Visit: Payer: Self-pay | Admitting: Family Medicine

## 2018-12-26 DIAGNOSIS — F411 Generalized anxiety disorder: Secondary | ICD-10-CM

## 2019-01-23 DIAGNOSIS — R52 Pain, unspecified: Secondary | ICD-10-CM | POA: Diagnosis not present

## 2019-01-23 DIAGNOSIS — M48061 Spinal stenosis, lumbar region without neurogenic claudication: Secondary | ICD-10-CM | POA: Diagnosis not present

## 2019-01-23 DIAGNOSIS — M1711 Unilateral primary osteoarthritis, right knee: Secondary | ICD-10-CM | POA: Diagnosis not present

## 2019-01-23 DIAGNOSIS — M1611 Unilateral primary osteoarthritis, right hip: Secondary | ICD-10-CM | POA: Diagnosis not present

## 2019-03-23 ENCOUNTER — Other Ambulatory Visit: Payer: Self-pay | Admitting: Family Medicine

## 2019-03-23 DIAGNOSIS — I10 Essential (primary) hypertension: Secondary | ICD-10-CM

## 2019-03-23 DIAGNOSIS — F411 Generalized anxiety disorder: Secondary | ICD-10-CM

## 2019-04-27 ENCOUNTER — Ambulatory Visit (INDEPENDENT_AMBULATORY_CARE_PROVIDER_SITE_OTHER): Payer: Medicare HMO | Admitting: Family Medicine

## 2019-04-27 ENCOUNTER — Encounter: Payer: Self-pay | Admitting: Family Medicine

## 2019-04-27 VITALS — BP 139/71 | Temp 98.4°F

## 2019-04-27 DIAGNOSIS — M79673 Pain in unspecified foot: Secondary | ICD-10-CM

## 2019-04-27 DIAGNOSIS — R29898 Other symptoms and signs involving the musculoskeletal system: Secondary | ICD-10-CM

## 2019-04-27 DIAGNOSIS — F411 Generalized anxiety disorder: Secondary | ICD-10-CM | POA: Diagnosis not present

## 2019-04-27 DIAGNOSIS — I10 Essential (primary) hypertension: Secondary | ICD-10-CM

## 2019-04-27 DIAGNOSIS — K2100 Gastro-esophageal reflux disease with esophagitis, without bleeding: Secondary | ICD-10-CM

## 2019-04-27 MED ORDER — ALPRAZOLAM 0.25 MG PO TABS: 0.2500 mg | ORAL_TABLET | Freq: Every day | ORAL | 2 refills | Status: DC | PRN

## 2019-04-27 MED ORDER — HYDROCHLOROTHIAZIDE 12.5 MG PO CAPS: 12.5000 mg | ORAL_CAPSULE | Freq: Every day | ORAL | 1 refills | Status: DC

## 2019-04-27 NOTE — Progress Notes (Signed)
Virtual Visit via Telephone Note  I connected with Tonya [name] on 04/27/19 at  2:00 PM EST by telephone and verified that I am speaking with the correct person using two identifiers.   I discussed the limitations, risks, security and privacy concerns of performing an evaluation and management service by telephone and the availability of in person appointments. I also discussed with the patient that there may be a patient responsible charge related to this service. The patient expressed understanding and agreed to proceed.    Established Patient Office Visit  Subjective:  Patient ID: Tonya [name], female    DOB: 02-21-1927  Age: 85 y.o. MRN: [id number]  CC:  Chief Complaint  Patient presents with  . mood    HPI Tonya [name] presents for   She reports that she has been doing well overall. She said that she didn't understand why she was having so much trouble with getting her xanax refilled. I informed her that due to federal regulations it's necessary for her to be seen for her refills and that she also has to have her blood work checked to ensure that the medications are not adversely affecting her kidneys.  She has overall she is doing well.  She says she does not take it every day.  She tries to take a half a tab but some days she feels like she needs to take a whole tab because her anxiety levels are so high.  She has been stuck in the house for the last 8 months.  She is really only gone out a couple of times for doctors office visits and that is it.  She has not been able to get out and walk like she would want to because she has been having toe pain and problem with pain in her foot she has been diagnosed with plantar fasciitis and says she is been to like 3 different podiatrist for it.  She has been doing stretches, icing and topical treatments without any significant relief.  Hypertension- Pt denies chest pain, SOB, dizziness, or heart palpitations.  Taking meds as directed  w/o problems.  Denies medication side effects.  She is currently on Lexapro.  She still takes xanax almost daily. Occ will split tab and taking half a tab BID.  Ports occasionally she will see blood pressures in the 140s but most of the time in the 130s.    Reflux-she does take her Nexium daily.  She occasionally will have cough but this is chronic and says she actually notices that it is better when she takes her Zyrtec.    Past Medical History:  Diagnosis Date  . Anxiety   . Cystocele   . Degenerative disc disease   . Hypertension   . IBS (irritable bowel syndrome)   . Rectocele     Past Surgical History:  Procedure Laterality Date  . ABDOMINAL HYSTERECTOMY     partial  . CHOLECYSTECTOMY    . LAPAROSCOPIC APPENDECTOMY N/A 02/16/2018   Procedure: APPENDECTOMY LAPAROSCOPIC;  Surgeon: Romie Levee, MD;  Location: [facility];  Service: General;  Laterality: N/A;    Family History  Problem Relation Age of Onset  . Depression Unknown   . GER disease Son   . Depression Son     Social History   Socioeconomic History  . Marital status: Widowed    Spouse name: Not on file  . Number of children: Not on file  . Years of education: Not on file  .  Highest education level: Not on file  Occupational History  . Not on file  Social Needs  . Financial resource strain: Not on file  . Food insecurity    Worry: Not on file    Inability: Not on file  . Transportation needs    Medical: Not on file    Non-medical: Not on file  Tobacco Use  . Smoking status: Never Smoker  . Smokeless tobacco: Never Used  Substance and Sexual Activity  . Alcohol use: No  . Drug use: No  . 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  Lifestyle  . Physical activity    Days per week: Not on file    Minutes per session: Not on file  . Stress: Not on file  Relationships  . Social Musician on  phone: Not on file    Gets together: Not on file    Attends religious service: Not on file    Active member of club or organization: Not on file    Attends meetings of clubs or organizations: Not on file    Relationship status: Not on file  . Intimate partner violence    Fear of current or ex partner: Not on file    Emotionally abused: Not on file    Physically abused: Not on file    Forced sexual activity: Not on file  Other Topics Concern  . Not on file  Social History Narrative  . Not on file    Outpatient Medications Prior to Visit  Medication Sig Dispense Refill  . escitalopram (LEXAPRO) 10 MG tablet TAKE 1 TABLET BY MOUTH EVERY DAY 90 tablet 1  . esomeprazole (NEXIUM) 40 MG capsule TAKE 1 CAPSULE BY MOUTH DAILY AT NOON 90 capsule 1  . ALPRAZolam (XANAX) 0.25 MG tablet TAKE 1 TABLET BY MOUTH DAILY AS NEEDED FOR ANXIETY 30 tablet 0  . hydrochlorothiazide (MICROZIDE) 12.5 MG capsule Take 1 capsule (12.5 mg total) by mouth daily. LABS REQUIRED FOR REFILLS 30 capsule 0   No facility-administered medications prior to visit.     Allergies  Allergen Reactions  . Amlodipine Other (See Comments)    Ankle swelling.   . Codeine     Other reaction(s): Unknown  . Tramadol Nausea Only    ROS Review of Systems    Objective:    Physical Exam  Constitutional: She is oriented to person, place, and time.  Pulmonary/Chest: Effort normal.  Neurological: She is alert and oriented to person, place, and time.  Psychiatric: She has a normal mood and affect. Her behavior is normal.    BP 139/71   Temp 98.4 F (36.9 C)  Wt Readings from Last 3 Encounters:  09/25/18 166 lb (75.3 kg)  04/28/18 166 lb (75.3 kg)  03/17/18 162 lb (73.5 kg)     There are no preventive care reminders to display for this patient.  There are no preventive care reminders to display for this patient.  Lab Results  Component Value Date   TSH 1.86 09/02/2015   Lab Results  Component Value Date   WBC  10.4 02/16/2018   HGB 13.6 02/16/2018   HCT 40.0 02/16/2018   MCV 89.9 02/16/2018   PLT 198 02/16/2018   Lab Results  Component Value Date   NA 140 02/16/2018   K 3.0 (L) 02/16/2018   CO2 28 02/16/2018   GLUCOSE 128 (H) 02/16/2018  BUN 19 02/16/2018   CREATININE 0.98 02/16/2018   BILITOT 1.7 (H) 02/16/2018   ALKPHOS 63 02/16/2018   AST 18 02/16/2018   ALT 12 02/16/2018   PROT 6.4 (L) 02/16/2018   ALBUMIN 3.6 02/16/2018   CALCIUM 8.3 (L) 02/16/2018   ANIONGAP 12 02/16/2018   Lab Results  Component Value Date   CHOL 180 09/03/2017   Lab Results  Component Value Date   HDL 50 (L) 09/03/2017   Lab Results  Component Value Date   LDLCALC 108 (H) 09/03/2017   Lab Results  Component Value Date   TRIG 113 09/03/2017   Lab Results  Component Value Date   CHOLHDL 3.6 09/03/2017   No results found for: HGBA1C    Assessment & Plan:   Problem List Items Addressed This Visit      Cardiovascular and Mediastinum   HYPERTENSION, BENIGN - Primary    Home blood pressures overall well controlled mostly in the 130s occasionally in the 140s.  Cardiologist continue to keep an eye on this.      Relevant Medications   hydrochlorothiazide (MICROZIDE) 12.5 MG capsule   Other Relevant Orders   COMPLETE METABOLIC PANEL WITH GFR   Lipid panel     Digestive   Gastroesophageal reflux disease with esophagitis    Continuing daily Nexium.        Other   ANXIETY DISORDER, GENERALIZED    Continue Lexapro with as needed use of alprazolam for panic attacks.  This reminded her once again that our goal is to get her off of the medication and using it very infrequently such as once or twice a week and not daily.  Did go ahead and send refills today.      Relevant Medications   ALPRAZolam (XANAX) 0.25 MG tablet    Other Visit Diagnoses    Pain of foot, unspecified laterality       Weakness of both lower extremities          Foot pain-this is unfortunately kept her from walking  and exercising like she normally would.  She plans on getting in for another consultation [facility].  She says that part of the foot actually looks a little red which is a little unusual for plantar fasciitis.  Did encourage her to work on doing chair exercises to help maintain her strength since she is not able to do a lot of walking.  I am very concerned about her becoming more deconditioned.  Lower extremity weakness-she says she felt like she gained some strength and balance when she was getting home PT but has not been able to do her walking like she was previously so just encouraged her to get up and move and even do chair exercises to Maintain her strength.  Meds ordered this encounter  Medications  . ALPRAZolam (XANAX) 0.25 MG tablet    Sig: Take 1 tablet (0.25 mg total) by mouth daily as needed. for anxiety    Dispense:  30 tablet    Refill:  2  . hydrochlorothiazide (MICROZIDE) 12.5 MG capsule    Sig: Take 1 capsule (12.5 mg total) by mouth daily.    Dispense:  90 capsule    Refill:  1   She declines flu vaccine.  Follow-up: Return in about 6 months (around 10/25/2019) for mood and BP.     I discussed the assessment and treatment plan with the patient. The patient was provided an  opportunity to ask questions and all were answered. The patient agreed with the plan and demonstrated an understanding of the instructions.   The patient was advised to call back or seek an in-person evaluation if the symptoms worsen or if the condition fails to improve as anticipated.  I provided 28 minutes of non-face-to-face time during this encounter.   Beatrice Lecher, MD   Beatrice Lecher, MD

## 2019-04-27 NOTE — Assessment & Plan Note (Signed)
Home blood pressures overall well controlled mostly in the 130s occasionally in the 140s.  Cardiologist continue to keep an eye on this.

## 2019-04-27 NOTE — Assessment & Plan Note (Signed)
Continue Lexapro with as needed use of alprazolam for panic attacks.  This reminded her once again that our goal is to get her off of the medication and using it very infrequently such as once or twice a week and not daily.  Did go ahead and send refills today.

## 2019-04-27 NOTE — Progress Notes (Signed)
She reports that she has been doing well overall. She said that she didn't understand why she was having so much trouble with getting her xanax refilled. I informed her that due to federal regulations it's necessary for her to be seen for her refills and that she also has to have her blood work checked to ensure that the medications are not adversely affecting her kidneys. She stated that she didn't know that she was supposed to f/u because she wasn't told. I then reminded her that she was to have f/u in October for her medications and labs. She voiced understanding.

## 2019-04-27 NOTE — Assessment & Plan Note (Signed)
Continuing daily Nexium.

## 2019-04-28 DIAGNOSIS — I10 Essential (primary) hypertension: Secondary | ICD-10-CM | POA: Diagnosis not present

## 2019-04-28 LAB — COMPLETE METABOLIC PANEL WITH GFR
AG Ratio: 1.8 (calc) (ref 1.0–2.5)
ALT: 14 U/L (ref 6–29)
AST: 20 U/L (ref 10–35)
Albumin: 4.2 g/dL (ref 3.6–5.1)
Alkaline phosphatase (APISO): 54 U/L (ref 37–153)
BUN/Creatinine Ratio: 17 (calc) (ref 6–22)
BUN: 17 mg/dL (ref 7–25)
CO2: 34 mmol/L — ABNORMAL HIGH (ref 20–32)
Calcium: 9.4 mg/dL (ref 8.6–10.4)
Chloride: 101 mmol/L (ref 98–110)
Creat: 0.98 mg/dL — ABNORMAL HIGH (ref 0.60–0.88)
GFR, Est African American: 59 mL/min/{1.73_m2} — ABNORMAL LOW (ref >=60)
GFR, Est Non African American: 51 mL/min/{1.73_m2} — ABNORMAL LOW (ref >=60)
Globulin: 2.4 g/dL (calc) (ref 1.9–3.7)
Glucose, Bld: 84 mg/dL (ref 65–99)
Potassium: 3.8 mmol/L (ref 3.5–5.3)
Sodium: 142 mmol/L (ref 135–146)
Total Bilirubin: 1.1 mg/dL (ref 0.2–1.2)
Total Protein: 6.6 g/dL (ref 6.1–8.1)

## 2019-04-28 LAB — LIPID PANEL
Cholesterol: 192 mg/dL (ref <200)
HDL: 56 mg/dL (ref >=50)
LDL Cholesterol (Calc): 112 mg/dL (calc) — ABNORMAL HIGH
Non-HDL Cholesterol (Calc): 136 mg/dL (calc) — ABNORMAL HIGH (ref <130)
Total CHOL/HDL Ratio: 3.4 (calc) (ref <5.0)
Triglycerides: 126 mg/dL (ref <150)

## 2019-05-07 DIAGNOSIS — M170 Bilateral primary osteoarthritis of knee: Secondary | ICD-10-CM | POA: Diagnosis not present

## 2019-06-22 ENCOUNTER — Other Ambulatory Visit: Payer: Self-pay | Admitting: Family Medicine

## 2019-07-23 ENCOUNTER — Other Ambulatory Visit: Payer: Self-pay

## 2019-07-23 DIAGNOSIS — F411 Generalized anxiety disorder: Secondary | ICD-10-CM

## 2019-07-23 NOTE — Telephone Encounter (Signed)
[name] states she needs a refill on Xanax. She states this COVID-19 thing is keeping her anxious.

## 2019-07-23 NOTE — Telephone Encounter (Signed)
Dr. Linford Arnold,  Patient requesting refills of Xanax but looking at the last note, she was notified that she was required to have an appointment to get her refills. Please advise if you would like her to do an appointment or if I can go ahead with this refill request.   Thanks, Christen Butter, FNP

## 2019-07-24 MED ORDER — ALPRAZOLAM 0.25 MG PO TABS: 0.2500 mg | ORAL_TABLET | Freq: Every day | ORAL | 1 refills | Status: DC | PRN

## 2019-08-03 IMAGING — CT CT ABD-PELV W/ CM
2 of 5 series · 15 of 46 positions shown, 17 images · IV contrast (APPLIED)
Comparison: Chest radiograph dated 10/15/2017 and lumbar spine
radiograph dated 07/05/2014 and CT of the abdomen pelvis dated
05/31/2008

CLINICAL DATA: 89-year-old female with abdominal pain.

EXAM:
CT ABDOMEN AND PELVIS WITH CONTRAST
TECHNIQUE: Multidetector CT imaging of the abdomen and pelvis was performed
using the standard protocol following bolus administration of
intravenous contrast.
CONTRAST:  100mL TN8T8G-I88 IOPAMIDOL (TN8T8G-I88) INJECTION 61%

[Series 2: axial st · axial · 0.72mm/px · z∈[-78,+292]mm · 12 of 86 slices shown, 14 images]
[im 6/86  soft-tissue]
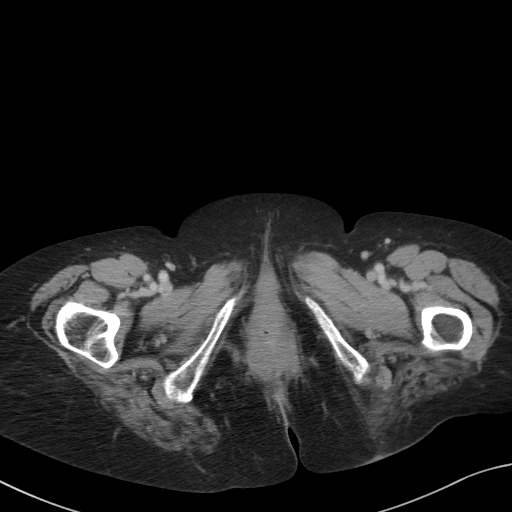
[im 6/86  bone]
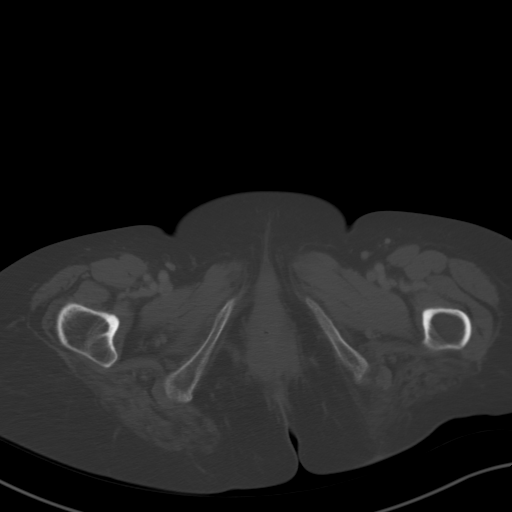
[im 11/86  soft-tissue]
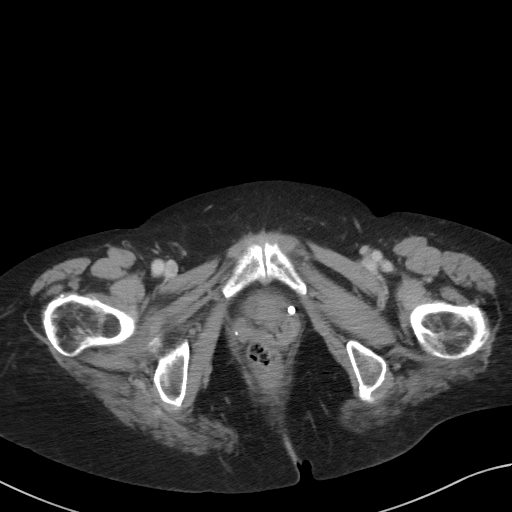
[im 22/86  soft-tissue]
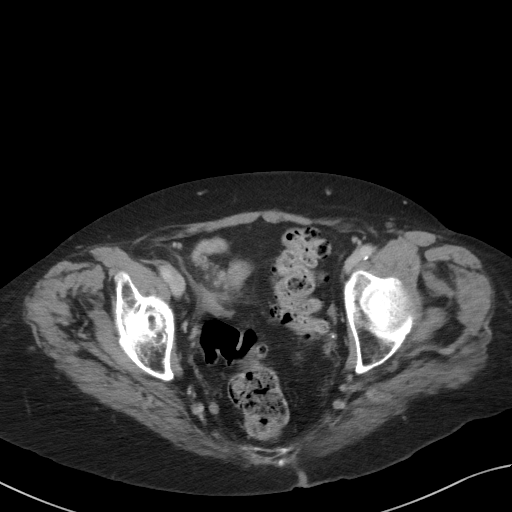
[im 27/86  soft-tissue]
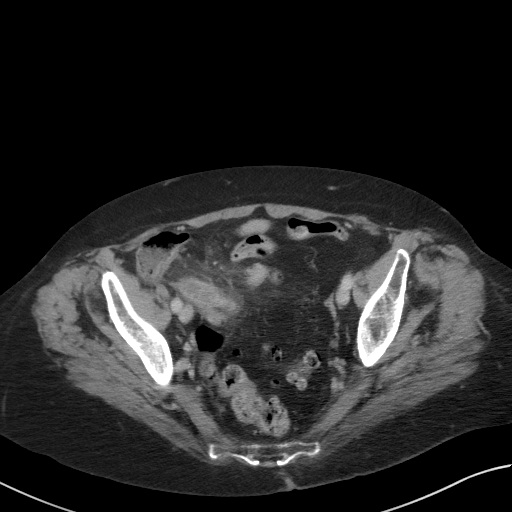
[im 32/86  soft-tissue]
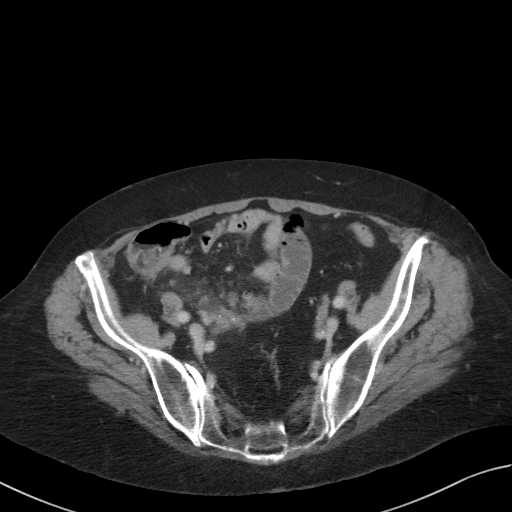
[im 38/86  soft-tissue]
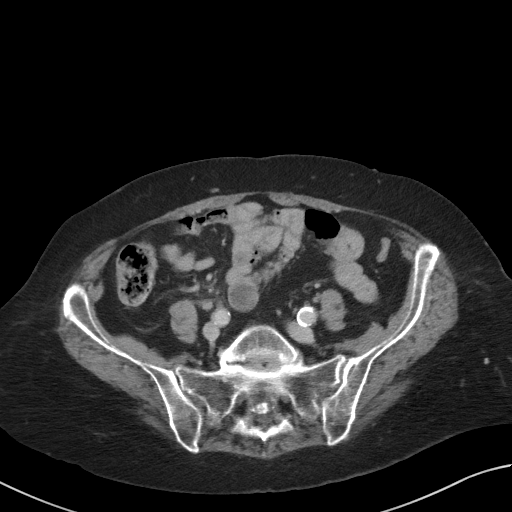
[im 48/86  soft-tissue]
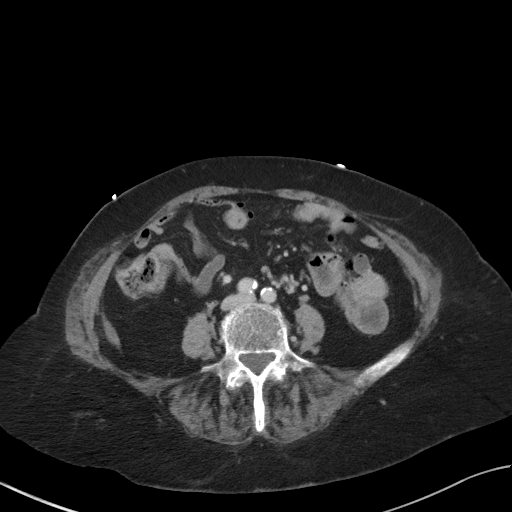
[im 54/86  soft-tissue]
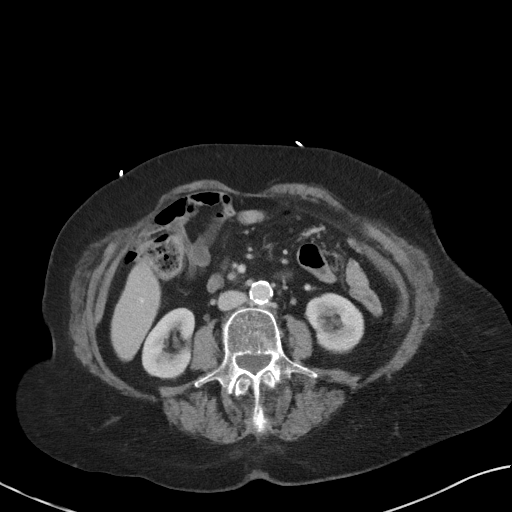
[im 59/86  soft-tissue]
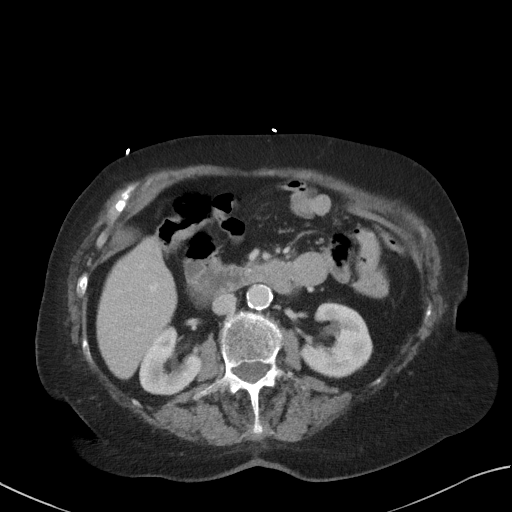
[im 59/86  bone]
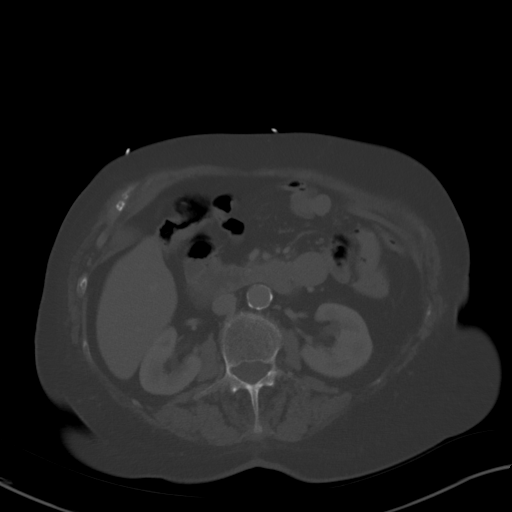
[im 64/86  soft-tissue]
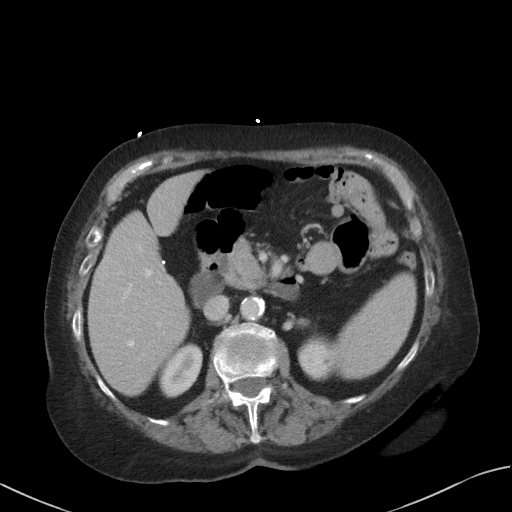
[im 75/86  soft-tissue]
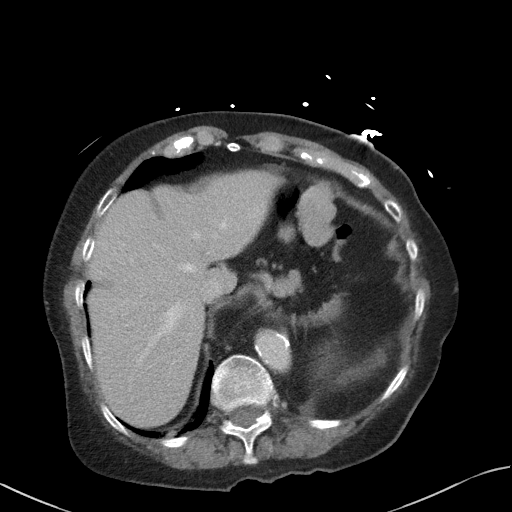
[im 80/86  soft-tissue]
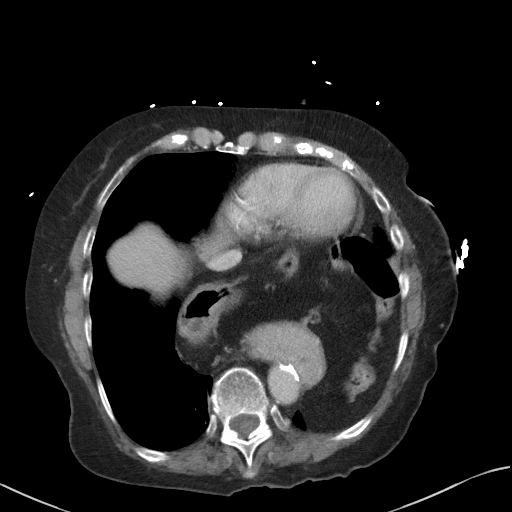

[Series 5: coronal st · coronal · 0.66mm/px · 3 of 101 slices shown]
[im 34/101  soft-tissue]
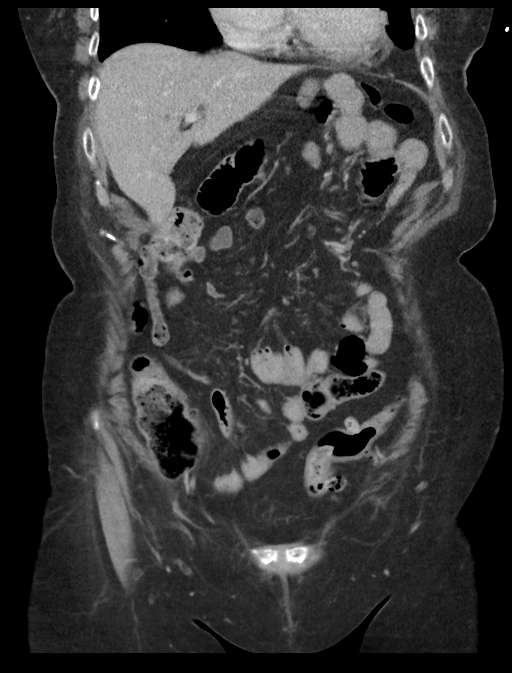
[im 45/101  soft-tissue]
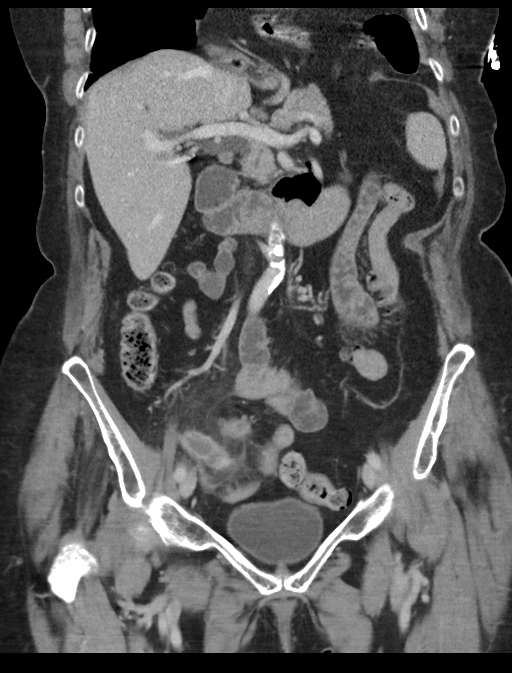
[im 56/101  soft-tissue]
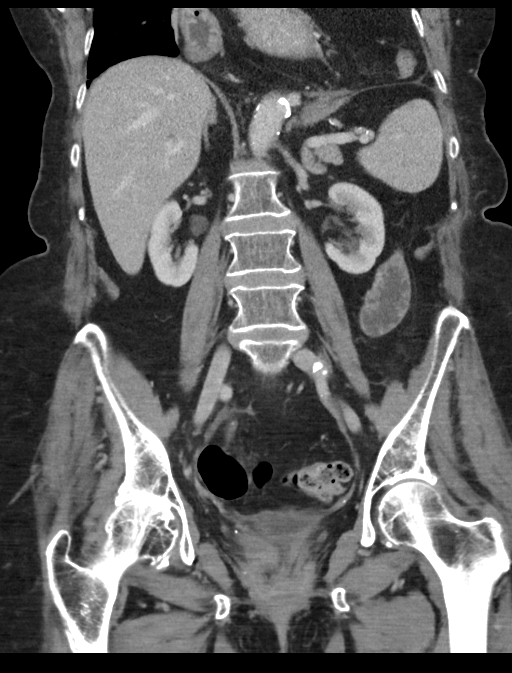

[15 of 46 positions shown; findings below may reference images not displayed]

FINDINGS: Lower chest: Atelectatic changes of the left lung base secondary to
large hiatal hernia. The right lung base is clear as visualized.
There is Mccloud vascular calcification.

No intra-abdominal free air or free fluid.

Hepatobiliary: Cholecystectomy. Mild intrahepatic biliary ductal
dilatation. The liver is unremarkable.

Pancreas: There is a 5 mm hypodense focus in the distal body of the
pancreas grossly stable compared to the study of 8774 likely a
benign or indolent etiology such as a side branch IPMN. The pancreas
is otherwise unremarkable.

Spleen: Normal in size without focal abnormality.

Adrenals/Urinary Tract: The adrenal glands are unremarkable. The
kidneys, visualized ureters, and urinary bladder appear
unremarkable. There is symmetric enhancement and excretion of
contrast by both kidneys.

Stomach/Bowel: There is extensive sigmoid diverticulosis without
active inflammatory changes. No bowel obstruction. There is a large
hiatal hernia containing the stomach and a segment of the transverse
colon. There is a 3 cm distal duodenal diverticulum and a 2 cm
posterior diverticulum of the proximal duodenum. There are scattered
small bowel diverticulosis. The appendix is enlarged and inflamed
measuring up to 16 mm in diameter. There is a 15 mm stone in the
distal lumen of the appendix or within a locally contained
perforation or abscess adjacent to the tip of the appendix.
Additional 2 mm stone is also noted which may be within a contained
perforation/collection adjacent to the tip of the appendix. The
appendix is located in the right lower quadrant medial to the cecum
extending inferior and medially into the right hemipelvis.

Vascular/Lymphatic: Advanced aortoiliac atherosclerotic disease. No
portal venous gas. There is no adenopathy.

Reproductive: Hysterectomy.  The ovaries are grossly unremarkable.

Other: Small fat containing inguinal hernias.

Musculoskeletal: Degenerative changes and disc desiccation at L5-S1.
Advanced osteopenia. Age indeterminate probable old fracture of the
S1-S2. Clinical correlation is recommended.
IMPRESSION: 1. Acute appendicitis. Multiple appendicoliths in the distal lumen
of the appendix or within a focally contained collection adjacent to
the tip of the appendix.
2. Extensive colonic diverticulosis as well as scattered small bowel
diverticula. No associated inflammatory changes. No bowel
obstruction.
3. Age indeterminate fracture of the S1-S2. Clinical correlation is
recommended.
4. A 5 mm distal pancreatic hypodense lesion grossly similar to the
study of 8774, likely a side branch IPMN.
5.  Aortic Atherosclerosis ([id number]).

## 2019-08-04 ENCOUNTER — Telehealth (INDEPENDENT_AMBULATORY_CARE_PROVIDER_SITE_OTHER): Payer: Medicare HMO | Admitting: Family Medicine

## 2019-08-04 ENCOUNTER — Encounter: Payer: Self-pay | Admitting: Family Medicine

## 2019-08-04 VITALS — Temp 98.7°F

## 2019-08-04 DIAGNOSIS — J019 Acute sinusitis, unspecified: Secondary | ICD-10-CM

## 2019-08-04 MED ORDER — AZITHROMYCIN 250 MG PO TABS: ORAL_TABLET | ORAL | 0 refills | Status: AC

## 2019-08-04 MED ORDER — BENZONATATE 200 MG PO CAPS: 200.0000 mg | ORAL_CAPSULE | Freq: Two times a day (BID) | ORAL | 0 refills | Status: DC | PRN

## 2019-08-04 NOTE — Progress Notes (Signed)
Virtual Visit via Telephone Note  I connected with [place] on 08/04/19 at  2:20 PM EST by telephone and verified that I am speaking with the correct person using two identifiers.   I discussed the limitations, risks, security and privacy concerns of performing an evaluation and management service by telephone and the availability of in person appointments. I also discussed with the patient that there may be a patient responsible charge related to this service. The patient expressed understanding and agreed to proceed.  Patient location: Provider loccation: In office   Subjective:    CC: Sick x 9 days.   HPI: She says that she had a family member come in the home who was sick.  And then she feels like that is where she got it.  For the last 10 days she has been coughing she is had a runny nose as well as significant congestion and mucus and phlegm.  She denies any shortness of breath she denies any fever or chills.  She has not really had any body aches.  No recent loss of taste or smell.  She says now she is starting to get coughing fits she has been using Robitussin-DM and running her home humidifier she has been trying drink plenty of fluid she has not had any diarrhea she had a little bit of soreness in her stomach muscles but she really feels like that is just from coughing.  No significant headache.  She is also been taking some vitamin C and D.   Past medical history, Surgical history, Family history not pertinant except as noted below, Social history, Allergies, and medications have been entered into the medical record, reviewed, and corrections made.   Review of Systems: No fevers, chills, night sweats, weight loss, chest pain, or shortness of breath.   Objective:    General: Speaking clearly in complete sentences without any shortness of breath.  Alert and oriented x3.  Normal judgment. No apparent acute distress.    Impression and Recommendations:    Acute  sinusitis-we will treat with azithromycin and Tessalon Perles.  If not improving then consider further work-up including chest x-ray.  It sounds like she is not short of breath so I think she is low risk for Covid overall but we did discuss the possibility of getting her tested for that as well.  Continue symptomatic care with running humidifier, drinking fluids and using her Robitussin.     I discussed the assessment and treatment plan with the patient. The patient was provided an opportunity to ask questions and all were answered. The patient agreed with the plan and demonstrated an understanding of the instructions.   The patient was advised to call back or seek an in-person evaluation if the symptoms worsen or if the condition fails to improve as anticipated.  I provided 20 minutes of non-face-to-face time during this encounter.   [name], MD

## 2019-08-04 NOTE — Progress Notes (Signed)
No answer

## 2019-08-10 ENCOUNTER — Telehealth: Payer: Self-pay

## 2019-08-10 DIAGNOSIS — R059 Cough, unspecified: Secondary | ICD-10-CM

## 2019-08-10 DIAGNOSIS — R05 Cough: Secondary | ICD-10-CM

## 2019-08-10 NOTE — Telephone Encounter (Signed)
[name] states she still has a lingering cough. She wanted to know if she should have a chest x-ray.

## 2019-08-10 NOTE — Telephone Encounter (Signed)
Yes let get CXR. Order placed. She can go anytime

## 2019-08-11 NOTE — Telephone Encounter (Signed)
08/11/2019  Pt informed.  Pt expressed understanding and is agreeable.  [name], CMA

## 2019-08-11 NOTE — Telephone Encounter (Signed)
08/11/2019  Attempted to contact pt.  Line was busy.  [name], CMA

## 2019-08-18 ENCOUNTER — Ambulatory Visit (INDEPENDENT_AMBULATORY_CARE_PROVIDER_SITE_OTHER): Payer: Medicare HMO

## 2019-08-18 ENCOUNTER — Other Ambulatory Visit: Payer: Self-pay

## 2019-08-18 DIAGNOSIS — R05 Cough: Secondary | ICD-10-CM

## 2019-08-18 DIAGNOSIS — R059 Cough, unspecified: Secondary | ICD-10-CM

## 2019-09-11 ENCOUNTER — Other Ambulatory Visit: Payer: Self-pay | Admitting: *Deleted

## 2019-09-11 DIAGNOSIS — F411 Generalized anxiety disorder: Secondary | ICD-10-CM

## 2019-09-11 MED ORDER — ALPRAZOLAM 0.25 MG PO TABS: 0.2500 mg | ORAL_TABLET | Freq: Every day | ORAL | 1 refills | Status: DC | PRN

## 2019-09-25 ENCOUNTER — Telehealth: Payer: Self-pay | Admitting: Family Medicine

## 2019-09-25 NOTE — Telephone Encounter (Signed)
PT is still having chronic cough symptoms. I have her scheduled 09/29/19. Would you like this to be in office or virtual.  Please Advise so I can call the PT back.

## 2019-09-28 NOTE — Telephone Encounter (Signed)
OK for in person

## 2019-09-29 ENCOUNTER — Telehealth: Payer: Medicare HMO | Admitting: Family Medicine

## 2019-09-30 NOTE — Telephone Encounter (Signed)
Patient cancelled appointment.

## 2019-10-05 DIAGNOSIS — M170 Bilateral primary osteoarthritis of knee: Secondary | ICD-10-CM | POA: Diagnosis not present

## 2019-10-20 ENCOUNTER — Other Ambulatory Visit: Payer: Self-pay | Admitting: Family Medicine

## 2019-10-27 ENCOUNTER — Other Ambulatory Visit: Payer: Self-pay | Admitting: *Deleted

## 2019-10-27 DIAGNOSIS — L859 Epidermal thickening, unspecified: Secondary | ICD-10-CM | POA: Diagnosis not present

## 2019-10-27 DIAGNOSIS — L603 Nail dystrophy: Secondary | ICD-10-CM | POA: Diagnosis not present

## 2019-10-27 DIAGNOSIS — F411 Generalized anxiety disorder: Secondary | ICD-10-CM

## 2019-10-27 DIAGNOSIS — L608 Other nail disorders: Secondary | ICD-10-CM | POA: Diagnosis not present

## 2019-10-27 DIAGNOSIS — B351 Tinea unguium: Secondary | ICD-10-CM | POA: Diagnosis not present

## 2019-10-27 DIAGNOSIS — M722 Plantar fascial fibromatosis: Secondary | ICD-10-CM | POA: Diagnosis not present

## 2019-10-27 DIAGNOSIS — I10 Essential (primary) hypertension: Secondary | ICD-10-CM

## 2019-10-27 DIAGNOSIS — L858 Other specified epidermal thickening: Secondary | ICD-10-CM | POA: Diagnosis not present

## 2019-10-27 DIAGNOSIS — M79609 Pain in unspecified limb: Secondary | ICD-10-CM | POA: Diagnosis not present

## 2019-10-27 MED ORDER — HYDROCHLOROTHIAZIDE 12.5 MG PO CAPS: 12.5000 mg | ORAL_CAPSULE | Freq: Every day | ORAL | 0 refills | Status: DC

## 2019-11-06 ENCOUNTER — Telehealth: Payer: Self-pay | Admitting: Family Medicine

## 2019-11-06 NOTE — Telephone Encounter (Signed)
Called and advised that it would be ok for her to take 81 mg aspirin she would just need to d/c if she has any GI upset. She voiced understanding.

## 2019-11-06 NOTE — Telephone Encounter (Signed)
Pt called and left a voicemail stating that her foot doctor told her to take 1 baby aspirin a day and she wants to check with Dr. Linford Arnold and make sure this is okay... Please advise pt

## 2019-11-17 ENCOUNTER — Telehealth: Payer: Self-pay | Admitting: Nurse Practitioner

## 2019-11-17 ENCOUNTER — Other Ambulatory Visit: Payer: Self-pay | Admitting: Family Medicine

## 2019-11-17 DIAGNOSIS — F411 Generalized anxiety disorder: Secondary | ICD-10-CM

## 2019-11-17 MED ORDER — ALPRAZOLAM 0.25 MG PO TABS: 0.2500 mg | ORAL_TABLET | Freq: Every day | ORAL | 2 refills | Status: DC | PRN

## 2019-11-17 NOTE — Telephone Encounter (Signed)
Refills sent for alprazolam.

## 2019-11-25 ENCOUNTER — Other Ambulatory Visit: Payer: Self-pay | Admitting: *Deleted

## 2019-11-25 DIAGNOSIS — I10 Essential (primary) hypertension: Secondary | ICD-10-CM

## 2019-11-25 MED ORDER — HYDROCHLOROTHIAZIDE 12.5 MG PO CAPS: 12.5000 mg | ORAL_CAPSULE | Freq: Every day | ORAL | 0 refills | Status: DC

## 2019-12-03 ENCOUNTER — Telehealth (INDEPENDENT_AMBULATORY_CARE_PROVIDER_SITE_OTHER): Payer: Medicare HMO | Admitting: Family Medicine

## 2019-12-03 ENCOUNTER — Encounter: Payer: Self-pay | Admitting: Family Medicine

## 2019-12-03 ENCOUNTER — Other Ambulatory Visit: Payer: Self-pay

## 2019-12-03 VITALS — BP 122/80 | HR 80 | Temp 97.5°F

## 2019-12-03 DIAGNOSIS — R609 Edema, unspecified: Secondary | ICD-10-CM

## 2019-12-03 DIAGNOSIS — K2100 Gastro-esophageal reflux disease with esophagitis, without bleeding: Secondary | ICD-10-CM | POA: Diagnosis not present

## 2019-12-03 DIAGNOSIS — M25473 Effusion, unspecified ankle: Secondary | ICD-10-CM

## 2019-12-03 DIAGNOSIS — I10 Essential (primary) hypertension: Secondary | ICD-10-CM | POA: Diagnosis not present

## 2019-12-03 DIAGNOSIS — F411 Generalized anxiety disorder: Secondary | ICD-10-CM

## 2019-12-03 MED ORDER — HYDROCHLOROTHIAZIDE 12.5 MG PO CAPS: 12.5000 mg | ORAL_CAPSULE | Freq: Every day | ORAL | 1 refills | Status: DC

## 2019-12-03 NOTE — Assessment & Plan Note (Signed)
Continues to use Xanax 0.25 mg almost daily though some days she says she does not take it.  We actually just refilled it a couple of weeks ago.  She is also on Lexapro 10 mg and feels like it is actually working well.

## 2019-12-03 NOTE — Progress Notes (Signed)
Virtual Visit via Telephone Note  I connected with Tonya [name] on 12/03/19 at 10:10 AM EDT by telephone and verified that I am speaking with the correct person using two identifiers.   I discussed the limitations, risks, security and privacy concerns of performing an evaluation and management service by telephone and the availability of in person appointments. I also discussed with the patient that there may be a patient responsible charge related to this service. The patient expressed understanding and agreed to proceed.  Patient location: at home Provider loccation: In office   Subjective:    CC: HTN  HPI: Hypertension- Pt denies chest pain, SOB, dizziness, or heart palpitations.  Taking meds as directed w/o problems.  Denies medication side effects. Home BPS running 120-130s at home Ankles have been swelling some esp with sitting.    F/U Anxiety - Staying in the his last year has been stressful.  Tries to sit quietly to relax.   Son who lives with her was injured on the job.  This has been stressful for her and wearing her..    Cant walk without the walker these days.  She says she really does not go out side or up stairs without there being someone else in the house.  Her son does live with her but she is by herself during the day when he is at work.  Pt reports having some leg pain. She has seen a podiatrist about her feet (hammer toe). She said that he did some Cx on her toenails and heel scrapings. (FYI)   Past medical history, Surgical history, Family history not pertinant except as noted below, Social history, Allergies, and medications have been entered into the medical record, reviewed, and corrections made.   Review of Systems: No fevers, chills, night sweats, weight loss, chest pain, or shortness of breath.   Objective:    General: Speaking clearly in complete sentences without any shortness of breath.  Alert and oriented x3.  Normal judgment. No apparent acute  distress.    Impression and Recommendations:     HYPERTENSION, BENIGN Well controlled. Continue current regimen. Follow up in  78m o.  Continue hydrochlorothiazide.  She is due for BMP.  ANXIETY DISORDER, GENERALIZED Continues to use Xanax 0.25 mg almost daily though some days she says she does not take it.  We actually just refilled it a couple of weeks ago.  She is also on Lexapro 10 mg and feels like it is actually working well.  Dependent edema Work on not sitting too long and stretches to move the muscle tissues.  Due for BMP and UA.     I discussed the assessment and treatment plan with the patient. The patient was provided an opportunity to ask questions and all were answered. The patient agreed with the plan and demonstrated an understanding of the instructions.   The patient was advised to call back or seek an in-person evaluation if the symptoms worsen or if the condition fails to improve as anticipated.  I provided 25 minutes of non-face-to-face time during this encounter.   [name], MD

## 2019-12-03 NOTE — Progress Notes (Signed)
Pt reports having some leg pain. She has seen a podiatrist about her feet (hammer toe). She said that he did some Cx on her toenails and heel scrapings. (FYI)

## 2019-12-03 NOTE — Assessment & Plan Note (Signed)
Work on not sitting too long and stretches to move the muscle tissues.  Due for BMP and UA.

## 2019-12-03 NOTE — Assessment & Plan Note (Addendum)
Well controlled. Continue current regimen. Follow up in  74m o.  Continue hydrochlorothiazide.  She is due for BMP.

## 2019-12-09 DIAGNOSIS — I10 Essential (primary) hypertension: Secondary | ICD-10-CM | POA: Diagnosis not present

## 2019-12-09 DIAGNOSIS — M25473 Effusion, unspecified ankle: Secondary | ICD-10-CM | POA: Diagnosis not present

## 2019-12-10 LAB — BASIC METABOLIC PANEL WITH GFR
BUN/Creatinine Ratio: 14 (calc) (ref 6–22)
BUN: 13 mg/dL (ref 7–25)
CO2: 30 mmol/L (ref 20–32)
Calcium: 9.1 mg/dL (ref 8.6–10.4)
Chloride: 100 mmol/L (ref 98–110)
Creat: 0.94 mg/dL — ABNORMAL HIGH (ref 0.60–0.88)
GFR, Est African American: 62 mL/min/{1.73_m2} (ref >=60)
GFR, Est Non African American: 53 mL/min/{1.73_m2} — ABNORMAL LOW (ref >=60)
Glucose, Bld: 114 mg/dL — ABNORMAL HIGH (ref 65–99)
Potassium: 4.1 mmol/L (ref 3.5–5.3)
Sodium: 138 mmol/L (ref 135–146)

## 2019-12-16 ENCOUNTER — Other Ambulatory Visit: Payer: Self-pay | Admitting: Family Medicine

## 2020-01-11 DIAGNOSIS — M170 Bilateral primary osteoarthritis of knee: Secondary | ICD-10-CM | POA: Diagnosis not present

## 2020-01-13 DIAGNOSIS — M170 Bilateral primary osteoarthritis of knee: Secondary | ICD-10-CM | POA: Diagnosis not present

## 2020-02-15 ENCOUNTER — Other Ambulatory Visit: Payer: Self-pay | Admitting: Family Medicine

## 2020-02-15 DIAGNOSIS — F411 Generalized anxiety disorder: Secondary | ICD-10-CM

## 2020-03-17 ENCOUNTER — Other Ambulatory Visit: Payer: Self-pay | Admitting: Family Medicine

## 2020-03-17 DIAGNOSIS — F411 Generalized anxiety disorder: Secondary | ICD-10-CM

## 2020-03-25 ENCOUNTER — Encounter: Payer: Self-pay | Admitting: Family Medicine

## 2020-03-25 ENCOUNTER — Telehealth (INDEPENDENT_AMBULATORY_CARE_PROVIDER_SITE_OTHER): Payer: Medicare HMO | Admitting: Family Medicine

## 2020-03-25 VITALS — BP 126/71 | HR 68 | Temp 97.3°F

## 2020-03-25 DIAGNOSIS — R059 Cough, unspecified: Secondary | ICD-10-CM

## 2020-03-25 DIAGNOSIS — M79604 Pain in right leg: Secondary | ICD-10-CM

## 2020-03-25 DIAGNOSIS — R2689 Other abnormalities of gait and mobility: Secondary | ICD-10-CM | POA: Diagnosis not present

## 2020-03-25 DIAGNOSIS — F411 Generalized anxiety disorder: Secondary | ICD-10-CM

## 2020-03-25 DIAGNOSIS — M79605 Pain in left leg: Secondary | ICD-10-CM | POA: Diagnosis not present

## 2020-03-25 DIAGNOSIS — R209 Unspecified disturbances of skin sensation: Secondary | ICD-10-CM

## 2020-03-25 MED ORDER — PREDNISONE 20 MG PO TABS
20.0000 mg | ORAL_TABLET | Freq: Every day | ORAL | 0 refills | Status: DC
Start: 2020-03-25 — End: 2020-06-22

## 2020-03-25 NOTE — Progress Notes (Signed)
Virtual Visit via Telephone Note  I connected with [place] on 03/25/20 at 10:20 AM EDT by telephone and verified that I am speaking with the correct person using two identifiers.   I discussed the limitations, risks, security and privacy concerns of performing an evaluation and management service by telephone and the availability of in person appointments. I also discussed with the patient that there may be a patient responsible charge related to this service. The patient expressed understanding and agreed to proceed.  Patient location: at home  Provider loccation: In office   Subjective:    CC: cough  HPI: Pt reports that she has had this cough for years and it keeps coming and it just starts. She will take a zyrtec and it will calm it down but it will not completely stop it but it helps. She does feel that it is allergies.   She goes on the porch and after 10 mins she will begin to sneeze and cough. She stated that the mucus is clear. Denies any fevers, sweats,chills.no facial pain pressure slight headaches but not concerning. She stated that she is weak and exhausted..  She is concerned that she is weak from this. She is unable to get around without using her walker. Unable to take a bath without getting completely exhausted. Can't stand for very long to fix dinner bc will feel like she is getting weak.  She gets a throbbing pain in her legs at night, Can happen when she is walking.   Her anxiety has been really high as well. Son who lives with her just had shoulder surgery after a work injury  Past medical history, Surgical history, Family history not pertinant except as noted below, Social history, Allergies, and medications have been entered into the medical record, reviewed, and corrections made.   Review of Systems: No fevers, chills, night sweats, weight loss, chest pain, or shortness of breath.   Objective:    General: Speaking clearly in complete sentences without  any shortness of breath.  Alert and oriented x3.  Normal judgment. No apparent acute distress.    Impression and Recommendations:    Cough - likely from allergies.  She does get some relief with Zyrtec so we discussed taking it daily instead of just as needed also go to give her a low-dose of prednisone for about a week to have the allergies little harder and see if this helps.  If any point she feels like she is getting worse or develops fever or significant congestion or headache then please let us know.  No sick contacts.  Balance -  to do suspect some of this is to just also decreased activity and she also has pretty bad knee arthritis that is quite painful and sometimes limits her mobility.  I think she would benefit significantly from rehab for her balance and work on generalized strengthening of her lower extremities.  We will go ahead and place referral today she lives in [place] so we will schedule at the med center in [facility].   Leg pain/cold feet-I do think she probably needs to be evaluated for peripheral vascular disease.  No prior diagnosis.  We will schedule for ABIs again at [facility] location since this is closer for her.  Can just encourage her to work on being active and keep moving.  Anxiety-she want to know if she can go up on her alprazolam since she is been more stressed than usual she is already on  Lexapro 10 mg daily.  We discussed that we worked really hard for her to come down to once a day on the alprazolam and that I really do not want to go backwards on that more than happy to schedule her with someone to talk if that would be helpful she feels like she has good support from family so declines that option today.   I discussed the assessment and treatment plan with the patient. The patient was provided an opportunity to ask questions and all were answered. The patient agreed with the plan and demonstrated an understanding of the instructions.   The patient was  advised to call back or seek an in-person evaluation if the symptoms worsen or if the condition fails to improve as anticipated.  I provided 23 minutes of non-face-to-face time during this encounter.   [name], MD

## 2020-03-25 NOTE — Progress Notes (Signed)
Pt reports that she has had this cough for years and it keeps coming and it just starts. She will take a zyrtec and it will calm it down but it will not completely stop it but it helps. She does feel that it is allergies.   She goes on the porch and after 10 mins she will begin to sneeze and cough. She stated that the mucus is clear. Denies any fevers, sweats,chills.no facial pain pressure slight headaches but not concerning. She stated that she is weak and exhausted..  She is concerned that she is weak from this. She is unable to get around without using her walker. Unable to take a bath without getting completely exhausted.

## 2020-03-28 ENCOUNTER — Telehealth: Payer: Medicare HMO | Admitting: Family Medicine

## 2020-04-11 ENCOUNTER — Ambulatory Visit: Payer: Medicare HMO | Attending: Family Medicine | Admitting: Physical Therapy

## 2020-04-18 ENCOUNTER — Telehealth: Payer: Self-pay | Admitting: Family Medicine

## 2020-04-18 DIAGNOSIS — R2689 Other abnormalities of gait and mobility: Secondary | ICD-10-CM

## 2020-04-18 NOTE — Telephone Encounter (Signed)
Order placed for PT via home health

## 2020-04-25 ENCOUNTER — Telehealth: Payer: Self-pay

## 2020-04-25 NOTE — Telephone Encounter (Signed)
[facility] is going to start home health care tomorrow.

## 2020-04-26 DIAGNOSIS — M810 Age-related osteoporosis without current pathological fracture: Secondary | ICD-10-CM | POA: Diagnosis not present

## 2020-04-26 DIAGNOSIS — H8110 Benign paroxysmal vertigo, unspecified ear: Secondary | ICD-10-CM | POA: Diagnosis not present

## 2020-04-26 DIAGNOSIS — K863 Pseudocyst of pancreas: Secondary | ICD-10-CM | POA: Diagnosis not present

## 2020-04-26 DIAGNOSIS — I10 Essential (primary) hypertension: Secondary | ICD-10-CM | POA: Diagnosis not present

## 2020-04-26 DIAGNOSIS — M79605 Pain in left leg: Secondary | ICD-10-CM | POA: Diagnosis not present

## 2020-04-26 DIAGNOSIS — R209 Unspecified disturbances of skin sensation: Secondary | ICD-10-CM | POA: Diagnosis not present

## 2020-04-26 DIAGNOSIS — M79604 Pain in right leg: Secondary | ICD-10-CM | POA: Diagnosis not present

## 2020-04-26 DIAGNOSIS — M5430 Sciatica, unspecified side: Secondary | ICD-10-CM | POA: Diagnosis not present

## 2020-04-26 DIAGNOSIS — F411 Generalized anxiety disorder: Secondary | ICD-10-CM | POA: Diagnosis not present

## 2020-04-28 DIAGNOSIS — M5430 Sciatica, unspecified side: Secondary | ICD-10-CM | POA: Diagnosis not present

## 2020-04-28 DIAGNOSIS — M79604 Pain in right leg: Secondary | ICD-10-CM | POA: Diagnosis not present

## 2020-04-28 DIAGNOSIS — M79605 Pain in left leg: Secondary | ICD-10-CM | POA: Diagnosis not present

## 2020-04-28 DIAGNOSIS — H8110 Benign paroxysmal vertigo, unspecified ear: Secondary | ICD-10-CM | POA: Diagnosis not present

## 2020-04-28 DIAGNOSIS — K863 Pseudocyst of pancreas: Secondary | ICD-10-CM | POA: Diagnosis not present

## 2020-04-28 DIAGNOSIS — M810 Age-related osteoporosis without current pathological fracture: Secondary | ICD-10-CM | POA: Diagnosis not present

## 2020-04-28 DIAGNOSIS — R209 Unspecified disturbances of skin sensation: Secondary | ICD-10-CM | POA: Diagnosis not present

## 2020-04-28 DIAGNOSIS — I10 Essential (primary) hypertension: Secondary | ICD-10-CM | POA: Diagnosis not present

## 2020-04-28 DIAGNOSIS — F411 Generalized anxiety disorder: Secondary | ICD-10-CM | POA: Diagnosis not present

## 2020-05-02 ENCOUNTER — Ambulatory Visit (HOSPITAL_BASED_OUTPATIENT_CLINIC_OR_DEPARTMENT_OTHER): Payer: Medicare HMO

## 2020-05-03 DIAGNOSIS — M170 Bilateral primary osteoarthritis of knee: Secondary | ICD-10-CM | POA: Diagnosis not present

## 2020-05-04 DIAGNOSIS — K863 Pseudocyst of pancreas: Secondary | ICD-10-CM | POA: Diagnosis not present

## 2020-05-04 DIAGNOSIS — H8110 Benign paroxysmal vertigo, unspecified ear: Secondary | ICD-10-CM | POA: Diagnosis not present

## 2020-05-04 DIAGNOSIS — M810 Age-related osteoporosis without current pathological fracture: Secondary | ICD-10-CM | POA: Diagnosis not present

## 2020-05-04 DIAGNOSIS — M79605 Pain in left leg: Secondary | ICD-10-CM | POA: Diagnosis not present

## 2020-05-04 DIAGNOSIS — R209 Unspecified disturbances of skin sensation: Secondary | ICD-10-CM | POA: Diagnosis not present

## 2020-05-04 DIAGNOSIS — M79604 Pain in right leg: Secondary | ICD-10-CM | POA: Diagnosis not present

## 2020-05-04 DIAGNOSIS — I10 Essential (primary) hypertension: Secondary | ICD-10-CM | POA: Diagnosis not present

## 2020-05-04 DIAGNOSIS — F411 Generalized anxiety disorder: Secondary | ICD-10-CM | POA: Diagnosis not present

## 2020-05-04 DIAGNOSIS — M5430 Sciatica, unspecified side: Secondary | ICD-10-CM | POA: Diagnosis not present

## 2020-05-05 DIAGNOSIS — M810 Age-related osteoporosis without current pathological fracture: Secondary | ICD-10-CM | POA: Diagnosis not present

## 2020-05-16 ENCOUNTER — Other Ambulatory Visit: Payer: Self-pay | Admitting: Family Medicine

## 2020-05-16 DIAGNOSIS — R209 Unspecified disturbances of skin sensation: Secondary | ICD-10-CM | POA: Diagnosis not present

## 2020-05-16 DIAGNOSIS — M5430 Sciatica, unspecified side: Secondary | ICD-10-CM | POA: Diagnosis not present

## 2020-05-16 DIAGNOSIS — M79605 Pain in left leg: Secondary | ICD-10-CM | POA: Diagnosis not present

## 2020-05-16 DIAGNOSIS — H8110 Benign paroxysmal vertigo, unspecified ear: Secondary | ICD-10-CM | POA: Diagnosis not present

## 2020-05-16 DIAGNOSIS — F411 Generalized anxiety disorder: Secondary | ICD-10-CM

## 2020-05-16 DIAGNOSIS — K863 Pseudocyst of pancreas: Secondary | ICD-10-CM | POA: Diagnosis not present

## 2020-05-16 DIAGNOSIS — I10 Essential (primary) hypertension: Secondary | ICD-10-CM | POA: Diagnosis not present

## 2020-05-16 DIAGNOSIS — M810 Age-related osteoporosis without current pathological fracture: Secondary | ICD-10-CM | POA: Diagnosis not present

## 2020-05-16 DIAGNOSIS — M79604 Pain in right leg: Secondary | ICD-10-CM | POA: Diagnosis not present

## 2020-05-18 DIAGNOSIS — M5430 Sciatica, unspecified side: Secondary | ICD-10-CM | POA: Diagnosis not present

## 2020-05-18 DIAGNOSIS — R209 Unspecified disturbances of skin sensation: Secondary | ICD-10-CM | POA: Diagnosis not present

## 2020-05-18 DIAGNOSIS — M79604 Pain in right leg: Secondary | ICD-10-CM | POA: Diagnosis not present

## 2020-05-18 DIAGNOSIS — I10 Essential (primary) hypertension: Secondary | ICD-10-CM | POA: Diagnosis not present

## 2020-05-18 DIAGNOSIS — F411 Generalized anxiety disorder: Secondary | ICD-10-CM | POA: Diagnosis not present

## 2020-05-18 DIAGNOSIS — M79605 Pain in left leg: Secondary | ICD-10-CM | POA: Diagnosis not present

## 2020-05-18 DIAGNOSIS — K863 Pseudocyst of pancreas: Secondary | ICD-10-CM | POA: Diagnosis not present

## 2020-05-18 DIAGNOSIS — M810 Age-related osteoporosis without current pathological fracture: Secondary | ICD-10-CM | POA: Diagnosis not present

## 2020-05-18 DIAGNOSIS — H8110 Benign paroxysmal vertigo, unspecified ear: Secondary | ICD-10-CM | POA: Diagnosis not present

## 2020-05-23 DIAGNOSIS — K863 Pseudocyst of pancreas: Secondary | ICD-10-CM | POA: Diagnosis not present

## 2020-05-23 DIAGNOSIS — M5430 Sciatica, unspecified side: Secondary | ICD-10-CM | POA: Diagnosis not present

## 2020-05-23 DIAGNOSIS — M79605 Pain in left leg: Secondary | ICD-10-CM | POA: Diagnosis not present

## 2020-05-23 DIAGNOSIS — M79604 Pain in right leg: Secondary | ICD-10-CM | POA: Diagnosis not present

## 2020-05-23 DIAGNOSIS — R209 Unspecified disturbances of skin sensation: Secondary | ICD-10-CM | POA: Diagnosis not present

## 2020-05-23 DIAGNOSIS — F411 Generalized anxiety disorder: Secondary | ICD-10-CM | POA: Diagnosis not present

## 2020-05-23 DIAGNOSIS — I10 Essential (primary) hypertension: Secondary | ICD-10-CM | POA: Diagnosis not present

## 2020-05-23 DIAGNOSIS — M810 Age-related osteoporosis without current pathological fracture: Secondary | ICD-10-CM | POA: Diagnosis not present

## 2020-05-23 DIAGNOSIS — H8110 Benign paroxysmal vertigo, unspecified ear: Secondary | ICD-10-CM | POA: Diagnosis not present

## 2020-05-26 DIAGNOSIS — M5430 Sciatica, unspecified side: Secondary | ICD-10-CM | POA: Diagnosis not present

## 2020-05-26 DIAGNOSIS — R209 Unspecified disturbances of skin sensation: Secondary | ICD-10-CM | POA: Diagnosis not present

## 2020-05-26 DIAGNOSIS — M79605 Pain in left leg: Secondary | ICD-10-CM | POA: Diagnosis not present

## 2020-05-26 DIAGNOSIS — M79604 Pain in right leg: Secondary | ICD-10-CM | POA: Diagnosis not present

## 2020-05-26 DIAGNOSIS — F411 Generalized anxiety disorder: Secondary | ICD-10-CM | POA: Diagnosis not present

## 2020-05-26 DIAGNOSIS — H8110 Benign paroxysmal vertigo, unspecified ear: Secondary | ICD-10-CM | POA: Diagnosis not present

## 2020-05-26 DIAGNOSIS — M810 Age-related osteoporosis without current pathological fracture: Secondary | ICD-10-CM | POA: Diagnosis not present

## 2020-05-26 DIAGNOSIS — K863 Pseudocyst of pancreas: Secondary | ICD-10-CM | POA: Diagnosis not present

## 2020-05-26 DIAGNOSIS — I10 Essential (primary) hypertension: Secondary | ICD-10-CM | POA: Diagnosis not present

## 2020-06-01 DIAGNOSIS — M810 Age-related osteoporosis without current pathological fracture: Secondary | ICD-10-CM | POA: Diagnosis not present

## 2020-06-01 DIAGNOSIS — M5430 Sciatica, unspecified side: Secondary | ICD-10-CM | POA: Diagnosis not present

## 2020-06-01 DIAGNOSIS — H8110 Benign paroxysmal vertigo, unspecified ear: Secondary | ICD-10-CM | POA: Diagnosis not present

## 2020-06-01 DIAGNOSIS — I10 Essential (primary) hypertension: Secondary | ICD-10-CM | POA: Diagnosis not present

## 2020-06-01 DIAGNOSIS — M79604 Pain in right leg: Secondary | ICD-10-CM | POA: Diagnosis not present

## 2020-06-01 DIAGNOSIS — R209 Unspecified disturbances of skin sensation: Secondary | ICD-10-CM | POA: Diagnosis not present

## 2020-06-01 DIAGNOSIS — K863 Pseudocyst of pancreas: Secondary | ICD-10-CM | POA: Diagnosis not present

## 2020-06-01 DIAGNOSIS — F411 Generalized anxiety disorder: Secondary | ICD-10-CM | POA: Diagnosis not present

## 2020-06-01 DIAGNOSIS — M79605 Pain in left leg: Secondary | ICD-10-CM | POA: Diagnosis not present

## 2020-06-03 ENCOUNTER — Other Ambulatory Visit: Payer: Self-pay | Admitting: *Deleted

## 2020-06-03 DIAGNOSIS — I10 Essential (primary) hypertension: Secondary | ICD-10-CM

## 2020-06-03 MED ORDER — HYDROCHLOROTHIAZIDE 12.5 MG PO CAPS: 12.5000 mg | ORAL_CAPSULE | Freq: Every day | ORAL | 1 refills | Status: DC

## 2020-06-08 DIAGNOSIS — M79605 Pain in left leg: Secondary | ICD-10-CM | POA: Diagnosis not present

## 2020-06-08 DIAGNOSIS — M5430 Sciatica, unspecified side: Secondary | ICD-10-CM | POA: Diagnosis not present

## 2020-06-08 DIAGNOSIS — M79604 Pain in right leg: Secondary | ICD-10-CM | POA: Diagnosis not present

## 2020-06-08 DIAGNOSIS — H8110 Benign paroxysmal vertigo, unspecified ear: Secondary | ICD-10-CM | POA: Diagnosis not present

## 2020-06-08 DIAGNOSIS — I10 Essential (primary) hypertension: Secondary | ICD-10-CM | POA: Diagnosis not present

## 2020-06-08 DIAGNOSIS — F411 Generalized anxiety disorder: Secondary | ICD-10-CM | POA: Diagnosis not present

## 2020-06-08 DIAGNOSIS — R209 Unspecified disturbances of skin sensation: Secondary | ICD-10-CM | POA: Diagnosis not present

## 2020-06-08 DIAGNOSIS — M810 Age-related osteoporosis without current pathological fracture: Secondary | ICD-10-CM | POA: Diagnosis not present

## 2020-06-08 DIAGNOSIS — K863 Pseudocyst of pancreas: Secondary | ICD-10-CM | POA: Diagnosis not present

## 2020-06-15 ENCOUNTER — Other Ambulatory Visit: Payer: Self-pay

## 2020-06-15 ENCOUNTER — Other Ambulatory Visit: Payer: Self-pay | Admitting: Family Medicine

## 2020-06-15 DIAGNOSIS — F411 Generalized anxiety disorder: Secondary | ICD-10-CM

## 2020-06-15 DIAGNOSIS — M79604 Pain in right leg: Secondary | ICD-10-CM | POA: Diagnosis not present

## 2020-06-15 DIAGNOSIS — K863 Pseudocyst of pancreas: Secondary | ICD-10-CM | POA: Diagnosis not present

## 2020-06-15 DIAGNOSIS — I10 Essential (primary) hypertension: Secondary | ICD-10-CM | POA: Diagnosis not present

## 2020-06-15 DIAGNOSIS — M5430 Sciatica, unspecified side: Secondary | ICD-10-CM | POA: Diagnosis not present

## 2020-06-15 DIAGNOSIS — M79605 Pain in left leg: Secondary | ICD-10-CM | POA: Diagnosis not present

## 2020-06-15 DIAGNOSIS — H8110 Benign paroxysmal vertigo, unspecified ear: Secondary | ICD-10-CM | POA: Diagnosis not present

## 2020-06-15 DIAGNOSIS — M810 Age-related osteoporosis without current pathological fracture: Secondary | ICD-10-CM | POA: Diagnosis not present

## 2020-06-15 DIAGNOSIS — R209 Unspecified disturbances of skin sensation: Secondary | ICD-10-CM | POA: Diagnosis not present

## 2020-06-15 MED ORDER — ESCITALOPRAM OXALATE 10 MG PO TABS
10.0000 mg | ORAL_TABLET | Freq: Every day | ORAL | 1 refills | Status: DC
Start: 2020-06-15 — End: 2020-12-12

## 2020-06-16 NOTE — Telephone Encounter (Signed)
plesae call pt and let her know I did refill her alprazolam.  But I only filled it for 28 tabs this needs to last her 30 days which means that at a couple of days she needs to take a half a tab instead of a whole tab.  I know have been encouraging to start to really wean back.  We have had several conversations about this.  We really need to get her off of using this medication completely.

## 2020-06-22 NOTE — Telephone Encounter (Signed)
Called to advise pt of medication recommendations. When I informed her that there have been previous conversations regarding this medicine. She said that she doesn't recall the conversations or that those conversations were so long ago she doesn't recall.   She proceeded to tell me that she was really disgusted with medical. She said that she will soon be 87 years old and doesn't understand that if something is working why change this. She feels that it is unfair that medications are withheld from patients because they don't come in.   I explained to her that prescribing

## 2020-06-23 ENCOUNTER — Telehealth: Payer: Self-pay | Admitting: Family Medicine

## 2020-06-23 DIAGNOSIS — M170 Bilateral primary osteoarthritis of knee: Secondary | ICD-10-CM | POA: Diagnosis not present

## 2020-06-23 NOTE — Telephone Encounter (Signed)
Pt called. She wants to talk to Dr. Linford Arnold about a personal matter. She did not want to talk to anybody else.  Thank you

## 2020-06-24 ENCOUNTER — Other Ambulatory Visit: Payer: Self-pay | Admitting: Family Medicine

## 2020-06-24 DIAGNOSIS — F411 Generalized anxiety disorder: Secondary | ICD-10-CM | POA: Diagnosis not present

## 2020-06-24 DIAGNOSIS — M810 Age-related osteoporosis without current pathological fracture: Secondary | ICD-10-CM | POA: Diagnosis not present

## 2020-06-24 DIAGNOSIS — H8110 Benign paroxysmal vertigo, unspecified ear: Secondary | ICD-10-CM | POA: Diagnosis not present

## 2020-06-24 DIAGNOSIS — M79604 Pain in right leg: Secondary | ICD-10-CM | POA: Diagnosis not present

## 2020-06-24 DIAGNOSIS — K863 Pseudocyst of pancreas: Secondary | ICD-10-CM | POA: Diagnosis not present

## 2020-06-24 DIAGNOSIS — I10 Essential (primary) hypertension: Secondary | ICD-10-CM | POA: Diagnosis not present

## 2020-06-24 DIAGNOSIS — M5430 Sciatica, unspecified side: Secondary | ICD-10-CM | POA: Diagnosis not present

## 2020-06-24 DIAGNOSIS — M79605 Pain in left leg: Secondary | ICD-10-CM | POA: Diagnosis not present

## 2020-06-24 DIAGNOSIS — R209 Unspecified disturbances of skin sensation: Secondary | ICD-10-CM | POA: Diagnosis not present

## 2020-06-24 MED ORDER — ALPRAZOLAM 0.25 MG PO TABS: ORAL_TABLET | ORAL | 2 refills | Status: DC

## 2020-06-24 NOTE — Telephone Encounter (Signed)
Time if you get a chance can you give her a call and see what is going on I think she will probably open up to you.

## 2020-06-24 NOTE — Telephone Encounter (Signed)
Patient called back stating it is VERY URGENT for Dr Linford Arnold to give her a call as soon as she sees this message, she stated she didn't want a nurse or anyone calling her besides Dr Linford Arnold and didn't state what it was about. Just stated she called the nurse line after hours and really needed to speak to the provider asap. AM

## 2020-06-27 NOTE — Telephone Encounter (Signed)
Spoke with patients about what happened about the phone.  Will address further.  She was able to get her medication. RF sent to the pharmacy.

## 2020-06-28 ENCOUNTER — Other Ambulatory Visit: Payer: Self-pay | Admitting: *Deleted

## 2020-06-28 DIAGNOSIS — R209 Unspecified disturbances of skin sensation: Secondary | ICD-10-CM

## 2020-06-28 DIAGNOSIS — M79605 Pain in left leg: Secondary | ICD-10-CM

## 2020-06-28 DIAGNOSIS — M79604 Pain in right leg: Secondary | ICD-10-CM

## 2020-09-23 ENCOUNTER — Ambulatory Visit (INDEPENDENT_AMBULATORY_CARE_PROVIDER_SITE_OTHER): Payer: Medicare HMO | Admitting: Family Medicine

## 2020-09-23 VITALS — Ht 68.0 in | Wt 156.0 lb

## 2020-09-23 DIAGNOSIS — Z0000 Encounter for general adult medical examination without abnormal findings: Secondary | ICD-10-CM | POA: Diagnosis not present

## 2020-09-23 NOTE — Progress Notes (Signed)
MEDICARE ANNUAL WELLNESS VISIT  09/23/2020  Telephone Visit Disclaimer This Medicare AWV was conducted by telephone due to national recommendations for restrictions regarding the COVID-19 Pandemic (e.g. social distancing).  I verified, using two identifiers, that I am speaking with Tonya [name] or their authorized healthcare agent. I discussed the limitations, risks, security, and privacy concerns of performing an evaluation and management service by telephone and the potential availability of an in-person appointment in the future. The patient expressed understanding and agreed to proceed.  Location of Patient: Home Location of Provider (nurse):  In the office.  Subjective:    Tonya [name] is a 87 y.o. female patient of Metheney, Barbarann Ehlers, MD who had a Medicare Annual Wellness Visit today via telephone. Tonya [name] is Retired and lives with their son. she has 3 children. she reports that she is socially active and does interact with friends/family regularly. she is minimally physically active and enjoys reading and watching tv.  Patient Care Team: Agapito Games, MD as PCP - General  Advanced Directives 09/23/2020 02/16/2018 02/16/2018 10/31/2016 11/06/2013  Does Patient Have a Medical Advance Directive? Yes Yes Yes No Patient has advance directive, copy in chart  Type of Advance Directive Healthcare Power of [place];Living will Healthcare Power of [place];Living will Healthcare Power of [place];Living will - -  Does patient want to make changes to medical advance directive? No - Patient declined No - Patient declined - - -  Copy of Healthcare Power of Attorney in Chart? No - copy requested No - copy requested No - copy requested - -  Would patient like information on creating a medical advance directive? - - - No - Patient declined -    Hospital Utilization Over the Past 12 Months: # of hospitalizations or ER visits: 0 # of surgeries: 0  Review of Systems    Patient reports  that her overall health is unchanged compared to last year.  History obtained from chart review and the patient  Patient Reported Readings (BP, Pulse, CBG, Weight, etc) Weight 156 lbs Height 38f 8in  Pain Assessment Pain : 0-10 Pain Score: 4  Pain Type: Chronic pain Pain Location: Elbow Pain Descriptors / Indicators: Aching Pain Onset: More than a month ago Pain Frequency: Intermittent Pain Relieving Factors: Topical pain ointment  Pain Relieving Factors: Topical pain ointment  Current Medications & Allergies (verified) Allergies as of 09/23/2020      Reactions   Amlodipine Other (See Comments)   Ankle swelling.    Codeine    Other reaction(s): Unknown   Tramadol Nausea Only      Medication List       Accurate as of September 23, 2020 10:20 AM. If you have any questions, ask your nurse or doctor.        STOP taking these medications   triamcinolone acetonide 40 MG/ML injection (RADIOLOGY ONLY) Commonly known as: KENALOG-40     TAKE these medications   ALPRAZolam 0.25 MG tablet Commonly known as: XANAX TAKE 1 TABLET(0.25 MG) BY MOUTH DAILY AS NEEDED FOR ANXIETY   escitalopram 10 MG tablet Commonly known as: LEXAPRO Take 1 tablet (10 mg total) by mouth daily.   esomeprazole 40 MG capsule Commonly known as: NEXIUM TAKE ONE CAPSULE BY MOUTH DAILY AT NOON   hydrochlorothiazide 12.5 MG capsule Commonly known as: MICROZIDE Take 1 capsule (12.5 mg total) by mouth daily.   ZyrTEC Allergy 10 MG tablet Generic drug: cetirizine Take 10 mg by mouth daily.  History (reviewed): Past Medical History:  Diagnosis Date  . Anxiety   . Cystocele   . Degenerative disc disease   . Hypertension   . IBS (irritable bowel syndrome)   . Rectocele    Past Surgical History:  Procedure Laterality Date  . ABDOMINAL HYSTERECTOMY     partial  . CHOLECYSTECTOMY    . LAPAROSCOPIC APPENDECTOMY N/A 02/16/2018   Procedure: APPENDECTOMY LAPAROSCOPIC;  Surgeon: Romie Leveehomas, Alicia, MD;   Location: [facility];  Service: General;  Laterality: N/A;   Family History  Problem Relation Age of Onset  . Depression Other   . GER disease Son   . Depression Son    Social History   Socioeconomic History  . Marital status: Widowed    Spouse name: Not on file  . Number of children: 3  . Years of education: 8913  . Highest education level: Some college, no degree  Occupational History  . Not on file  Tobacco Use  . Smoking status: Never Smoker  . Smokeless tobacco: Never Used  Substance and Sexual Activity  . Alcohol use: No  . Drug use: No  . Sexual activity: Not on file  Other Topics Concern  . 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   Social Determinants of Health   Financial Resource Strain: Low Risk   . Difficulty of Paying Living Expenses: Not hard at all  Food Insecurity: No Food Insecurity  . Worried About Programme researcher, broadcasting/film/videounning Out of Food in the Last Year: Never true  . Ran Out of Food in the Last Year: Never true  Transportation Needs: No Transportation Needs  . Lack of Transportation (Medical): No  . Lack of Transportation (Non-Medical): No  Physical Activity: Inactive  . Days of Exercise per Week: 0 days  . Minutes of Exercise per Session: 0 min  Stress: No Stress Concern Present  . Feeling of Stress : Not at all  Social Connections: Socially Isolated  . Frequency of Communication with Friends and Family: More than three times a week  . Frequency of Social Gatherings with Friends and Family: More than three times a week  . Attends Religious Services: Never  . Active Member of Clubs or Organizations: No  . Attends BankerClub or Organization Meetings: Never  . Marital Status: Widowed    Activities of Daily Living In your present state of health, do you have any difficulty performing the following activities: 09/23/2020  Hearing? N  Vision? N  Difficulty concentrating or  making decisions? N  Walking or climbing stairs? Y  Comment uses a walker.  Dressing or bathing? Y  Comment her daughter helps with that.  Doing errands, shopping? Y  Comment doesn't drive; her daughter takes her to the appointments.  Preparing Food and eating ? N  Using the Toilet? N  In the past six months, have you accidently leaked urine? N  Do you have problems with loss of bowel control? N  Managing your Medications? N  Managing your Finances? N  Housekeeping or managing your Housekeeping? Y  Comment her son and daughter help with that  Some recent data might be hidden    Patient Education/ Literacy How often do you need to have someone help you when you read instructions, pamphlets, or other written materials from your doctor or pharmacy?: 1 - Never What is the last grade level you completed in school?: 12th Grade  Exercise Current Exercise  Habits: The patient does not participate in regular exercise at present, Exercise limited by: orthopedic condition(s)  Diet Patient reports consuming 3 meals a day and 2-3 snack(s) a day Patient reports that her primary diet is: Regular Patient reports that she does have regular access to food.   Depression Screen PHQ 2/9 Scores 09/23/2020 12/03/2019 04/27/2019 09/25/2018 01/28/2018 08/20/2017 04/09/2017  PHQ - 2 Score 0 3 0 0 0 0 0  PHQ- 9 Score - 4 - 1 - 1 -     Fall Risk Fall Risk  09/23/2020 04/27/2019 04/09/2017 02/06/2016 02/23/2014  Falls in the past year? 0 0 No No Yes  Number falls in past yr: 0 0 - - 1  Injury with Fall? 0 0 - - -  Risk for fall due to : No Fall Risks - - - -  Follow up Falls evaluation completed - - - -     Objective:  Tonya [name] seemed alert and oriented and she participated appropriately during our telephone visit.  Blood Pressure Weight BMI  BP Readings from Last 3 Encounters:  03/25/20 126/71  12/03/19 122/80  04/27/19 139/71   Wt Readings from Last 3 Encounters:  09/23/20 156 lb (70.8 kg)   09/25/18 166 lb (75.3 kg)  04/28/18 166 lb (75.3 kg)   BMI Readings from Last 1 Encounters:  09/23/20 23.72 kg/m    *Unable to obtain current vital signs, weight, and BMI due to telephone visit type  Hearing/Vision  . Tonya [name] did not seem to have difficulty with hearing/understanding during the telephone conversation . Reports that she has not had a formal eye exam by an eye care professional within the past year . Reports that she has not had a formal hearing evaluation within the past year *Unable to fully assess hearing and vision during telephone visit type  Cognitive Function: 6CIT Screen 09/23/2020  What Year? 0 points  What month? 0 points  What time? 0 points  Count back from 20 0 points  Months in reverse 0 points  Repeat phrase 0 points  Total Score 0   (Normal:0-7, Significant for Dysfunction: >8)  Normal Cognitive Function Screening: Yes   Immunization & Health Maintenance Record Immunization History  Administered Date(s) Administered  . Pneumococcal Conjugate-13 02/23/2014  . Pneumococcal Polysaccharide-23 08/20/2017  . Tdap 06/21/2011    Health Maintenance  Topic Date Due  . COVID-19 Vaccine (1) 12/02/2020 (Originally 12/31/1931)  . INFLUENZA VACCINE  01/16/2021  . TETANUS/TDAP  06/20/2021  . DEXA SCAN  Completed  . PNA vac Low Risk Adult  Completed  . HPV VACCINES  Aged Out       Assessment  This is a routine wellness examination for Tonya [name].  Health Maintenance: Due or Overdue There are no preventive care reminders to display for this patient.  Tonya [name] A Fragoso does not need a referral for [employer] Assistance: Care Management:   no Social Work:    no Prescription Assistance:  no Nutrition/Diabetes Education:  no   Plan:  Personalized Goals Goals Addressed            This Visit's Progress   . Patient Stated       09/23/2020 AWV Goal: Exercise for General Health   Patient will verbalize understanding of the benefits of  increased physical activity:  Exercising regularly is important. It will improve your overall fitness, flexibility, and endurance.  Regular exercise also will improve your overall health. It can help you control your weight, reduce stress,  and improve your bone density.  Over the next year, patient will increase physical activity as tolerated with a goal of at least 150 minutes of moderate physical activity per week.   You can tell that you are exercising at a moderate intensity if your heart starts beating faster and you start breathing faster but can still hold a conversation.  Moderate-intensity exercise ideas include:  Walking 1 mile (1.6 km) in about 15 minutes  Biking  Hiking  Golfing  Dancing  Water aerobics  Patient will verbalize understanding of everyday activities that increase physical activity by providing examples like the following: ? Yard work, such as: ? Pushing a Surveyor, mining ? Raking and bagging leaves ? Washing your car ? Pushing a stroller ? Shoveling snow ? Gardening ? Washing windows or floors  Patient will be able to explain general safety guidelines for exercising:   Before you start a new exercise program, talk with your health care provider.  Do not exercise so much that you hurt yourself, feel dizzy, or get very short of breath.  Wear comfortable clothes and wear shoes with good support.  Drink plenty of water while you exercise to prevent dehydration or heat stroke.  Work out until your breathing and your heartbeat get faster.       Personalized Health Maintenance & Screening Recommendations  Influenza vaccine Bone densitometry screening  Covid vaccine Shingles vaccine  Patient declined influenza vaccine, bond density screening, covid vaccines and shingles vaccine at this time.  Lung Cancer Screening Recommended: no (Low Dose CT Chest recommended if Age 11-80 years, 30 pack-year currently smoking OR have quit w/in past 15  years) Hepatitis C Screening recommended: no HIV Screening recommended: no  Advanced Directives: Written information was not prepared per patient's request.  Referrals & Orders No orders of the defined types were placed in this encounter.   Follow-up Plan . Follow-up with Agapito Games, MD as planned . Medicare wellness in one year.    I have personally reviewed and noted the following in the patient's chart:   . Medical and social history . Use of alcohol, tobacco or illicit drugs  . Current medications and supplements . Functional ability and status . Nutritional status . Physical activity . Advanced directives . List of other physicians . Hospitalizations, surgeries, and ER visits in previous 12 months . Vitals . Screenings to include cognitive, depression, and falls . Referrals and appointments  In addition, I have reviewed and discussed with Tonya [name] certain preventive protocols, quality metrics, and best practice recommendations. A written personalized care plan for preventive services as well as general preventive health recommendations is available and can be mailed to the patient at her request.      Modesto Charon, RN  09/23/2020

## 2020-09-23 NOTE — Patient Instructions (Addendum)
MEDICARE ANNUAL WELLNESS VISIT Health Maintenance Summary and Written Plan of Care  Ms. Langston ,  Thank you for allowing me to perJeanella Antonform your Medicare Annual Wellness Visit and for your ongoing commitment to your health.   Health Maintenance & Immunization History Health Maintenance  Topic Date Due  . COVID-19 Vaccine (1) 12/02/2020 (Originally 12/31/1931)  . INFLUENZA VACCINE  01/16/2021  . TETANUS/TDAP  06/20/2021  . DEXA SCAN  Completed  . PNA vac Low Risk Adult  Completed  . HPV VACCINES  Aged Out   Immunization History  Administered Date(s) Administered  . Pneumococcal Conjugate-13 02/23/2014  . Pneumococcal Polysaccharide-23 08/20/2017  . Tdap 06/21/2011    These are the patient goals that we discussed: Goals Addressed            This Visit's Progress   . Patient Stated       09/23/2020 AWV Goal: Exercise for General Health   Patient will verbalize understanding of the benefits of increased physical activity:  Exercising regularly is important. It will improve your overall fitness, flexibility, and endurance.  Regular exercise also will improve your overall health. It can help you control your weight, reduce stress, and improve your bone density.  Over the next year, patient will increase physical activity as tolerated with a goal of at least 150 minutes of moderate physical activity per week.   You can tell that you are exercising at a moderate intensity if your heart starts beating faster and you start breathing faster but can still hold a conversation.  Moderate-intensity exercise ideas include:  Walking 1 mile (1.6 km) in about 15 minutes  Biking  Hiking  Golfing  Dancing  Water aerobics  Patient will verbalize understanding of everyday activities that increase physical activity by providing examples like the following: ? Yard work, such as: ? Pushing a Surveyor, mininglawn mower ? Raking and bagging leaves ? Washing your car ? Pushing a stroller ? Shoveling  snow ? Gardening ? Washing windows or floors  Patient will be able to explain general safety guidelines for exercising:   Before you start a new exercise program, talk with your health care provider.  Do not exercise so much that you hurt yourself, feel dizzy, or get very short of breath.  Wear comfortable clothes and wear shoes with good support.  Drink plenty of water while you exercise to prevent dehydration or heat stroke.  Work out until your breathing and your heartbeat get faster.         This is a list of Health Maintenance Items that are overdue or due now: Influenza vaccine Bone densitometry screening  Covid vaccine Shingles vaccine  Orders/Referrals Placed Today: No orders of the defined types were placed in this encounter.  (Contact our referral department at [phone] if you have not spoken with someone about your referral appointment within the next 5 days)    Follow-up Plan . Follow-up with Agapito GamesMetheney, Catherine D, MD as planned . Medicare wellness in one year.       Bone Density Test A bone density test uses a type of X-ray to measure the amount of calcium and other minerals in a person's bones. It can measure bone density in the hip and the spine. The test is similar to having a regular X-ray. This test may also be called:  Bone densitometry.  Bone mineral density test.  Dual-energy X-ray absorptiometry (DEXA). You may have this test to:  Diagnose a condition that causes weak or thin bones (osteoporosis).  Screen you for osteoporosis.  Predict your risk for a broken bone (fracture).  Determine how well your osteoporosis treatment is working. Tell a health care provider about:  Any allergies you have.  All medicines you are taking, including vitamins, herbs, eye drops, creams, and over-the-counter medicines.  Any problems you or family members have had with anesthetic medicines.  Any blood disorders you have.  Any surgeries you have  had.  Any medical conditions you have.  Whether you are pregnant or may be pregnant.  Any medical tests you have had within the past 14 days that used contrast material. What are the risks? Generally, this is a safe test. However, it does expose you to a small amount of radiation, which can slightly increase your cancer risk. What happens before the test?  Do not take any calcium supplements within the 24 hours before your test.  You will need to remove all metal jewelry, eyeglasses, removable dental appliances, and any other metal objects on your body. What happens during the test?  You will lie down on an exam table. There will be an X-ray generator below you and an imaging device above you.  Other devices, such as boxes or braces, may be used to position your body properly for the scan.  The machine will slowly scan your body. You will need to keep very still while the machine does the scan.  The images will show up on a screen in the room. Images will be examined by a specialist after your test is finished. The procedure may vary among health care providers and hospitals.   What can I expect after the test? It is up to you to get the results of your test. Ask your health care provider, or the department that is doing the test, when your results will be ready. Summary  A bone density test is an imaging test that uses a type of X-ray to measure the amount of calcium and other minerals in your bones.  The test may be used to diagnose or screen you for a condition that causes weak or thin bones (osteoporosis), predict your risk for a broken bone (fracture), or determine how well your osteoporosis treatment is working.  Do not take any calcium supplements within 24 hours before your test.  Ask your health care provider, or the department that is doing the test, when your results will be ready. This information is not intended to replace advice given to you by your health care  provider. Make sure you discuss any questions you have with your health care provider. Document Revised: 11/19/2019 Document Reviewed: 11/19/2019 Elsevier Patient Education  2021 Else[employer] Inc.   Health Maintenance, Female Adopting a healthy lifestyle and getting preventive care are important in promoting health and wellness. Ask your health care provider about:  The right schedule for you to have regular tests and exams.  Things you can do on your own to prevent diseases and keep yourself healthy. What should I know about diet, weight, and exercise? Eat a healthy diet  Eat a diet that includes plenty of vegetables, fruits, low-fat dairy products, and lean protein.  Do not eat a lot of foods that are high in solid fats, added sugars, or sodium.   Maintain a healthy weight Body mass index (BMI) is used to identify weight problems. It estimates body fat based on height and weight. Your health care provider can help determine your BMI and help you achieve or maintain a healthy weight. Get  regular exercise Get regular exercise. This is one of the most important things you can do for your health. Most adults should:  Exercise for at least 150 minutes each week. The exercise should increase your heart rate and make you sweat (moderate-intensity exercise).  Do strengthening exercises at least twice a week. This is in addition to the moderate-intensity exercise.  Spend less time sitting. Even light physical activity can be beneficial. Watch cholesterol and blood lipids Have your blood tested for lipids and cholesterol at 85 years of age, then have this test every 5 years. Have your cholesterol levels checked more often if:  Your lipid or cholesterol levels are high.  You are older than 85 years of age.  You are at high risk for heart disease. What should I know about cancer screening? Depending on your health history and family history, you may need to have cancer screening at various  ages. This may include screening for:  Breast cancer.  Cervical cancer.  Colorectal cancer.  Skin cancer.  Lung cancer. What should I know about heart disease, diabetes, and high blood pressure? Blood pressure and heart disease  High blood pressure causes heart disease and increases the risk of stroke. This is more likely to develop in people who have high blood pressure readings, are of African descent, or are overweight.  Have your blood pressure checked: ? Every 3-5 years if you are 95-11 years of age. ? Every year if you are 71 years old or older. Diabetes Have regular diabetes screenings. This checks your fasting blood sugar level. Have the screening done:  Once every three years after age 4 if you are at a normal weight and have a low risk for diabetes.  More often and at a younger age if you are overweight or have a high risk for diabetes. What should I know about preventing infection? Hepatitis B If you have a higher risk for hepatitis B, you should be screened for this virus. Talk with your health care provider to find out if you are at risk for hepatitis B infection. Hepatitis C Testing is recommended for:  Everyone born from [birth year] through 1963.  Anyone with known risk factors for hepatitis C. Sexually transmitted infections (STIs)  Get screened for STIs, including gonorrhea and chlamydia, if: ? You are sexually active and are younger than 85 years of age. ? You are older than 85 years of age and your health care provider tells you that you are at risk for this type of infection. ? Your sexual activity has changed since you were last screened, and you are at increased risk for chlamydia or gonorrhea. Ask your health care provider if you are at risk.  Ask your health care provider about whether you are at high risk for HIV. Your health care provider may recommend a prescription medicine to help prevent HIV infection. If you choose to take medicine to prevent HIV, you  should first get tested for HIV. You should then be tested every 3 months for as long as you are taking the medicine. Pregnancy  If you are about to stop having your period (premenopausal) and you may become pregnant, seek counseling before you get pregnant.  Take 400 to 800 micrograms (mcg) of folic acid every day if you become pregnant.  Ask for birth control (contraception) if you want to prevent pregnancy. Osteoporosis and menopause Osteoporosis is a disease in which the bones lose minerals and strength with aging. This can result in bone fractures.  If you are 69 years old or older, or if you are at risk for osteoporosis and fractures, ask your health care provider if you should:  Be screened for bone loss.  Take a calcium or vitamin D supplement to lower your risk of fractures.  Be given hormone replacement therapy (HRT) to treat symptoms of menopause. Follow these instructions at home: Lifestyle  Do not use any products that contain nicotine or tobacco, such as cigarettes, e-cigarettes, and chewing tobacco. If you need help quitting, ask your health care provider.  Do not use street drugs.  Do not share needles.  Ask your health care provider for help if you need support or information about quitting drugs. Alcohol use  Do not drink alcohol if: ? Your health care provider tells you not to drink. ? You are pregnant, may be pregnant, or are planning to become pregnant.  If you drink alcohol: ? Limit how much you use to 0-1 drink a day. ? Limit intake if you are breastfeeding.  Be aware of how much alcohol is in your drink. In the [place] one drink equals one 12 oz bottle of beer (355 mL), one 5 oz glass of wine (148 mL), or one 1 oz glass of hard liquor (44 mL). General instructions  Schedule regular health, dental, and eye exams.  Stay current with your vaccines.  Tell your health care provider if: ? You often feel depressed. ? You have ever been abused or do not feel  safe at home. Summary  Adopting a healthy lifestyle and getting preventive care are important in promoting health and wellness.  Follow your health care provider's instructions about healthy diet, exercising, and getting tested or screened for diseases.  Follow your health care provider's instructions on monitoring your cholesterol and blood pressure. This information is not intended to replace advice given to you by your health care provider. Make sure you discuss any questions you have with your health care provider. Document Revised: 05/28/2018 Document Reviewed: 05/28/2018 Elsevier Patient Education  2021 [employer].

## 2020-09-29 DIAGNOSIS — M170 Bilateral primary osteoarthritis of knee: Secondary | ICD-10-CM | POA: Diagnosis not present

## 2020-10-13 ENCOUNTER — Other Ambulatory Visit: Payer: Self-pay | Admitting: *Deleted

## 2020-10-13 DIAGNOSIS — F411 Generalized anxiety disorder: Secondary | ICD-10-CM

## 2020-10-13 MED ORDER — ALPRAZOLAM 0.25 MG PO TABS: ORAL_TABLET | ORAL | 2 refills | Status: DC

## 2020-10-19 ENCOUNTER — Telehealth: Payer: Self-pay | Admitting: Family Medicine

## 2020-10-19 NOTE — Telephone Encounter (Signed)
Patient called stating she has a possible sinus infection and wanted to be seen tomorrow. Acute slots only, patient is a 40 minute patient. Would only ike to see PCP. Please advise.

## 2020-10-20 ENCOUNTER — Encounter: Payer: Self-pay | Admitting: Family Medicine

## 2020-10-20 ENCOUNTER — Telehealth (INDEPENDENT_AMBULATORY_CARE_PROVIDER_SITE_OTHER): Payer: Medicare HMO | Admitting: Family Medicine

## 2020-10-20 ENCOUNTER — Telehealth: Payer: Medicare HMO | Admitting: Family Medicine

## 2020-10-20 VITALS — BP 120/75 | HR 69 | Temp 97.9°F

## 2020-10-20 DIAGNOSIS — J019 Acute sinusitis, unspecified: Secondary | ICD-10-CM | POA: Diagnosis not present

## 2020-10-20 MED ORDER — PREDNISONE 20 MG PO TABS: 40.0000 mg | ORAL_TABLET | Freq: Every day | ORAL | 0 refills | Status: DC

## 2020-10-20 NOTE — Progress Notes (Signed)
Virtual Visit via Telephone Note  I connected with [place] on 10/20/20 at 10:10 AM EDT by telephone and verified that I am speaking with the correct person using two identifiers.   I discussed the limitations, risks, security and privacy concerns of performing an evaluation and management service by telephone and the availability of in person appointments. I also discussed with the patient that there may be a patient responsible charge related to this service. The patient expressed understanding and agreed to proceed.  Patient location: at home  Provider loccation: In office   Subjective:    CC: sinus sxs.    HPI: She reports sinus congestion and drainage and pressure for the last couple of weeks.  She says she really feels like it is just allergies.  No fevers chills or sweats.  No significant shortness of breath.  She has had a cough but feels like it some of the drainage.  No wheezing.  He is already been using Zyrtec.  Recently had a family member passed away and is planning on going to the funeral this weekend.  Also been under a lot of stress recently her son is can have to have a revision of his recent shoulder surgery after work injury and her other son is having knee replacement.   Past medical history, Surgical history, Family history not pertinant except as noted below, Social history, Allergies, and medications have been entered into the medical record, reviewed, and corrections made.   Review of Systems: No fevers, chills, night sweats, weight loss, chest pain, or shortness of breath.   Objective:    General: Speaking clearly in complete sentences without any shortness of breath.  Alert and oriented x3.  Normal judgment. No apparent acute distress.    Impression and Recommendations:    Allergic rhinitis-discussed treatment with Flonase or Nasonex.  We did discuss though that it oftentimes takes 4 to 5 days for it to really kick ends.  I also sent over  prescription for prednisone for her to fill over the weekend if she is not feeling some relief or if she feels like she is getting worse.  She is actually planning on being outside this weekend to attend a family member's funeral so we did discuss that if she feels like she is worse after the weekend please give [place] call back.    I discussed the assessment and treatment plan with the patient. The patient was provided an opportunity to ask questions and all were answered. The patient agreed with the plan and demonstrated an understanding of the instructions.   The patient was advised to call back or seek an in-person evaluation if the symptoms worsen or if the condition fails to improve as anticipated.  I provided 20 minutes of non-face-to-face time during this encounter.   [name], MD

## 2020-10-20 NOTE — Telephone Encounter (Signed)
Appointment has been made

## 2020-10-20 NOTE — Progress Notes (Signed)
Pt stated that she believes that it is her allergies and not sinuses.  She has a horrible cough with mucous some color. She feels that the color is associated with the pollen after being outside on the porch for sometime.   She stated that it feels that it is in her throat and she has been taking zyrtec. She wanted to speak to Dr. Linford Arnold to see if she should take anything else.   Denies any f/s/c/n/v/headache/body aches/facial pain

## 2020-10-20 NOTE — Telephone Encounter (Signed)
Okay to use to 40 acute slot.

## 2020-12-03 ENCOUNTER — Other Ambulatory Visit: Payer: Self-pay | Admitting: Family Medicine

## 2020-12-03 DIAGNOSIS — I10 Essential (primary) hypertension: Secondary | ICD-10-CM

## 2020-12-12 ENCOUNTER — Other Ambulatory Visit: Payer: Self-pay | Admitting: Family Medicine

## 2020-12-12 DIAGNOSIS — I10 Essential (primary) hypertension: Secondary | ICD-10-CM

## 2020-12-12 DIAGNOSIS — F411 Generalized anxiety disorder: Secondary | ICD-10-CM

## 2020-12-12 MED ORDER — HYDROCHLOROTHIAZIDE 12.5 MG PO CAPS: 12.5000 mg | ORAL_CAPSULE | Freq: Every day | ORAL | 1 refills | Status: DC

## 2020-12-12 MED ORDER — ESCITALOPRAM OXALATE 10 MG PO TABS: 10.0000 mg | ORAL_TABLET | Freq: Every day | ORAL | 1 refills | Status: DC

## 2020-12-12 MED ORDER — ALPRAZOLAM 0.25 MG PO TABS: ORAL_TABLET | ORAL | 3 refills | Status: DC

## 2020-12-12 NOTE — Progress Notes (Signed)
Meds refilled. Please schedule a f/u in Oct for a 6 mo f/u

## 2020-12-20 ENCOUNTER — Telehealth: Payer: Self-pay | Admitting: Family Medicine

## 2020-12-20 NOTE — Chronic Care Management (AMB) (Signed)
  Chronic Care Management   Note  12/20/2020 Name: Tonya [name] MRN: [id number] DOB: 08/11/1926  Tonya [name] is a 87 y.o. year old female who is a primary care patient of Metheney, Barbarann Ehlers, MD. I reached out to [place] by phone today in response to a referral sent by Tonya [name]'s PCP, Agapito Games, MD.   Ms. Dornbush was given information about Chronic Care Management services today including:  CCM service includes personalized support from designated clinical staff supervised by her physician, including individualized plan of care and coordination with other care providers 24/7 contact phone numbers for assistance for urgent and routine care needs. Service will only be billed when office clinical staff spend 20 minutes or more in a month to coordinate care. Only one practitioner may furnish and bill the service in a calendar month. The patient may stop CCM services at any time (effective at the end of the month) by phone call to the office staff.   Patient did not agree to enrollment in care management services and does not wish to consider at this time.  Follow up plan:   Carmell [place] [employer] Scheduler

## 2020-12-20 NOTE — Telephone Encounter (Signed)
Tonya [name] called today stating she received a call about scheduling an appt with CCM. She is not interested in speaking with pharmacist. She say she relies on her son when having a question about meds. He has been a Teacher, early years/pre for over 30 years.

## 2021-01-03 ENCOUNTER — Other Ambulatory Visit: Payer: Self-pay | Admitting: Family Medicine

## 2021-01-03 DIAGNOSIS — I10 Essential (primary) hypertension: Secondary | ICD-10-CM

## 2021-01-03 DIAGNOSIS — M810 Age-related osteoporosis without current pathological fracture: Secondary | ICD-10-CM

## 2021-01-03 DIAGNOSIS — Z13220 Encounter for screening for lipoid disorders: Secondary | ICD-10-CM

## 2021-01-05 NOTE — Telephone Encounter (Signed)
Please call pt and let her know that she MUST GO HAVE HER LABS DONE FOR REFILLS ON HER BP MEDICATION.   It hasn't been checked in over a year. These labs are fasting. Thanks!  She can go anytime.

## 2021-01-06 ENCOUNTER — Other Ambulatory Visit: Payer: Self-pay | Admitting: Family Medicine

## 2021-01-06 NOTE — Telephone Encounter (Signed)
Spoke with patient and let her know that she needs to have labs done prior to getting a refill on BP meds. Patient stated she doesn't drive and she will have to check with her driver, I let patient know we are doing labs up here now so she can come up here or still go downstairs, whichever she prefers and that as long as she is fasting. AM

## 2021-01-09 ENCOUNTER — Other Ambulatory Visit: Payer: Self-pay | Admitting: *Deleted

## 2021-01-09 MED ORDER — ESOMEPRAZOLE MAGNESIUM 40 MG PO CPDR: 40.0000 mg | DELAYED_RELEASE_CAPSULE | Freq: Every day | ORAL | 3 refills | Status: DC

## 2021-01-13 ENCOUNTER — Other Ambulatory Visit: Payer: Self-pay | Admitting: Family Medicine

## 2021-01-13 DIAGNOSIS — F411 Generalized anxiety disorder: Secondary | ICD-10-CM

## 2021-01-19 ENCOUNTER — Ambulatory Visit: Payer: Medicare HMO | Admitting: Osteopathic Medicine

## 2021-02-02 DIAGNOSIS — M48061 Spinal stenosis, lumbar region without neurogenic claudication: Secondary | ICD-10-CM | POA: Diagnosis not present

## 2021-02-02 DIAGNOSIS — M170 Bilateral primary osteoarthritis of knee: Secondary | ICD-10-CM | POA: Diagnosis not present

## 2021-02-21 DIAGNOSIS — M205X1 Other deformities of toe(s) (acquired), right foot: Secondary | ICD-10-CM | POA: Diagnosis not present

## 2021-02-21 DIAGNOSIS — R52 Pain, unspecified: Secondary | ICD-10-CM | POA: Diagnosis not present

## 2021-02-21 DIAGNOSIS — M722 Plantar fascial fibromatosis: Secondary | ICD-10-CM | POA: Diagnosis not present

## 2021-04-05 ENCOUNTER — Encounter: Payer: Self-pay | Admitting: Family Medicine

## 2021-04-05 ENCOUNTER — Telehealth (INDEPENDENT_AMBULATORY_CARE_PROVIDER_SITE_OTHER): Payer: Medicare HMO | Admitting: Family Medicine

## 2021-04-05 VITALS — BP 136/73 | HR 68

## 2021-04-05 DIAGNOSIS — I10 Essential (primary) hypertension: Secondary | ICD-10-CM

## 2021-04-05 DIAGNOSIS — H9201 Otalgia, right ear: Secondary | ICD-10-CM | POA: Diagnosis not present

## 2021-04-05 DIAGNOSIS — F411 Generalized anxiety disorder: Secondary | ICD-10-CM | POA: Diagnosis not present

## 2021-04-05 DIAGNOSIS — R053 Chronic cough: Secondary | ICD-10-CM | POA: Diagnosis not present

## 2021-04-05 DIAGNOSIS — K2100 Gastro-esophageal reflux disease with esophagitis, without bleeding: Secondary | ICD-10-CM

## 2021-04-05 DIAGNOSIS — R29898 Other symptoms and signs involving the musculoskeletal system: Secondary | ICD-10-CM | POA: Diagnosis not present

## 2021-04-05 MED ORDER — AMOXICILLIN 875 MG PO TABS: 875.0000 mg | ORAL_TABLET | Freq: Two times a day (BID) | ORAL | 0 refills | Status: AC

## 2021-04-05 NOTE — Progress Notes (Signed)
Virtual Visit via Telephone Note  I connected with [place] on 04/05/21 at 10:50 AM EDT by telephone and verified that I am speaking with the correct person using two identifiers.   I discussed the limitations, risks, security and privacy concerns of performing an evaluation and management service by telephone and the availability of in person appointments. I also discussed with the patient that there may be a patient responsible charge related to this service. The patient expressed understanding and agreed to proceed.  Patient location: at home  Provider loccation: In office   Subjective:    CC:   HPI:  Still having a coughing spell.  Worse last week and has had right ear pain. No swelling or ear draining. No sore it is hard to chew.  Some better.  Mild ST on the right. More mucous than usual in her throat.   No choking when eating.   F/U GERD  - questions about her Nexium. Feels like it makes her super gassy. She would like to use TUMs instead.   Hypertension- Pt denies chest pain, SOB, dizziness, or heart palpitations.  Taking meds as directed w/o problems.  Denies medication side effects.    Has been having more problems gong up the stairs. She can go down OK but can't go up.  Says has a hard time getting into her son's truck to go places. She no longer drives and is home bound.    Past medical history, Surgical history, Family history not pertinant except as noted below, Social history, Allergies, and medications have been entered into the medical record, reviewed, and corrections made.   Review of Systems: No fevers, chills, night sweats, weight loss, chest pain, or shortness of breath.   Objective:    General: Speaking clearly in complete sentences without any shortness of breath.  Alert and oriented x3.  Normal judgment. No apparent acute distress.    Impression and Recommendations:    HYPERTENSION, BENIGN Well controlled. Continue current regimen. Follow up in   6 mo. Monitor periodically at home.   Weakness of both lower extremities Discussed possible consideration of home PT via Home Health for strength training.   ANXIETY DISORDER, GENERALIZED Did refill her xnanx. Encourage her to continue to work on weaning the frequency of the medication. Continue lexapro.   GERD Continue to taper PPI. Discussed long term effects of PPIs. Can restart or increase if sxs or cough worsens.   Chronic cough Consider CXR and spirometry. I would recommend further workup but for now having difficulty with transportation.     Right ear pain with ST and drainage - will tx for OM. If not improving then will need to be in person. Consider eustachian tube dysfunction vs TMJ.    Reminded her to get her labs done as soon as she can.    I discussed the assessment and treatment plan with the patient. The patient was provided an opportunity to ask questions and all were answered. The patient agreed with the plan and demonstrated an understanding of the instructions.   The patient was advised to call back or seek an in-person evaluation if the symptoms worsen or if the condition fails to improve as anticipated.  I provided 25 minutes of non-face-to-face time during this encounter.   [name], MD

## 2021-04-05 NOTE — Progress Notes (Signed)
Called and went to busy signal.

## 2021-04-05 NOTE — Assessment & Plan Note (Signed)
Did refill her xnanx. Encourage her to continue to work on weaning the frequency of the medication. Continue lexapro.

## 2021-04-05 NOTE — Assessment & Plan Note (Signed)
Discussed possible consideration of home PT via Home Health for strength training.

## 2021-04-05 NOTE — Assessment & Plan Note (Signed)
Continue to taper PPI. Discussed long term effects of PPIs. Can restart or increase if sxs or cough worsens.

## 2021-04-05 NOTE — Assessment & Plan Note (Addendum)
Well controlled. Continue current regimen. Follow up in  6 mo. Monitor periodically at home.

## 2021-04-05 NOTE — Progress Notes (Signed)
Called twice and both times the phone would ring several times no answer but got a busy tone.

## 2021-04-05 NOTE — Assessment & Plan Note (Signed)
Consider CXR and spirometry. I would recommend further workup but for now having difficulty with transportation.

## 2021-05-03 DIAGNOSIS — H9201 Otalgia, right ear: Secondary | ICD-10-CM | POA: Diagnosis not present

## 2021-05-04 ENCOUNTER — Telehealth: Payer: Self-pay | Admitting: Family Medicine

## 2021-05-04 NOTE — Telephone Encounter (Signed)
Tonya [name]  called to let you know that she did go see Dr.Moore (ENT) for her ear. She had pulled muscles around the ear and he cleaned them out. She also wanted to let you know that her daughter [name] has been sick with flu like symptoms. She wanted to make you aware in case you wanted to call something in for her.

## 2021-05-10 ENCOUNTER — Other Ambulatory Visit: Payer: Self-pay

## 2021-05-10 DIAGNOSIS — F411 Generalized anxiety disorder: Secondary | ICD-10-CM

## 2021-05-10 MED ORDER — ALPRAZOLAM 0.25 MG PO TABS: ORAL_TABLET | ORAL | 3 refills | Status: DC

## 2021-05-10 NOTE — Telephone Encounter (Signed)
Pt called requesting refill of alprazolam.  She is going out of town today.  [name], CMA

## 2021-05-19 ENCOUNTER — Other Ambulatory Visit: Payer: Self-pay | Admitting: Family Medicine

## 2021-05-22 DIAGNOSIS — M170 Bilateral primary osteoarthritis of knee: Secondary | ICD-10-CM | POA: Diagnosis not present

## 2021-06-08 ENCOUNTER — Other Ambulatory Visit: Payer: Self-pay | Admitting: Family Medicine

## 2021-06-26 ENCOUNTER — Ambulatory Visit (INDEPENDENT_AMBULATORY_CARE_PROVIDER_SITE_OTHER): Payer: Medicare HMO | Admitting: Family Medicine

## 2021-06-26 ENCOUNTER — Other Ambulatory Visit: Payer: Self-pay

## 2021-06-26 ENCOUNTER — Encounter: Payer: Self-pay | Admitting: Family Medicine

## 2021-06-26 DIAGNOSIS — I10 Essential (primary) hypertension: Secondary | ICD-10-CM

## 2021-06-26 MED ORDER — HYDROCHLOROTHIAZIDE 12.5 MG PO CAPS: ORAL_CAPSULE | ORAL | 1 refills | Status: DC

## 2021-06-26 NOTE — Assessment & Plan Note (Signed)
Pressure is a little borderline elevated today would like to keep the systolic less than 140.  I did go ahead and refill her medication today which based on her refill history she should have been out of for almost 4 months.

## 2021-06-26 NOTE — Progress Notes (Signed)
Acute Office Visit  Subjective:    Patient ID: Tonya [name], female    DOB: Jul 09, 1926, 88 y.o.   MRN: [id number]  Chief Complaint  Patient presents with   spots in mouth    HPI Patient is in today for spots in her mouth that she noticed this AM.  Says she did not feel a bump and she did not notice anything painful.  Her son reports that he has been asking him to check inside of her mouth almost daily for weeks.  Her daughter is here with her today.  Saw ENT before Xmas for right ear pain.  He did remove some cerumen.  But felt like her pain was probably more referred pain and likely to be more of a musculoskeletal issue and so recommended some stretching and NSAIDs. IBU helps.  Warm compresses help some.  She denies any recent dental pain.  Past Medical History:  Diagnosis Date   Anxiety    Cystocele    Degenerative disc disease    Hypertension    IBS (irritable bowel syndrome)    Rectocele     Past Surgical History:  Procedure Laterality Date   ABDOMINAL HYSTERECTOMY     partial   CHOLECYSTECTOMY     LAPAROSCOPIC APPENDECTOMY N/A 02/16/2018   Procedure: APPENDECTOMY LAPAROSCOPIC;  Surgeon: Leighton Ruff, MD;  Location: [facility];  Service: General;  Laterality: N/A;    Family History  Problem Relation Age of Onset   Depression Other    GER disease Son    Depression Son     Social History   Socioeconomic History   Marital status: Widowed    Spouse name: Not on file   Number of children: 3   Years of education: 36   Highest education level: Some college, no degree  Occupational History   Not on file  Tobacco Use   Smoking status: Never   Smokeless tobacco: Never  Substance and Sexual Activity   Alcohol use: No   Drug use: No   Sexual activity: Not on file  Other Topics Concern   Not on file  Social History Narrative   retired, 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   Social Determinants of  Health   Financial Resource Strain: Low Risk    Difficulty of Paying Living Expenses: Not hard at all  Food Insecurity: No Food Insecurity   Worried About Charity fundraiser in the Last Year: Never true   [place] in the Last Year: Never true  Transportation Needs: No Transportation Needs   Lack of Transportation (Medical): No   Lack of Transportation (Non-Medical): No  Physical Activity: Inactive   Days of Exercise per Week: 0 days   Minutes of Exercise per Session: 0 min  Stress: No Stress Concern Present   Feeling of Stress : Not at all  Social Connections: Socially Isolated   Frequency of Communication with Friends and Family: More than three times a week   Frequency of Social Gatherings with Friends and Family: More than three times a week   Attends Religious Services: Never   Marine scientist or Organizations: No   Attends Archivist Meetings: Never   Marital Status: Widowed  Human resources officer Violence: Not At Risk   Fear of Current or Ex-Partner: No   Emotionally Abused: No   Physically Abused: No   Sexually Abused: No    Outpatient Medications Prior to Visit  Medication Sig Dispense Refill   ALPRAZolam (XANAX) 0.25 MG tablet TAKE 1 TABLET(0.25 MG) BY MOUTH DAILY AS NEEDED FOR ANXIETY 28 tablet 3   cetirizine (ZYRTEC) 10 MG tablet Take 10 mg by mouth daily.     escitalopram (LEXAPRO) 10 MG tablet TAKE 1 TABLET(10 MG) BY MOUTH DAILY 90 tablet 1   esomeprazole (NEXIUM) 40 MG capsule TAKE 1 CAPSULES BY MOUTH DAILY AT 12 NOON 90 capsule 3   hydrochlorothiazide (MICROZIDE) 12.5 MG capsule TAKE 1 CAPSULE(12.5 MG) BY MOUTH DAILY 90 capsule 0   No facility-administered medications prior to visit.    Allergies  Allergen Reactions   Amlodipine Other (See Comments)    Ankle swelling.    Codeine     Other reaction(s): Unknown   Tramadol Nausea Only    Review of Systems     Objective:    Physical Exam Vitals and nursing note reviewed.   Constitutional:      Appearance: She is well-developed.  HENT:     Head: Normocephalic and atraumatic.     Right Ear: Tympanic membrane, ear canal and external ear normal.     Left Ear: External ear normal.  Neck:     Vascular: No carotid bruit.  Cardiovascular:     Rate and Rhythm: Normal rate and regular rhythm.     Heart sounds: Normal heart sounds.  Pulmonary:     Effort: Pulmonary effort is normal.     Breath sounds: Normal breath sounds.  Musculoskeletal:     Cervical back: Neck supple. No tenderness.  Lymphadenopathy:     Cervical: No cervical adenopathy.  Skin:    General: Skin is warm and dry.  Neurological:     Mental Status: She is alert and oriented to person, place, and time.  Psychiatric:        Behavior: Behavior normal.    BP (!) 145/54    Pulse 67    Wt 162 lb (73.5 kg)    SpO2 95%    BMI 24.63 kg/m  Wt Readings from Last 3 Encounters:  06/26/21 162 lb (73.5 kg)  09/23/20 156 lb (70.8 kg)  09/25/18 166 lb (75.3 kg)    There are no preventive care reminders to display for this patient.   There are no preventive care reminders to display for this patient.   Lab Results  Component Value Date   TSH 1.86 09/02/2015   Lab Results  Component Value Date   WBC 10.4 02/16/2018   HGB 13.6 02/16/2018   HCT 40.0 02/16/2018   MCV 89.9 02/16/2018   PLT 198 02/16/2018   Lab Results  Component Value Date   NA 138 12/09/2019   K 4.1 12/09/2019   CO2 30 12/09/2019   GLUCOSE 114 (H) 12/09/2019   BUN 13 12/09/2019   CREATININE 0.94 (H) 12/09/2019   BILITOT 1.1 04/28/2019   ALKPHOS 63 02/16/2018   AST 20 04/28/2019   ALT 14 04/28/2019   PROT 6.6 04/28/2019   ALBUMIN 3.6 02/16/2018   CALCIUM 9.1 12/09/2019   ANIONGAP 12 02/16/2018   Lab Results  Component Value Date   CHOL 192 04/28/2019   Lab Results  Component Value Date   HDL 56 04/28/2019   Lab Results  Component Value Date   LDLCALC 112 (H) 04/28/2019   Lab Results  Component Value  Date   TRIG 126 04/28/2019   Lab Results  Component Value Date   CHOLHDL 3.4 04/28/2019   No results found for: HGBA1C  Assessment & Plan:   Problem List Items Addressed This Visit       Cardiovascular and Mediastinum   HYPERTENSION, BENIGN    Pressure is a little borderline elevated today would like to keep the systolic less than [id number].  I did go ahead and refill her medication today which based on her refill history she should have been out of for almost 4 months.      Relevant Medications   hydrochlorothiazide (MICROZIDE) 12.5 MG capsule   "Spot" in the back of the mouth-did not visualize anything concerning or any lesions on exam.  We discussed that it certainly could have been a little bit of sloughing of skin off the mucosa and it may have moved since then.  But I do not see anything worrisome gave her reassurance.  No sign of swelling.  She is not had any recent dental pain which is also reassuring.  Neck exam and ear exam are reassuring as well.  Meds ordered this encounter  Medications   hydrochlorothiazide (MICROZIDE) 12.5 MG capsule    Sig: TAKE 1 CAPSULE(12.5 MG) BY MOUTH DAILY    Dispense:  90 capsule    Refill:  1     Beatrice Lecher, MD

## 2021-06-30 ENCOUNTER — Telehealth: Payer: Self-pay | Admitting: Family Medicine

## 2021-06-30 NOTE — Telephone Encounter (Signed)
She needs to call her pharmacy . It was sent a month ago. We can verify it was sent to the correct pharmacy

## 2021-06-30 NOTE — Telephone Encounter (Signed)
Prescription was sent on 05/19/21 for 1 year. Patient advised.

## 2021-06-30 NOTE — Telephone Encounter (Signed)
Verified with patient and prescription was sent to correct pharmacy. Patient was advised to contact pharmacy.

## 2021-06-30 NOTE — Telephone Encounter (Signed)
Patient called and said she forgot to ask while she was here last, she going to ask if she could have a refill on this medication listed below, for acid reflux?  Esomeprazole 40mg     [id number] - [place], [place] - [address] AT [facility] [address] Phone:  [phone]  Fax:  [phone]

## 2021-09-04 ENCOUNTER — Other Ambulatory Visit: Payer: Self-pay

## 2021-09-04 DIAGNOSIS — F411 Generalized anxiety disorder: Secondary | ICD-10-CM

## 2021-09-04 MED ORDER — ALPRAZOLAM 0.25 MG PO TABS: ORAL_TABLET | ORAL | 2 refills | Status: DC

## 2021-10-11 DIAGNOSIS — M170 Bilateral primary osteoarthritis of knee: Secondary | ICD-10-CM | POA: Diagnosis not present

## 2021-11-03 ENCOUNTER — Telehealth: Payer: Self-pay | Admitting: *Deleted

## 2021-11-03 NOTE — Telephone Encounter (Signed)
Pt called to let Dr. Linford Arnold know that yesterday she was experiencing a rapid heart rate.she said that her heart rate had gotten up to 136.  She said that when this happened she was preparing a meal. She went in the living room to sit down to rest and check this on her pulse oximetry.   I asked her if she was worried about anything she said that her family including herself were preparing to go to [facility] this weekend and that a lot of things have been occurring in the family recently (birth of a grand child, Greig Castilla graduating from medical school, recent passing of a family member).   I told her that it sounded like she was experiencing a panic attack. She denied any SOB/CP. She said that she really hasn't been out of the house since COVID  and this will actually be her first trip. I told her that she shouldn't continue to isolate herself and that she should take precautions and go enjoy the time with her family however, should she start to feel overwhelmed she should get somewhere and center herself and do some deep breathing. She stated that she has been doing that already and that really helps her a lot.   She was really thankful and stated that she was going to go on the trip with her family and said that Dr. Linford Arnold could call back if she had other suggestions.

## 2021-11-03 NOTE — Telephone Encounter (Signed)
Pt called and LVM asking that Dr. Madilyn Fireman return her phone call.

## 2021-11-06 NOTE — Telephone Encounter (Signed)
If sxs recur, ok to schedule for office appt

## 2021-11-07 NOTE — Telephone Encounter (Signed)
I tried to call patient, the phone rang no answer.

## 2021-11-08 NOTE — Telephone Encounter (Signed)
I tried to call patient, the phone rang no answer. [name], CMA

## 2021-11-08 NOTE — Telephone Encounter (Signed)
Attempted to contact pt.  No answer and no VM.  T. Jamaury Gumz, CMA 

## 2021-11-09 NOTE — Telephone Encounter (Signed)
Attempted to contact pt x 3.  Pt has not returned phone calls.  Encounter closed per protocol.  [name], CMA

## 2021-11-14 ENCOUNTER — Telehealth: Payer: Self-pay | Admitting: Family Medicine

## 2021-11-14 NOTE — Telephone Encounter (Signed)
OK to get tomorrow afternoon after lunch if she can come.

## 2021-11-14 NOTE — Telephone Encounter (Signed)
Pt is scheduled for 11/15/2021 at 11:30AM.  [name], CMA

## 2021-11-14 NOTE — Telephone Encounter (Signed)
Patient scheduled.

## 2021-11-14 NOTE — Telephone Encounter (Signed)
Pt called and said she believes she moved a kidney stone last Sunday. She said she was experiencing a lot of pain and then when she went to the restroom there was dark clump in the toilet. Which took 2 days for blood to not show anymore in her urine. Patient prefers to get a call from the provider.   FYI - She was very upset about being called by the nurses. She also said she can not easily get in for an appt.

## 2021-11-15 ENCOUNTER — Ambulatory Visit (INDEPENDENT_AMBULATORY_CARE_PROVIDER_SITE_OTHER): Payer: Medicare HMO | Admitting: Family Medicine

## 2021-11-15 ENCOUNTER — Encounter: Payer: Self-pay | Admitting: Family Medicine

## 2021-11-15 VITALS — BP 159/60 | HR 62 | Ht 67.0 in | Wt 160.0 lb

## 2021-11-15 DIAGNOSIS — D692 Other nonthrombocytopenic purpura: Secondary | ICD-10-CM

## 2021-11-15 DIAGNOSIS — R310 Gross hematuria: Secondary | ICD-10-CM | POA: Diagnosis not present

## 2021-11-15 DIAGNOSIS — I10 Essential (primary) hypertension: Secondary | ICD-10-CM | POA: Diagnosis not present

## 2021-11-15 DIAGNOSIS — M810 Age-related osteoporosis without current pathological fracture: Secondary | ICD-10-CM | POA: Diagnosis not present

## 2021-11-15 DIAGNOSIS — M545 Low back pain, unspecified: Secondary | ICD-10-CM

## 2021-11-15 DIAGNOSIS — Z13220 Encounter for screening for lipoid disorders: Secondary | ICD-10-CM | POA: Diagnosis not present

## 2021-11-15 DIAGNOSIS — Z711 Person with feared health complaint in whom no diagnosis is made: Secondary | ICD-10-CM | POA: Diagnosis not present

## 2021-11-15 LAB — POCT URINALYSIS DIP (CLINITEK)
Bilirubin, UA: NEGATIVE
Blood, UA: NEGATIVE
Glucose, UA: NEGATIVE mg/dL
Ketones, POC UA: NEGATIVE mg/dL
Nitrite, UA: NEGATIVE
POC PROTEIN,UA: NEGATIVE
Spec Grav, UA: 1.01 (ref 1.010–1.025)
Urobilinogen, UA: 2 E.U./dL — AB
pH, UA: 6.5 (ref 5.0–8.0)

## 2021-11-15 NOTE — Progress Notes (Signed)
Acute Office Visit  Subjective:     Patient ID: [name], female    DOB: 01-Sep-1926, 88 y.o.   MRN: [id number]  Chief Complaint  Patient presents with   Abdominal Pain   Back Pain    HPI Patient is in today for blood in urine.at the beach around 5[place]21 and was having right low back pain and radiated to the Right lower ad.  Now having some back pain in her tail bone area.  Hx of kidney stones about 40 years ago. when she went to the restroom there was dark clump in the toilet. Which took 2 days for blood to not show anymore in her urine.   She still having some discomfort in her tailbone area but she also sits a lot during the day and she knows that can aggravate her back as well.  She does not feel as bad as she did initially.  And the blood and cloudy urine seem to have resolved.  We had also ordered some labs when she was last here but had not had a chance to return to get this done so would like to have this drawn today if at all possible.  Also deals with a lot of easy bruising on her arms and legs.  Is also been battling some planter fasciitis in her right foot and has been working with podiatry on this.    ROS      Objective:    BP (!) 159/60   Pulse 62   Ht 5\' 7"  (1.702 m)   Wt 160 lb (72.6 kg)   SpO2 96%   BMI 25.06 kg/m    Physical Exam Vitals and nursing note reviewed.  Constitutional:      Appearance: She is well-developed.  HENT:     Head: Normocephalic and atraumatic.  Cardiovascular:     Rate and Rhythm: Normal rate and regular rhythm.     Heart sounds: Murmur heard.     Comments: 3/6 SEM Pulmonary:     Effort: Pulmonary effort is normal.     Breath sounds: Normal breath sounds.  Skin:    General: Skin is warm and dry.  Neurological:     Mental Status: She is alert and oriented to person, place, and time.  Psychiatric:        Behavior: Behavior normal.    Results for orders placed or performed in visit on 11/15/21  POCT URINALYSIS DIP  (CLINITEK)  Result Value Ref Range   Color, UA yellow yellow   Clarity, UA cloudy (A) clear   Glucose, UA negative negative mg/dL   Bilirubin, UA negative negative   Ketones, POC UA negative negative mg/dL   Spec Grav, UA 11/17/21 6.237 - 1.025   Blood, UA negative negative   pH, UA 6.5 5.0 - 8.0   POC PROTEIN,UA negative negative, trace   Urobilinogen, UA 2.0 (A) 0.2 or 1.0 E.U./dL   Nitrite, UA Negative Negative   Leukocytes, UA Small (1+) (A) Negative        Assessment & Plan:   Problem List Items Addressed This Visit       Cardiovascular and Mediastinum   Purpura senilis (HCC)    Discussed diagnosis and that there is no specific treatment for this.  He had it as a condition associated with aging.       Other Visit Diagnoses     Hematuria, gross    -  Primary   Relevant Orders   POCT URINALYSIS  DIP (CLINITEK) (Completed)   Acute bilateral low back pain without sciatica       Concern about end of life           Low back pain with episode of hematuria that lasted about 2 days-most consistent with a kidney stone.  But we did go ahead and check a urine today just to rule out other persisting blood or infection.  The urinalysis actually looks very reassuring.  We discussed that if her symptoms recur then I would love to get her back in for some additional imaging if needed.  She did have a prior history of kidney stones most 40 years ago.  We did have a discussion today in regards to what she would want towards the end of life.  She had mentioned that her 2 closest living relatives had actually passed away and she is the only remaining child out of 10 siblings and their spouses.  She said that she has spoken with with her family about being a DO NOT RESUSCITATE.  She says she has "packed her bags and is ready to go when it happens"  No orders of the defined types were placed in this encounter.   No follow-ups on file.  [name], MD

## 2021-11-15 NOTE — Patient Instructions (Signed)
You can also try naproxen (Aleve)  for your Tonya [name] if you would like in place of the ibuprofen and just see if you feel like it is more helpful.

## 2021-11-15 NOTE — Assessment & Plan Note (Signed)
Discussed diagnosis and that there is no specific treatment for this.  He had it as a condition associated with aging.

## 2021-11-16 LAB — COMPLETE METABOLIC PANEL WITH GFR
AG Ratio: 2 (calc) (ref 1.0–2.5)
ALT: 12 U/L (ref 6–29)
AST: 19 U/L (ref 10–35)
Albumin: 4.5 g/dL (ref 3.6–5.1)
Alkaline phosphatase (APISO): 72 U/L (ref 37–153)
BUN: 14 mg/dL (ref 7–25)
CO2: 34 mmol/L — ABNORMAL HIGH (ref 20–32)
Calcium: 9.5 mg/dL (ref 8.6–10.4)
Chloride: 97 mmol/L — ABNORMAL LOW (ref 98–110)
Creat: 0.89 mg/dL (ref 0.60–0.95)
Globulin: 2.3 g/dL (calc) (ref 1.9–3.7)
Glucose, Bld: 85 mg/dL (ref 65–99)
Potassium: 3.5 mmol/L (ref 3.5–5.3)
Sodium: 140 mmol/L (ref 135–146)
Total Bilirubin: 1.4 mg/dL — ABNORMAL HIGH (ref 0.2–1.2)
Total Protein: 6.8 g/dL (ref 6.1–8.1)
eGFR: 61 mL/min/{1.73_m2} (ref >=60)

## 2021-11-16 LAB — VITAMIN D 25 HYDROXY (VIT D DEFICIENCY, FRACTURES): Vit D, 25-Hydroxy: 37 ng/mL (ref 30–100)

## 2021-11-16 LAB — LIPID PANEL
Cholesterol: 196 mg/dL (ref <200)
HDL: 62 mg/dL (ref >=50)
LDL Cholesterol (Calc): 111 mg/dL (calc) — ABNORMAL HIGH
Non-HDL Cholesterol (Calc): 134 mg/dL (calc) — ABNORMAL HIGH (ref <130)
Total CHOL/HDL Ratio: 3.2 (calc) (ref <5.0)
Triglycerides: 120 mg/dL (ref <150)

## 2021-11-16 NOTE — Progress Notes (Signed)
Patient: Kidney function is stable.  Liver function looks normal.  Cholesterol just slightly elevated.  Vitamin D looks better.  Continue with your vitamin D supplement.  Please see if she would consider getting a bone density test.

## 2021-11-17 NOTE — Progress Notes (Signed)
OK I understand. Thank you

## 2021-11-30 ENCOUNTER — Other Ambulatory Visit: Payer: Self-pay | Admitting: Family Medicine

## 2021-11-30 DIAGNOSIS — F411 Generalized anxiety disorder: Secondary | ICD-10-CM

## 2021-12-04 ENCOUNTER — Telehealth: Payer: Self-pay

## 2021-12-04 NOTE — Telephone Encounter (Signed)
Routing to covering provider -   Patient called after hours. Per on call nurse, patient was concerned with rapid pulse. She stated that provider informed her of a heart murmur at her last visit. Patient states feeling ok overall while she is at rest. However, her heart rate went up to 140 while she was getting dressed. Denies SOB. Patient was advised to go the the nearest ER. She declined, stating she would call a family member who is a doctor before going to the ER.

## 2021-12-05 NOTE — Telephone Encounter (Signed)
Contacted patient, no longer having symptoms since calling the after hour on call nurse. Denies any symptoms of SOB, lightheadedness, or dizziness. Patient advised of covering provider's note. Agreeable with plan of care. Patient scheduled for a same day appointment on 12/08/21 at 340 pm.

## 2021-12-08 ENCOUNTER — Ambulatory Visit: Payer: Medicare HMO | Admitting: Family Medicine

## 2021-12-08 ENCOUNTER — Other Ambulatory Visit: Payer: Self-pay | Admitting: *Deleted

## 2021-12-08 MED ORDER — ESCITALOPRAM OXALATE 10 MG PO TABS: ORAL_TABLET | ORAL | 1 refills | Status: DC

## 2021-12-11 ENCOUNTER — Encounter: Payer: Self-pay | Admitting: Family Medicine

## 2021-12-11 ENCOUNTER — Ambulatory Visit (INDEPENDENT_AMBULATORY_CARE_PROVIDER_SITE_OTHER): Payer: Medicare HMO | Admitting: Family Medicine

## 2021-12-11 VITALS — BP 126/65 | HR 72 | Resp 18 | Ht 67.0 in | Wt 161.0 lb

## 2021-12-11 DIAGNOSIS — I10 Essential (primary) hypertension: Secondary | ICD-10-CM

## 2021-12-11 DIAGNOSIS — R002 Palpitations: Secondary | ICD-10-CM | POA: Diagnosis not present

## 2021-12-11 DIAGNOSIS — D692 Other nonthrombocytopenic purpura: Secondary | ICD-10-CM

## 2021-12-11 DIAGNOSIS — R011 Cardiac murmur, unspecified: Secondary | ICD-10-CM

## 2021-12-11 DIAGNOSIS — R000 Tachycardia, unspecified: Secondary | ICD-10-CM | POA: Diagnosis not present

## 2021-12-11 NOTE — Assessment & Plan Note (Signed)
She is having intermittent episodes of tachycardia with activity.  EKG today shows rate of 72 bpm, sinus rhythm with an occasional PVC.  Otherwise no worrisome findings.  Recommend further evaluation with echocardiogram and Zio patch.  She thinks 1 week would be long enough to catch an episode.  Labs are up-to-date.  Printed a copy of those to give to her today.

## 2021-12-18 NOTE — Addendum Note (Signed)
Addended by: Chalmers Cater on: 12/18/2021 11:40 AM   Modules accepted: Orders

## 2022-01-02 ENCOUNTER — Other Ambulatory Visit (HOSPITAL_BASED_OUTPATIENT_CLINIC_OR_DEPARTMENT_OTHER): Payer: Medicare HMO

## 2022-01-15 ENCOUNTER — Telehealth: Payer: Self-pay | Admitting: Family Medicine

## 2022-01-15 DIAGNOSIS — I10 Essential (primary) hypertension: Secondary | ICD-10-CM

## 2022-01-15 MED ORDER — HYDROCHLOROTHIAZIDE 12.5 MG PO CAPS: ORAL_CAPSULE | ORAL | 1 refills | Status: DC

## 2022-01-15 NOTE — Telephone Encounter (Signed)
Patient called and stated she needed a refill on her BP medication hydrochlorothiazide (MICROZIDE) 12.5 MG capsule. She said her pharmacy told her they were unable to reach after several tries. - CF

## 2022-01-15 NOTE — Telephone Encounter (Signed)
No problem, refilling now.

## 2022-02-05 DIAGNOSIS — M170 Bilateral primary osteoarthritis of knee: Secondary | ICD-10-CM | POA: Diagnosis not present

## 2022-02-06 ENCOUNTER — Telehealth: Payer: Self-pay

## 2022-02-06 DIAGNOSIS — G8929 Other chronic pain: Secondary | ICD-10-CM

## 2022-02-06 NOTE — Telephone Encounter (Signed)
[name] called and wanted to know if Dr Linford Arnold would prescribe Celebrex for the knee pain. Dr Earma Reading recommended Celebrex.

## 2022-02-07 MED ORDER — CELECOXIB 200 MG PO CAPS: 200.0000 mg | ORAL_CAPSULE | Freq: Two times a day (BID) | ORAL | 1 refills | Status: DC | PRN

## 2022-02-07 NOTE — Telephone Encounter (Signed)
Task completed. Patient was upset that she was not called sooner regarding her request for celebrex rx. She was informed that the clinic is currently short-staff in clinic coverage and an apology was extended to the patient. She was reminded that for future request to allow 24 - 48 hours for the request. Patient will stop by [facility] pharmacy for the rx. Patient did not have any other inquiries during the call.

## 2022-02-07 NOTE — Telephone Encounter (Signed)
Med sent. Can be taken once or twice a day as needed.    Meds ordered this encounter  Medications   celecoxib (CELEBREX) 200 MG capsule    Sig: Take 1 capsule (200 mg total) by mouth 2 (two) times daily as needed.    Dispense:  60 capsule    Refill:  1

## 2022-02-22 ENCOUNTER — Telehealth: Payer: Self-pay | Admitting: Family Medicine

## 2022-02-22 NOTE — Telephone Encounter (Signed)
Ms. Sponsel called stated she needed documentation sent to [facility] for her son who is her care giver to not serve on Jury duty. They're requesting he is the primary care giver and she can't be left alone. She asked it been sent over via email by 02-26-2022 it is Guilford Jurer [email or web address]. Glennon Mac H. Oswaldo Done

## 2022-02-22 NOTE — Telephone Encounter (Signed)
Okay, I do not mind doing the letter but this needs to be documented in his chart and I also need his juror number

## 2022-02-23 ENCOUNTER — Other Ambulatory Visit: Payer: Self-pay | Admitting: Family Medicine

## 2022-02-23 DIAGNOSIS — F411 Generalized anxiety disorder: Secondary | ICD-10-CM

## 2022-03-02 NOTE — Telephone Encounter (Signed)
LVM informing Tonya [name] that his letter is ready for pick up at the front desk.  [name], CMA

## 2022-03-22 ENCOUNTER — Other Ambulatory Visit: Payer: Self-pay | Admitting: Family Medicine

## 2022-03-22 DIAGNOSIS — F411 Generalized anxiety disorder: Secondary | ICD-10-CM

## 2022-04-06 ENCOUNTER — Telehealth (INDEPENDENT_AMBULATORY_CARE_PROVIDER_SITE_OTHER): Payer: Medicare HMO | Admitting: Family Medicine

## 2022-04-06 ENCOUNTER — Encounter: Payer: Self-pay | Admitting: Family Medicine

## 2022-04-06 VITALS — HR 66

## 2022-04-06 DIAGNOSIS — M25561 Pain in right knee: Secondary | ICD-10-CM

## 2022-04-06 DIAGNOSIS — G8929 Other chronic pain: Secondary | ICD-10-CM

## 2022-04-06 DIAGNOSIS — T148XXA Other injury of unspecified body region, initial encounter: Secondary | ICD-10-CM

## 2022-04-06 DIAGNOSIS — M79671 Pain in right foot: Secondary | ICD-10-CM

## 2022-04-06 DIAGNOSIS — M25562 Pain in left knee: Secondary | ICD-10-CM

## 2022-04-06 DIAGNOSIS — F411 Generalized anxiety disorder: Secondary | ICD-10-CM

## 2022-04-06 DIAGNOSIS — M79672 Pain in left foot: Secondary | ICD-10-CM

## 2022-04-06 DIAGNOSIS — D692 Other nonthrombocytopenic purpura: Secondary | ICD-10-CM

## 2022-04-06 MED ORDER — ALPRAZOLAM 0.25 MG PO TABS: ORAL_TABLET | ORAL | 2 refills | Status: DC

## 2022-04-06 MED ORDER — MUPIROCIN 2 % EX OINT: TOPICAL_OINTMENT | Freq: Two times a day (BID) | CUTANEOUS | 0 refills | Status: DC

## 2022-04-06 NOTE — Assessment & Plan Note (Signed)
History of panic attacks-we have really weaned her down on the alprazolam over the years she is still taking 25 tabs per month.  Some nights splitting them.  Went ahead and sent a new prescription for her but again reminded her to use very sparingly as it does increase risk for dementia and falls.  She is well aware.

## 2022-04-06 NOTE — Progress Notes (Signed)
Virtual Visit via Telephone Note  I connected with [place] on 04/06/22 at  1:40 PM EDT by telephone and verified that I am speaking with the correct person using two identifiers.   I discussed the limitations of evaluation and management by telemedicine and the availability of in person appointments. The patient expressed understanding and agreed to proceed.  Patient location: at home Provider location: in office  Subjective:    CC:   Chief Complaint  Patient presents with   Blister    HPI:  [place]. Family takes her bc they don't want to be alone if she really prefers to stay in her own home.  Has seen Dr. Michaelle Birks about 8 weeks ago for f/u of bilateral knee injections. Had repeat injection done that day. Still struggling with pain.  Hasn't tried the celebex bc worries about side effects. Advill does help her pain. Tylenol doesn't help.   C/o her plantar fasciitis - Saw Dr. Emogene Morgan Daws for right foot pain in 02/2022. She has already been to 4 podiatrists over the last several years that the last person she saw was a year ago.  She says now it is in both feet and it just really makes it very painful to walk.  Gets a "blood spots" close to her ankle. Thinks they come up at night. Recently had one that popped and drained. Sometime gets hot.  Occ using neosporin.  Occ breaks open.  She had wool socks on that were rubbing.    Past medical history, Surgical history, Family history not pertinant except as noted below, Social history, Allergies, and medications have been entered into the medical record, reviewed, and corrections made.    Objective:    General: Speaking clearly in complete sentences without any shortness of breath.  Alert and oriented x3.  Normal judgment. No apparent acute distress.    Impression and Recommendations:    Problem List Items Addressed This Visit       Cardiovascular and Mediastinum   Purpura senilis ([place]) -  Primary    9 condition though it sounds like she did have one that bit blistered      Relevant Medications   mupirocin ointment (BACTROBAN) 2 %     Other   Bilateral knee pain    Shot are only helping for about month. Then wear off.  She feels like PT hasn't been that helpful in the past. Has had 2 companies come out. She doesn't really do the exercises.       ANXIETY DISORDER, GENERALIZED    History of panic attacks-we have really weaned her down on the alprazolam over the years she is still taking 25 tabs per month.  Some nights splitting them.  Went ahead and sent a new prescription for her but again reminded her to use very sparingly as it does increase risk for dementia and falls.  She is well aware.      Relevant Medications   ALPRAZolam (XANAX) 0.25 MG tablet (Start on 04/22/2022)   Other Visit Diagnoses     Blister       Relevant Medications   mupirocin ointment (BACTROBAN) 2 %   Foot pain, bilateral       Relevant Orders   Ambulatory referral to Podiatry       Lateral foot pain-we will refer to podiatry for further evaluation.  She feels like it is the plantar fasciitis which she has had before but I did  not see her in person to evaluate the foot pain further.  Blister-normally she gets some bruising from senile purpura but it sounds like this particular lesion blistered when she wore a pair of old socks.  We will send over prescription for mupirocin ointment to use instead of the Neosporin and just use as needed if she develops new blisters.  Orders Placed This Encounter  Procedures   Ambulatory referral to Podiatry    Referral Priority:   Routine    Referral Type:   Consultation    Referral Reason:   Specialty Services Required    Requested Specialty:   Podiatry    Number of Visits Requested:   1    Meds ordered this encounter  Medications   mupirocin ointment (BACTROBAN) 2 %    Sig: Apply topically 2 (two) times daily. .    Dispense:  22 g    Refill:  0    DISCONTD: ALPRAZolam (XANAX) 0.25 MG tablet    Sig: TAKE 1 TABLET(0.25 MG) BY MOUTH DAILY AS NEEDED FOR ANXIETY.  This is a 30 days supply    Dispense:  28 tablet    Refill:  2   DISCONTD: ALPRAZolam (XANAX) 0.25 MG tablet    Sig: TAKE 1 TABLET(0.25 MG) BY MOUTH DAILY AS NEEDED FOR ANXIETY.  This is a 30 days supply    Dispense:  28 tablet    Refill:  2   ALPRAZolam (XANAX) 0.25 MG tablet    Sig: TAKE 1 TABLET(0.25 MG) BY MOUTH DAILY AS NEEDED FOR ANXIETY,This is a 30 days supply    Dispense:  28 tablet    Refill:  2     I discussed the assessment and treatment plan with the patient. The patient was provided an opportunity to ask questions and all were answered. The patient agreed with the plan and demonstrated an understanding of the instructions.   The patient was advised to call back or seek an in-person evaluation if the symptoms worsen or if the condition fails to improve as anticipated.  I spent 25 minutes on the day of the encounter to include pre-visit record review, face-to-face time with the patient and post visit ordering of test.    Beatrice Lecher, MD

## 2022-04-06 NOTE — Assessment & Plan Note (Signed)
9 condition though it sounds like she did have one that bit blistered

## 2022-04-06 NOTE — Progress Notes (Signed)
Pt stated that she went to the mountains and she was wearing some "wool" stockings that day. When she got ready for bed she took them off and blood and clear liquid came out. Her daughter cleaned it and put Neosporin and a bandage on it. She has since stopped both of these.   Pt wants Dr. Madilyn Fireman to refer her to a podiatrist for Plantar fasciitis. She said that she has been to 4 different podiatrist

## 2022-04-06 NOTE — Assessment & Plan Note (Signed)
Shot are only helping for about month. Then wear off.  She feels like PT hasn't been that helpful in the past. Has had 2 companies come out. She doesn't really do the exercises.

## 2022-04-16 ENCOUNTER — Telehealth (INDEPENDENT_AMBULATORY_CARE_PROVIDER_SITE_OTHER): Payer: Medicare HMO | Admitting: Physician Assistant

## 2022-04-16 ENCOUNTER — Encounter: Payer: Self-pay | Admitting: Physician Assistant

## 2022-04-16 VITALS — HR 80 | Temp 99.3°F | Ht 67.0 in | Wt 160.0 lb

## 2022-04-16 DIAGNOSIS — J0140 Acute pansinusitis, unspecified: Secondary | ICD-10-CM

## 2022-04-16 DIAGNOSIS — F411 Generalized anxiety disorder: Secondary | ICD-10-CM

## 2022-04-16 DIAGNOSIS — J069 Acute upper respiratory infection, unspecified: Secondary | ICD-10-CM | POA: Diagnosis not present

## 2022-04-16 MED ORDER — AZITHROMYCIN 250 MG PO TABS: ORAL_TABLET | ORAL | 0 refills | Status: DC

## 2022-04-16 NOTE — Progress Notes (Signed)
Son started getting sick last Tuesday - saw Dr. Mel Almond on Friday   Patient started feeling bad Sunday Sore throat Coughing (productive, tint of yellow, mostly clear)  Taking Advil, Delsym, and Flonase for symptoms cool mist honey cough  Has not taken a Covid test [phone]

## 2022-04-16 NOTE — Progress Notes (Signed)
Son started getting sick last Tuesday - saw Dr. Mel Almond on Friday   Patient started feeling bad Sunday Sore throat Coughing (productive, tint of yellow, mostly clear)  Taking Advil, Delsym, and Flonase for symptoms  Has not taken a Covid test

## 2022-04-16 NOTE — Progress Notes (Signed)
..Virtual Visit via Telephone Note  I connected with [place] on 04/16/22 at  1:00 PM EDT by telephone and verified that I am speaking with the correct person using two identifiers.  Location: Patient: home Provider: clinic  .Marland KitchenParticipating in visit:  Patient: Tonya [name] Patient daughter: Tonya [name] Provider: Iran Planas PA-C   I discussed the limitations, risks, security and privacy concerns of performing an evaluation and management service by telephone and the availability of in person appointments. I also discussed with the patient that there may be a patient responsible charge related to this service. The patient expressed understanding and agreed to proceed.   History of Present Illness: Pt is a 88 yo female with 2 days of cough, congestion, headache, ST. Her son was sick last week with similar symptoms. She tested negative for covid with home test. She is taking advil, delsym, flonase, and honey cough syrup. Sputum is mostly clear but some has started to look a little yellow. No fever, chills, body aches, nausea, vomiting or diarrhea. She denies any chest tightness or shortness of breath.   Pt's daughter request her xanax be refilled a few days early. She takes it at night and it really helps her to calm down a sleep. It is not due to be refilled until 11/5.     .. Active Ambulatory Problems    Diagnosis Date Noted   ANXIETY DISORDER, GENERALIZED 05/07/2006   POSITIONAL VERTIGO 09/04/2006   HYPERTENSION, BENIGN 05/07/2006   ALLERGIC RHINITIS CAUSE UNSPECIFIED 10/01/2008   GERD 07/12/2006   IBS 06/30/2008   CYST AND PSEUDOCYST OF PANCREAS 05/31/2008   SHOULDER PAIN, RIGHT 01/14/2007   SCIATICA 05/04/2010   Osteoporosis 01/14/2007   Hernia, hiatal 06/17/2015   Bilateral knee pain 09/13/2015   Medial meniscus tear 09/21/2015   Gastroesophageal reflux disease with esophagitis 05/01/2016   Hammer toes of both feet 07/26/2017   Metatarsalgia of both feet 07/26/2017    Dependent edema 07/26/2017   Weakness of both lower extremities 04/05/2021   Chronic cough 04/05/2021   Purpura senilis ([place]) 11/15/2021   Heart murmur 12/11/2021   Palpitations 12/11/2021   Resolved Ambulatory Problems    Diagnosis Date Noted   Dysuria 10/02/2007   Fracture of humerus, left, closed 08/26/2013   Acute appendicitis 02/16/2018   Past Medical History:  Diagnosis Date   Anxiety    Cystocele    Degenerative disc disease    Hypertension    IBS (irritable bowel syndrome)    Rectocele        Observations/Objective: No acute distress Productive cough Hoarse voice  .Marland Kitchen Today's Vitals   04/16/22 1125  Pulse: 80  Temp: 99.3 F (37.4 C)  SpO2: 93%  Weight: 160 lb (72.6 kg)  Height: 5\' 7"  (1.702 m)   Body mass index is 25.06 kg/m.    Assessment and Plan: Marland KitchenMarland KitchenDiagnoses and all orders for this visit:  URI with cough and congestion  Acute non-recurrent pansinusitis -     azithromycin (ZITHROMAX Z-PAK) 250 MG tablet; Take 2 tablets (500 mg) on  Day 1,  followed by 1 tablet (250 mg) once daily on Days 2 through 5.  ANXIETY DISORDER, GENERALIZED   Symptoms consistent with viral infection  Covid home test negative Start mucinex, continue delsym, use nasal saline rinse if not improving in 1-2 days consider starting zpak due to age and risk of complication Discussed deep breathing exercises Follow up as needed or if symptoms persist.   ..PDMP reviewed during this encounter. Pt got  28 tablets of xanax on 03/22/2022  She has been taking them every night Next refill is not until 11/5 She should have 2 more tablets left if taking every night. Daughter is asking that they be refilled early I said ok but needs to follow up with PCP next month discuss future refills Called the pharmacy and they have strict orders for Dr. Linford Arnold to not refill early She is going to have to wait until Dr. Linford Arnold returns on Wednesday to address the xanax issue   Follow Up  Instructions:    I discussed the assessment and treatment plan with the patient. The patient was provided an opportunity to ask questions and all were answered. The patient agreed with the plan and demonstrated an understanding of the instructions.   The patient was advised to call back or seek an in-person evaluation if the symptoms worsen or if the condition fails to improve as anticipated.  I provided 10 minutes of non-face-to-face time during this encounter.   Tandy Gaw, PA-C

## 2022-04-20 ENCOUNTER — Ambulatory Visit: Payer: Medicare HMO | Admitting: Podiatrist

## 2022-05-07 DIAGNOSIS — M170 Bilateral primary osteoarthritis of knee: Secondary | ICD-10-CM | POA: Diagnosis not present

## 2022-05-08 ENCOUNTER — Other Ambulatory Visit: Payer: Self-pay | Admitting: Family Medicine

## 2022-05-08 DIAGNOSIS — G8929 Other chronic pain: Secondary | ICD-10-CM

## 2022-05-23 ENCOUNTER — Other Ambulatory Visit: Payer: Self-pay | Admitting: Family Medicine

## 2022-06-01 ENCOUNTER — Telehealth: Payer: Self-pay | Admitting: Family Medicine

## 2022-06-01 NOTE — Telephone Encounter (Signed)
Ok to fill early on Monday. Pls remind her to take a half a tab a couple of times a month, instead of whole tab

## 2022-06-01 NOTE — Telephone Encounter (Signed)
Called pt's pharmacy and spoke w/Lane and gave ok for the Xanax 0.25 mg to be released early on Monday December 18th.    Called pt an informed her that I did speak with  Dr. Linford Arnold about her concerns regarding her Xanax and that she would be seeking care elsewhere.   Pt was advised that Dr. Linford Arnold was regretful to hear that she would be seeking another provider but did understand. Pt told that Dr. Linford Arnold would release her Xanax early but this would be the LAST time that she would do this and to understand that she cannot ask her to do this again. Pt stated that she was "wigged" out when she noticed on her bottle that the instructions stated that the 28 tablets would need to last her 30 days. I mentioned to her again that Dr. Linford Arnold had been writing this for her for the past 2 years. She stated that it was the first time that she had actually noticed that on her bottle.   She said that she was confused that Dr. 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 She said that Dr. Linford Arnold had told her she would be available to speak to her when she need to talk to her.   She stated that she would try to find a Geriatric doctor somewhere in [facility] after the holiday.

## 2022-06-19 ENCOUNTER — Telehealth: Payer: Self-pay

## 2022-06-19 MED ORDER — AMOXICILLIN 875 MG PO TABS: 875.0000 mg | ORAL_TABLET | Freq: Two times a day (BID) | ORAL | 0 refills | Status: DC

## 2022-06-19 NOTE — Telephone Encounter (Signed)
Please call her daughter back and just verify if this is the same cough that she was seen for at the end of October.  She was given a Z-Pak at that point in time and so I just want to know if she got better and got sick again or if she is just not better in general.  If it really has been going on that long then we need to get a chest x-ray

## 2022-06-19 NOTE — Telephone Encounter (Signed)
Meds ordered this encounter  Medications   amoxicillin (AMOXIL) 875 MG tablet    Sig: Take 1 tablet (875 mg total) by mouth 2 (two) times daily.    Dispense:  14 tablet    Refill:  0   Prescription sent to pharmacy.  But if she is not feeling better then we would like to see her in person.

## 2022-06-20 ENCOUNTER — Telehealth: Payer: Self-pay

## 2022-06-20 NOTE — Telephone Encounter (Signed)
A follow up call was made regarding the patient's current symptoms. Spoke with Neoma Laming (daughter), states that patient is doing much better since taking the amoxicillin rx. Patient's appetite and fluid intake are returning to normal. Denies any fever, chills, body aches, n/v, and/or diarrhea. Per daughter, she will make an appointment to follow up with the provider once patient is feeling better and stronger.

## 2022-06-21 NOTE — Telephone Encounter (Signed)
Patient/daughter called stating that patient was okay this morning but ate a sandwich (BLT) at lunch and has been nauseated ever since. OTC nausea meds make her feel worse. They are asking for something to help with nausea. Okay to send something? No other symptoms - has not thrown up or had diarrhea. [place], [place] [address].

## 2022-06-22 MED ORDER — ONDANSETRON HCL 4 MG PO TABS: 4.0000 mg | ORAL_TABLET | Freq: Three times a day (TID) | ORAL | 0 refills | Status: DC | PRN

## 2022-06-22 NOTE — Telephone Encounter (Signed)
Meds ordered this encounter  Medications   ondansetron (ZOFRAN) 4 MG tablet    Sig: Take 1 tablet (4 mg total) by mouth every 8 (eight) hours as needed for nausea or vomiting.    Dispense:  12 tablet    Refill:  0    

## 2022-06-22 NOTE — Addendum Note (Signed)
Addended by: Beatrice Lecher D on: 06/22/2022 08:55 AM   Modules accepted: Orders

## 2022-06-28 ENCOUNTER — Other Ambulatory Visit: Payer: Self-pay | Admitting: Family Medicine

## 2022-06-28 DIAGNOSIS — F411 Generalized anxiety disorder: Secondary | ICD-10-CM

## 2022-07-02 ENCOUNTER — Encounter: Payer: Self-pay | Admitting: Family Medicine

## 2022-07-02 ENCOUNTER — Telehealth (INDEPENDENT_AMBULATORY_CARE_PROVIDER_SITE_OTHER): Payer: Medicare HMO | Admitting: Family Medicine

## 2022-07-02 DIAGNOSIS — F411 Generalized anxiety disorder: Secondary | ICD-10-CM | POA: Diagnosis not present

## 2022-07-02 DIAGNOSIS — I10 Essential (primary) hypertension: Secondary | ICD-10-CM

## 2022-07-02 MED ORDER — ALPRAZOLAM 0.25 MG PO TABS: ORAL_TABLET | ORAL | 0 refills | Status: AC

## 2022-07-02 MED ORDER — DOXEPIN HCL 10 MG PO CAPS: 10.0000 mg | ORAL_CAPSULE | Freq: Every day | ORAL | 2 refills | Status: DC

## 2022-07-02 NOTE — Assessment & Plan Note (Addendum)
Continue Lexapro.  Discussed options for her daily benzo at night. She has been requesting refills early.  We discussed coming off the xanax completely and switching to doxepin for sleep.  I think this would be a great idea I really like to get her completely off of benzodiazepines and schedule drugs.  I feel that she is starting to have some memory impairment that is affecting her ability to really manage her medications she is been calling early for refills well before the 28-day mark for several months in a row.
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
## 2022-07-02 NOTE — Telephone Encounter (Signed)
Last OV: 04/06/22 Next OV: 07/02/22 Last RF: 04/22/22

## 2022-07-02 NOTE — Assessment & Plan Note (Signed)
Due for updated labwork.

## 2022-07-30 ENCOUNTER — Other Ambulatory Visit: Payer: Self-pay | Admitting: *Deleted

## 2022-07-30 DIAGNOSIS — I10 Essential (primary) hypertension: Secondary | ICD-10-CM

## 2022-07-30 DIAGNOSIS — F411 Generalized anxiety disorder: Secondary | ICD-10-CM

## 2022-07-30 DIAGNOSIS — G8929 Other chronic pain: Secondary | ICD-10-CM

## 2022-07-30 MED ORDER — CELECOXIB 200 MG PO CAPS: ORAL_CAPSULE | ORAL | 1 refills | Status: DC

## 2022-07-30 MED ORDER — ESOMEPRAZOLE MAGNESIUM 40 MG PO CPDR: DELAYED_RELEASE_CAPSULE | ORAL | 3 refills | Status: DC

## 2022-07-30 MED ORDER — DOXEPIN HCL 10 MG PO CAPS: 10.0000 mg | ORAL_CAPSULE | Freq: Every day | ORAL | 2 refills | Status: DC

## 2022-07-30 MED ORDER — HYDROCHLOROTHIAZIDE 12.5 MG PO CAPS: ORAL_CAPSULE | ORAL | 1 refills | Status: DC

## 2022-07-30 MED ORDER — ESCITALOPRAM OXALATE 10 MG PO TABS: 10.0000 mg | ORAL_TABLET | Freq: Every day | ORAL | 1 refills | Status: DC

## 2022-07-30 NOTE — Progress Notes (Signed)
Pt called asking that her medications be sent to [facility]

## 2022-08-20 DIAGNOSIS — M170 Bilateral primary osteoarthritis of knee: Secondary | ICD-10-CM | POA: Diagnosis not present

## 2022-08-20 DIAGNOSIS — Z1330 Encounter for screening examination for mental health and behavioral disorders, unspecified: Secondary | ICD-10-CM | POA: Diagnosis not present

## 2022-10-22 DIAGNOSIS — M48061 Spinal stenosis, lumbar region without neurogenic claudication: Secondary | ICD-10-CM | POA: Diagnosis not present

## 2022-10-22 DIAGNOSIS — M5136 Other intervertebral disc degeneration, lumbar region: Secondary | ICD-10-CM | POA: Diagnosis not present

## 2022-10-22 DIAGNOSIS — G894 Chronic pain syndrome: Secondary | ICD-10-CM | POA: Diagnosis not present

## 2022-10-22 DIAGNOSIS — M47816 Spondylosis without myelopathy or radiculopathy, lumbar region: Secondary | ICD-10-CM | POA: Diagnosis not present

## 2022-10-22 DIAGNOSIS — M170 Bilateral primary osteoarthritis of knee: Secondary | ICD-10-CM | POA: Diagnosis not present

## 2022-10-22 DIAGNOSIS — M5416 Radiculopathy, lumbar region: Secondary | ICD-10-CM | POA: Diagnosis not present

## 2022-12-03 DIAGNOSIS — M170 Bilateral primary osteoarthritis of knee: Secondary | ICD-10-CM | POA: Diagnosis not present

## 2022-12-03 DIAGNOSIS — Z1330 Encounter for screening examination for mental health and behavioral disorders, unspecified: Secondary | ICD-10-CM | POA: Diagnosis not present

## 2023-01-10 ENCOUNTER — Telehealth: Payer: Self-pay

## 2023-01-10 NOTE — Telephone Encounter (Signed)
Is she getting steroid shots in the knee or the viscose shots? Does she know. I do think she should def as the ortho in [facility] too

## 2023-01-10 NOTE — Telephone Encounter (Signed)
Patient called 01/09/2023 7:59pm   States left a message that she was having anxiety/ panic attack and requested a return call but call was not  received.   She is feeling better today .  States she had eaten a salad with honey mustard dressing and started to have nausea, gagging and diarrhea.  Which caused her to have a panic attack.  She is much improved today - no longer has diarrhea, or nausea.   She wanted to let Dr. Linford Arnold know that   she can hardly walk due to R knee pain - she sees Dr. Willette Cluster who told her that the injections she gets for this is all that can be done . She feels the shots are no longer working. Wanting to know if Dr. Eppie Gibson knew of anything else that can be done ( can not have surgery due to age) -  She will see an orthopaedic  in [place]  on 02/26/2023  for plantar fascitis and will speak to him also but wanted to know if anything could be done in the mean time that she could recommend to her.

## 2023-01-14 ENCOUNTER — Ambulatory Visit (INDEPENDENT_AMBULATORY_CARE_PROVIDER_SITE_OTHER): Payer: Medicare HMO | Admitting: Family Medicine

## 2023-01-14 VITALS — BP 148/64 | HR 68 | Ht 67.0 in | Wt 154.0 lb

## 2023-01-14 DIAGNOSIS — R011 Cardiac murmur, unspecified: Secondary | ICD-10-CM

## 2023-01-14 DIAGNOSIS — Z13220 Encounter for screening for lipoid disorders: Secondary | ICD-10-CM

## 2023-01-14 DIAGNOSIS — F411 Generalized anxiety disorder: Secondary | ICD-10-CM | POA: Diagnosis not present

## 2023-01-14 DIAGNOSIS — M810 Age-related osteoporosis without current pathological fracture: Secondary | ICD-10-CM | POA: Diagnosis not present

## 2023-01-14 DIAGNOSIS — D692 Other nonthrombocytopenic purpura: Secondary | ICD-10-CM | POA: Diagnosis not present

## 2023-01-14 DIAGNOSIS — I10 Essential (primary) hypertension: Secondary | ICD-10-CM | POA: Diagnosis not present

## 2023-01-14 DIAGNOSIS — K2100 Gastro-esophageal reflux disease with esophagitis, without bleeding: Secondary | ICD-10-CM | POA: Diagnosis not present

## 2023-01-14 MED ORDER — HYDROCHLOROTHIAZIDE 12.5 MG PO CAPS: ORAL_CAPSULE | ORAL | 1 refills | Status: DC

## 2023-01-14 MED ORDER — ESCITALOPRAM OXALATE 10 MG PO TABS: 10.0000 mg | ORAL_TABLET | Freq: Every day | ORAL | 1 refills | Status: DC

## 2023-01-14 MED ORDER — ALPRAZOLAM 0.25 MG PO TABS: 0.2500 mg | ORAL_TABLET | Freq: Every day | ORAL | 0 refills | Status: DC | PRN

## 2023-01-14 NOTE — Assessment & Plan Note (Signed)
Continue daily PPI.  

## 2023-01-14 NOTE — Assessment & Plan Note (Addendum)
Continue daily lexapro. Will refill xanax for PRN use only.  Reminded her about increased risk fo falls and dementia on the medication.

## 2023-01-14 NOTE — Patient Instructions (Signed)
Please use the xanax only occasionally.   Your blood pressure was high today. Please check your pressure later this week and next week and let us know if still running high

## 2023-01-14 NOTE — Assessment & Plan Note (Signed)
Will check a vitamin D level.  Consdier getting updated DEXA.

## 2023-01-14 NOTE — Assessment & Plan Note (Signed)
Repeat blood pressure was much better but still not quite at goal so did encourage her to check her blood pressure at home this week and next week and let me know if it is running a little better at home.  It is a lot of effort for her to get here and is taxing.

## 2023-01-14 NOTE — Progress Notes (Signed)
Established Patient Office Visit  Subjective   Patient ID: Tonya [name], female    DOB: 1926/08/14  Age: 89 y.o. MRN: [id number]  Chief Complaint  Patient presents with   mood    HPI  Hypertension- Pt denies chest pain, SOB, dizziness, or heart palpitations.  Taking meds as directed w/o problems.  Denies medication side effects.    F/U GAD - last filled the xanax  in December. Had issues at the pharmacy. Her son had been picking it up for her.    Got COVID in Jan and RSV and was very very sick for months. She had a severe productive cough. It has gradually gotten better.    Really struggling with her knees and radicular pain into her right leg and plantar fasciitis in that right foot.  She has had injections already twice this year in her knee with Dr. Willette Cluster.  She has an appointment coming up at [facility] with an podiatrist specifically for the plantar fasciitis.  She did do a consultation at 1 point for possible epidural but decided not to do it.  She has tried multiple over-the-counter things to help with her pain.  She does take Advil, Aleve, Tylenol from time to time she did have a prescription for Celebrex on her med list but says she never actually tried it.  She has even tried just taking cold medicines because sometimes the pain keeps her awake.    ROS    Objective:     BP (!) 148/64 (BP Location: Left Arm, Cuff Size: Normal)   Pulse 68   Ht 5\' 7"  (1.702 m)   Wt 154 lb (69.9 kg)   SpO2 95%   BMI 24.12 kg/m    Physical Exam Vitals and nursing note reviewed.  Constitutional:      Appearance: She is well-developed.  HENT:     Head: Normocephalic and atraumatic.  Cardiovascular:     Rate and Rhythm: Normal rate and regular rhythm.     Heart sounds: Murmur heard.     Comments: 2/6 SEM Pulmonary:     Effort: Pulmonary effort is normal.     Breath sounds: Normal breath sounds.  Skin:    General: Skin is warm and dry.  Neurological:     Mental Status:  She is alert and oriented to person, place, and time.  Psychiatric:        Behavior: Behavior normal.      No results found for any visits on 01/14/23.    The ASCVD Risk score (Arnett DK, et al., 2019) failed to calculate for the following reasons:   The 2019 ASCVD risk score is only valid for ages 7 to 41    Assessment & Plan:   Problem List Items Addressed This Visit       Cardiovascular and Mediastinum   Purpura senilis (HCC)    Using on forearms.  Benign condition.  Will check a CBC though.      Relevant Medications   hydrochlorothiazide (MICROZIDE) 12.5 MG capsule   Other Relevant Orders   Lipid Panel With LDL/HDL Ratio   CMP14+EGFR   CBC with Differential/Platelet   VITAMIN D 25 Hydroxy (Vit-D Deficiency, Fractures)   HYPERTENSION, BENIGN - Primary    Repeat blood pressure was much better but still not quite at goal so did encourage her to check her blood pressure at home this week and next week and let me know if it is running a little better at home.  It is a lot of effort for her to get here and is taxing.      Relevant Medications   hydrochlorothiazide (MICROZIDE) 12.5 MG capsule   Other Relevant Orders   Lipid Panel With LDL/HDL Ratio   CMP14+EGFR   CBC with Differential/Platelet   VITAMIN D 25 Hydroxy (Vit-D Deficiency, Fractures)     Digestive   GERD    Continue daily PPI      Relevant Orders   Lipid Panel With LDL/HDL Ratio   CMP14+EGFR   CBC with Differential/Platelet   VITAMIN D 25 Hydroxy (Vit-D Deficiency, Fractures)     Musculoskeletal and Integument   Osteoporosis    Will check a vitamin D level.  Consdier getting updated DEXA.      Relevant Orders   Lipid Panel With LDL/HDL Ratio   CMP14+EGFR   CBC with Differential/Platelet   VITAMIN D 25 Hydroxy (Vit-D Deficiency, Fractures)     Other   Heart murmur    Has declined additional w/u in the past.       ANXIETY DISORDER, GENERALIZED    Continue daily lexapro. Will refill xanax  for PRN use only.  Reminded her about increased risk fo falls and dementia on the medication.       Relevant Medications   ALPRAZolam (XANAX) 0.25 MG tablet   escitalopram (LEXAPRO) 10 MG tablet   Other Relevant Orders   Lipid Panel With LDL/HDL Ratio   CMP14+EGFR   CBC with Differential/Platelet   Other Visit Diagnoses     Screening, lipid       Relevant Orders   Lipid Panel With LDL/HDL Ratio   CMP14+EGFR   CBC with Differential/Platelet   VITAMIN D 25 Hydroxy (Vit-D Deficiency, Fractures)       Return in about 6 months (around 07/17/2023) for Hypertension.   I spent 45 minutes on the day of the encounter to include pre-visit record review, face-to-face time with the patient and post visit ordering of test.    [name], MD

## 2023-01-14 NOTE — Assessment & Plan Note (Signed)
Using on forearms.  Benign condition.  Will check a CBC though.

## 2023-01-14 NOTE — Assessment & Plan Note (Signed)
Has declined additional w/u in the past.

## 2023-01-15 ENCOUNTER — Ambulatory Visit (INDEPENDENT_AMBULATORY_CARE_PROVIDER_SITE_OTHER): Payer: Medicare HMO | Admitting: Family Medicine

## 2023-01-15 DIAGNOSIS — Z0000 Encounter for general adult medical examination without abnormal findings: Secondary | ICD-10-CM

## 2023-01-15 NOTE — Patient Instructions (Addendum)
MEDICARE ANNUAL WELLNESS VISIT Health Maintenance Summary and Written Plan of Care  [name] ,  Thank you for allowing me to perform your Medicare Annual Wellness Visit and for your ongoing commitment to your health.   Health Maintenance & Immunization History Health Maintenance  Topic Date Due   DTaP/Tdap/Td (2 - Td or Tdap) 06/20/2021   COVID-19 Vaccine (1 - 2023-24 season) 06/18/2023 (Originally 02/16/2022)   Zoster Vaccines- Shingrix (1 of 2) 06/18/2023 (Originally [birth year]/01/1979)   INFLUENZA VACCINE  01/17/2023   Medicare Annual Wellness (AWV)  01/15/2024   Pneumonia Vaccine 102+ Years old  Completed   DEXA SCAN  Completed   HPV VACCINES  Aged Out   Immunization History  Administered Date(s) Administered   Pneumococcal Conjugate-13 02/23/2014   Pneumococcal Polysaccharide-23 08/20/2017   Tdap 06/21/2011    These are the patient goals that we discussed:  Goals Addressed               This Visit's Progress     Patient Stated (pt-stated)        01/15/2023 AWV Goal: Exercise for General Health  Patient will verbalize understanding of the benefits of increased physical activity: Exercising regularly is important. It will improve your overall fitness, flexibility, and endurance. Regular exercise also will improve your overall health. It can help you control your weight, reduce stress, and improve your bone density. Over the next year, patient will increase physical activity as tolerated with a goal of at least 150 minutes of moderate physical activity per week.  You can tell that you are exercising at a moderate intensity if your heart starts beating faster and you start breathing faster but can still hold a conversation. Moderate-intensity exercise ideas include: Walking 1 mile (1.6 km) in about 15 minutes Biking Hiking Golfing Dancing Water aerobics Patient will verbalize understanding of everyday activities that increase physical activity by providing examples like the  following: Yard work, such as: Insurance underwriter Gardening Washing windows or floors Patient will be able to explain general safety guidelines for exercising:  Before you start a new exercise program, talk with your health care provider. Do not exercise so much that you hurt yourself, feel dizzy, or get very short of breath. Wear comfortable clothes and wear shoes with good support. Drink plenty of water while you exercise to prevent dehydration or heat stroke. Work out until your breathing and your heartbeat get faster.          This is a list of Health Maintenance Items that are overdue or due now: Health Maintenance Due  Topic Date Due   DTaP/Tdap/Td (2 - Td or Tdap) 06/20/2021   Td vaccine Bone densitometry screening Shingles vaccine  Patient declined td vaccine, shingles vaccine and the bone density scan.  Orders/Referrals Placed Today: No orders of the defined types were placed in this encounter.  (Contact our referral department at [phone] if you have not spoken with someone about your referral appointment within the next 5 days)    Follow-up Plan Follow-up with Agapito Games, MD as planned Medicare wellness visit in one year.  AVS printed and mailed to the patient.       Health Maintenance, Female Adopting a healthy lifestyle and getting preventive care are important in promoting health and wellness. Ask your health care provider about: The right schedule for you to have regular tests and exams. Things you can do on your  own to prevent diseases and keep yourself healthy. What should I know about diet, weight, and exercise? Eat a healthy diet  Eat a diet that includes plenty of vegetables, fruits, low-fat dairy products, and lean protein. Do not eat a lot of foods that are high in solid fats, added sugars, or sodium. Maintain a healthy weight Body mass index (BMI)  is used to identify weight problems. It estimates body fat based on height and weight. Your health care provider can help determine your BMI and help you achieve or maintain a healthy weight. Get regular exercise Get regular exercise. This is one of the most important things you can do for your health. Most adults should: Exercise for at least 150 minutes each week. The exercise should increase your heart rate and make you sweat (moderate-intensity exercise). Do strengthening exercises at least twice a week. This is in addition to the moderate-intensity exercise. Spend less time sitting. Even light physical activity can be beneficial. Watch cholesterol and blood lipids Have your blood tested for lipids and cholesterol at 87 years of age, then have this test every 5 years. Have your cholesterol levels checked more often if: Your lipid or cholesterol levels are high. You are older than 87 years of age. You are at high risk for heart disease. What should I know about cancer screening? Depending on your health history and family history, you may need to have cancer screening at various ages. This may include screening for: Breast cancer. Cervical cancer. Colorectal cancer. Skin cancer. Lung cancer. What should I know about heart disease, diabetes, and high blood pressure? Blood pressure and heart disease High blood pressure causes heart disease and increases the risk of stroke. This is more likely to develop in people who have high blood pressure readings or are overweight. Have your blood pressure checked: Every 3-5 years if you are 34-74 years of age. Every year if you are 2 years old or older. Diabetes Have regular diabetes screenings. This checks your fasting blood sugar level. Have the screening done: Once every three years after age 19 if you are at a normal weight and have a low risk for diabetes. More often and at a younger age if you are overweight or have a high risk for  diabetes. What should I know about preventing infection? Hepatitis B If you have a higher risk for hepatitis B, you should be screened for this virus. Talk with your health care provider to find out if you are at risk for hepatitis B infection. Hepatitis C Testing is recommended for: Everyone born from [birth year] through 1963. Anyone with known risk factors for hepatitis C. Sexually transmitted infections (STIs) Get screened for STIs, including gonorrhea and chlamydia, if: You are sexually active and are younger than 87 years of age. You are older than 87 years of age and your health care provider tells you that you are at risk for this type of infection. Your sexual activity has changed since you were last screened, and you are at increased risk for chlamydia or gonorrhea. Ask your health care provider if you are at risk. Ask your health care provider about whether you are at high risk for HIV. Your health care provider may recommend a prescription medicine to help prevent HIV infection. If you choose to take medicine to prevent HIV, you should first get tested for HIV. You should then be tested every 3 months for as long as you are taking the medicine. Pregnancy If you are about to  stop having your period (premenopausal) and you may become pregnant, seek counseling before you get pregnant. Take 400 to 800 micrograms (mcg) of folic acid every day if you become pregnant. Ask for birth control (contraception) if you want to prevent pregnancy. Osteoporosis and menopause Osteoporosis is a disease in which the bones lose minerals and strength with aging. This can result in bone fractures. If you are 71 years old or older, or if you are at risk for osteoporosis and fractures, ask your health care provider if you should: Be screened for bone loss. Take a calcium or vitamin D supplement to lower your risk of fractures. Be given hormone replacement therapy (HRT) to treat symptoms of menopause. Follow these  instructions at home: Alcohol use Do not drink alcohol if: Your health care provider tells you not to drink. You are pregnant, may be pregnant, or are planning to become pregnant. If you drink alcohol: Limit how much you have to: 0-1 drink a day. Know how much alcohol is in your drink. In the [place] one drink equals one 12 oz bottle of beer (355 mL), one 5 oz glass of wine (148 mL), or one 1 oz glass of hard liquor (44 mL). Lifestyle Do not use any products that contain nicotine or tobacco. These products include cigarettes, chewing tobacco, and vaping devices, such as e-cigarettes. If you need help quitting, ask your health care provider. Do not use street drugs. Do not share needles. Ask your health care provider for help if you need support or information about quitting drugs. General instructions Schedule regular health, dental, and eye exams. Stay current with your vaccines. Tell your health care provider if: You often feel depressed. You have ever been abused or do not feel safe at home. Summary Adopting a healthy lifestyle and getting preventive care are important in promoting health and wellness. Follow your health care provider's instructions about healthy diet, exercising, and getting tested or screened for diseases. Follow your health care provider's instructions on monitoring your cholesterol and blood pressure. This information is not intended to replace advice given to you by your health care provider. Make sure you discuss any questions you have with your health care provider. Document Revised: 10/24/2020 Document Reviewed: 10/24/2020 Elsevier Patient Education  2024 [employer].

## 2023-01-15 NOTE — Progress Notes (Signed)
Hi [name], LDL cholesterol just a little borderline elevated but not in a worrisome range.  Overall looks good.  Kidney and liver function are also stable.  Blood count looks great no anemia.  Vitamin D is a little on the low end of normal so just make sure you are taking your 25 mcg daily.  This can sometimes be combined with a calcium supplement.  Please remember to get your tetanus vaccine updated at your local pharmacy is free with your Medicare at the pharmacy.

## 2023-01-15 NOTE — Progress Notes (Signed)
MEDICARE ANNUAL WELLNESS VISIT  01/15/2023  Telephone Visit Disclaimer This Medicare AWV was conducted by telephone due to national recommendations for restrictions regarding the COVID-19 Pandemic (e.g. social distancing).  I verified, using two identifiers, that I am speaking with Tonya [name] or their authorized healthcare agent. I discussed the limitations, risks, security, and privacy concerns of performing an evaluation and management service by telephone and the potential availability of an in-person appointment in the future. The patient expressed understanding and agreed to proceed.  Location of Patient: Home Location of Provider (nurse):  In the office.  Subjective:    Tonya [name] is a 89 y.o. female patient of Metheney, Barbarann Ehlers, MD who had a Medicare Annual Wellness Visit today via telephone. Tonya is Retired and lives with their son. she has 3 children. she reports that she is socially active and does interact with friends/family regularly. she is minimally physically active and enjoys reading.  Patient Care Team: Agapito Games, MD as PCP - General     01/15/2023    1:21 PM 09/23/2020   10:07 AM 02/16/2018    4:00 PM 02/16/2018   12:02 AM 10/31/2016    2:01 PM 11/06/2013    1:47 PM  Advanced Directives  Does Patient Have a Medical Advance Directive? Yes Yes Yes Yes No Patient has advance directive, copy in chart  Type of Advance Directive Living will Healthcare Power of [place];Living will Healthcare Power of [place];Living will Healthcare Power of [place];Living will    Does patient want to make changes to medical advance directive? No - Patient declined No - Patient declined No - Patient declined     Copy of Healthcare Power of Attorney in Chart?  No - copy requested No - copy requested No - copy requested    Would patient like information on creating a medical advance directive?     No - Patient declined     Hospital Utilization Over the Past 12  Months: # of hospitalizations or ER visits: 0 # of surgeries: 0  Review of Systems    Patient reports that her overall health is unchanged compared to last year.  History obtained from chart review and the patient  Patient Reported Readings (BP, Pulse, CBG, Weight, etc)  Per patient no change in vitals since last visit, unable to obtain new vitals due to telehealth visit and lack of equipment.  Pain Assessment Pain : 0-10 Pain Score: 9  Pain Type: Chronic pain Pain Location: Knee Pain Orientation: Right Pain Descriptors / Indicators: Constant, Aching Pain Onset: More than a month ago Pain Frequency: Constant Pain Relieving Factors: advil; heat; tylenol  Pain Relieving Factors: advil; heat; tylenol  Current Medications & Allergies (verified) Allergies as of 01/15/2023       Reactions   Amlodipine Other (See Comments)   Ankle swelling.    Codeine    Other reaction(s): Unknown   Tramadol Nausea Only        Medication List        Accurate as of January 15, 2023  1:37 PM. If you have any questions, ask your nurse or doctor.          ALPRAZolam 0.25 MG tablet Commonly known as: XANAX Take 1 tablet (0.25 mg total) by mouth daily as needed for anxiety.   cetirizine 10 MG tablet Commonly known as: ZYRTEC Take 10 mg by mouth daily.   escitalopram 10 MG tablet Commonly known as: LEXAPRO Take 1 tablet (10 mg total)  by mouth daily.   esomeprazole 40 MG capsule Commonly known as: NEXIUM TAKE 1 CAPSULES BY MOUTH DAILY AT 12 NOON   hydrochlorothiazide 12.5 MG capsule Commonly known as: MICROZIDE TAKE 1 CAPSULE(12.5 MG) BY MOUTH DAILY        History (reviewed): Past Medical History:  Diagnosis Date   Anxiety    Cystocele    Degenerative disc disease    Hypertension    IBS (irritable bowel syndrome)    Rectocele    Past Surgical History:  Procedure Laterality Date   ABDOMINAL HYSTERECTOMY     partial   CHOLECYSTECTOMY     LAPAROSCOPIC APPENDECTOMY N/A  02/16/2018   Procedure: APPENDECTOMY LAPAROSCOPIC;  Surgeon: Romie Levee, MD;  Location: [facility];  Service: General;  Laterality: N/A;   Family History  Problem Relation Age of Onset   Depression Other    GER disease Son    Depression Son    Social History   Socioeconomic History   Marital status: Widowed    Spouse name: Not on file   Number of children: 3   Years of education: 59   Highest education level: Some college, no degree  Occupational History   Occupation: Retired  Tobacco Use   Smoking status: Never   Smokeless tobacco: Never  Vaping Use   Vaping status: Never Used  Substance and Sexual Activity   Alcohol use: No   Drug use: No   Sexual activity: Not on file  Other Topics Concern   Not on file  Social History Narrative   retired, 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 She enjoys reading.    Social Determinants of Health   Financial Resource Strain: Low Risk  (01/15/2023)   Overall Financial Resource Strain (CARDIA)    Difficulty of Paying Living Expenses: Not hard at all  Food Insecurity: No Food Insecurity (01/15/2023)   Hunger Vital Sign    Worried About Running Out of Food in the Last Year: Never true    Ran Out of Food in the Last Year: Never true  Transportation Needs: No Transportation Needs (01/15/2023)   PRAPARE - Administrator, Civil Service (Medical): No    Lack of Transportation (Non-Medical): No  Physical Activity: Inactive (01/15/2023)   Exercise Vital Sign    Days of Exercise per Week: 0 days    Minutes of Exercise per Session: 0 min  Stress: No Stress Concern Present (01/15/2023)   [employer] of Occupational Health - Occupational Stress Questionnaire    Feeling of Stress : Not at all  Social Connections: Socially Isolated (01/15/2023)   Social Connection and Isolation Panel [NHANES]    Frequency of Communication with Friends and Family: More than three  times a week    Frequency of Social Gatherings with Friends and Family: More than three times a week    Attends Religious Services: Never    Database administrator or Organizations: No    Attends Banker Meetings: Never    Marital Status: Widowed    Activities of Daily Living    01/15/2023    1:24 PM  In your present state of health, do you have any difficulty performing the following activities:  Hearing? 1  Comment some hearing loss  Vision? 0  Difficulty concentrating or making decisions? 0  Walking or climbing stairs? 1  Comment with assistance  Dressing or bathing? 1  Comment takes a long time;  usually does with assistance  Doing errands, shopping? 1  Comment her children usually takes her to her appts  Preparing Food and eating ? N  Using the Toilet? N  In the past six months, have you accidently leaked urine? N  Comment wears a depends  Do you have problems with loss of bowel control? N  Managing your Medications? N  Managing your Finances? N  Housekeeping or managing your Housekeeping? Y  Comment her son helps with that    Patient Education/ Literacy How often do you need to have someone help you when you read instructions, pamphlets, or other written materials from your doctor or pharmacy?: 1 - Never What is the last grade level you completed in school?: 12th grade and some college  Exercise    Diet Patient reports consuming 3 meals a day and 2 snack(s) a day Patient reports that her primary diet is: Regular Patient reports that she does have regular access to food.   Depression Screen    01/15/2023    1:22 PM 01/14/2023    2:05 PM 12/11/2021    2:33 PM 06/26/2021   11:57 AM 10/20/2020    9:51 AM 09/23/2020   10:07 AM 12/03/2019    9:50 AM  PHQ 2/9 Scores  PHQ - 2 Score 3 3 0 0 0 0 3  PHQ- 9 Score 9 9     4      Fall Risk    01/15/2023    1:21 PM 01/14/2023   11:57 AM 12/11/2021    2:32 PM 06/26/2021   11:57 AM 10/20/2020    9:51 AM  Fall Risk    Falls in the past year? 0 0 0 0 0  Number falls in past yr: 0 0 0 0 0  Injury with Fall? 0 0 0 0 0  Risk for fall due to : Impaired mobility No Fall Risks Impaired balance/gait;Impaired mobility No Fall Risks No Fall Risks  Follow up Falls evaluation completed Falls evaluation completed Falls prevention discussed;Falls evaluation completed Falls prevention discussed;Falls evaluation completed Falls evaluation completed;Falls prevention discussed     Objective:  Tonya [name] seemed alert and oriented and she participated appropriately during our telephone visit.  Blood Pressure Weight BMI  BP Readings from Last 3 Encounters:  01/14/23 (!) 148/64  12/11/21 126/65  11/15/21 (!) 159/60   Wt Readings from Last 3 Encounters:  01/14/23 154 lb (69.9 kg)  04/16/22 160 lb (72.6 kg)  12/11/21 161 lb (73 kg)   BMI Readings from Last 1 Encounters:  01/14/23 24.12 kg/m    *Unable to obtain current vital signs, weight, and BMI due to telephone visit type  Hearing/Vision  Tonya did not seem to have difficulty with hearing/understanding during the telephone conversation Reports that she has not had a formal eye exam by an eye care professional within the past year Reports that she has not had a formal hearing evaluation within the past year *Unable to fully assess hearing and vision during telephone visit type  Cognitive Function:    01/15/2023    1:30 PM 09/23/2020   10:16 AM  6CIT Screen  What Year? 0 points 0 points  What month? 0 points 0 points  What time? 0 points 0 points  Count back from 20 0 points 0 points  Months in reverse 0 points 0 points  Repeat phrase 2 points 0 points  Total Score 2 points 0 points   (Normal:0-7, Significant for Dysfunction: >8)  Normal Cognitive  Function Screening: Yes   Immunization & Health Maintenance Record Immunization History  Administered Date(s) Administered   Pneumococcal Conjugate-13 02/23/2014   Pneumococcal Polysaccharide-23  08/20/2017   Tdap 06/21/2011    Health Maintenance  Topic Date Due   DTaP/Tdap/Td (2 - Td or Tdap) 06/20/2021   COVID-19 Vaccine (1 - 2023-24 season) 06/18/2023 (Originally 02/16/2022)   Zoster Vaccines- Shingrix (1 of 2) 06/18/2023 (Originally 01/01/1979)   INFLUENZA VACCINE  01/17/2023   Medicare Annual Wellness (AWV)  01/15/2024   Pneumonia Vaccine 4+ Years old  Completed   DEXA SCAN  Completed   HPV VACCINES  Aged Out       Assessment  This is a routine wellness examination for [employer].  Health Maintenance: Due or Overdue Health Maintenance Due  Topic Date Due   DTaP/Tdap/Td (2 - Td or Tdap) 06/20/2021    Tonya [name] does not need a referral for [employer] Assistance: Care Management:   no Social Work:    no Prescription Assistance:  no Nutrition/Diabetes Education:  no   Plan:  Personalized Goals  Goals Addressed               This Visit's Progress     Patient Stated (pt-stated)        01/15/2023 AWV Goal: Exercise for General Health  Patient will verbalize understanding of the benefits of increased physical activity: Exercising regularly is important. It will improve your overall fitness, flexibility, and endurance. Regular exercise also will improve your overall health. It can help you control your weight, reduce stress, and improve your bone density. Over the next year, patient will increase physical activity as tolerated with a goal of at least 150 minutes of moderate physical activity per week.  You can tell that you are exercising at a moderate intensity if your heart starts beating faster and you start breathing faster but can still hold a conversation. Moderate-intensity exercise ideas include: Walking 1 mile (1.6 km) in about 15 minutes Biking Hiking Golfing Dancing Water aerobics Patient will verbalize understanding of everyday activities that increase physical activity by providing examples like the following: Yard work, such  as: Insurance underwriter Gardening Washing windows or floors Patient will be able to explain general safety guidelines for exercising:  Before you start a new exercise program, talk with your health care provider. Do not exercise so much that you hurt yourself, feel dizzy, or get very short of breath. Wear comfortable clothes and wear shoes with good support. Drink plenty of water while you exercise to prevent dehydration or heat stroke. Work out until your breathing and your heartbeat get faster.        Personalized Health Maintenance & Screening Recommendations  Td vaccine Bone densitometry screening Shingles vaccine  Patient declined td vaccine, shingles vaccine and the bone density scan.  Lung Cancer Screening Recommended: no (Low Dose CT Chest recommended if Age 46-80 years, 20 pack-year currently smoking OR have quit w/in past 15 years) Hepatitis C Screening recommended: no HIV Screening recommended: no  Advanced Directives: Written information was not prepared per patient's request.  Referrals & Orders No orders of the defined types were placed in this encounter.   Follow-up Plan Follow-up with Agapito Games, MD as planned Medicare wellness visit in one year.  AVS printed and mailed to the patient.    I have personally reviewed and noted the following in the patient's chart:  Medical and social history Use of alcohol, tobacco or illicit drugs  Current medications and supplements Functional ability and status Nutritional status Physical activity Advanced directives List of other physicians Hospitalizations, surgeries, and ER visits in previous 12 months Vitals Screenings to include cognitive, depression, and falls Referrals and appointments  In addition, I have reviewed and discussed with [place] A Lykins certain preventive protocols, quality metrics, and best practice  recommendations. A written personalized care plan for preventive services as well as general preventive health recommendations is available and can be mailed to the patient at her request.      Modesto Charon, RN BSN  01/15/2023

## 2023-02-20 DIAGNOSIS — H01021 Squamous blepharitis right upper eyelid: Secondary | ICD-10-CM | POA: Diagnosis not present

## 2023-02-20 DIAGNOSIS — H01024 Squamous blepharitis left upper eyelid: Secondary | ICD-10-CM | POA: Diagnosis not present

## 2023-02-26 DIAGNOSIS — M19071 Primary osteoarthritis, right ankle and foot: Secondary | ICD-10-CM | POA: Diagnosis not present

## 2023-02-26 DIAGNOSIS — M1711 Unilateral primary osteoarthritis, right knee: Secondary | ICD-10-CM | POA: Diagnosis not present

## 2023-02-28 ENCOUNTER — Telehealth: Payer: Self-pay | Admitting: Family Medicine

## 2023-02-28 NOTE — Telephone Encounter (Signed)
Patient dropped off document Handicap Placard, to be filled out by provider. Patient requested to send it back via Call Patient to pick up within 5-days. Document is located in providers tray at front office.Please advise at [facility] [phone]

## 2023-02-28 NOTE — Telephone Encounter (Signed)
Form completed and placed in [place] [facility] basket.

## 2023-03-01 NOTE — Telephone Encounter (Signed)
Pt advised.

## 2023-03-08 ENCOUNTER — Telehealth: Payer: Self-pay | Admitting: Family Medicine

## 2023-03-08 NOTE — Telephone Encounter (Signed)
Patient called she has a fungus on right great toe and she is asking for a prescription to take a prescription for it  Please advise

## 2023-03-11 ENCOUNTER — Telehealth: Payer: Self-pay | Admitting: Family Medicine

## 2023-03-11 DIAGNOSIS — B351 Tinea unguium: Secondary | ICD-10-CM

## 2023-03-11 MED ORDER — TERBINAFINE HCL 250 MG PO TABS: 250.0000 mg | ORAL_TABLET | Freq: Every day | ORAL | 1 refills | Status: DC

## 2023-03-11 NOTE — Telephone Encounter (Signed)
Called pt and advised her that she would need to either do a my chart or come into the office to be seen for this.

## 2023-03-11 NOTE — Telephone Encounter (Signed)
Her son sent [place] a picture of her toenail via MyChart. My response back to him.   "Thank you so much [name] I really appreciate it. This is super helpful I just wanted to make sure that we were treating the right thing for her and it was not something else going on that needed further workup or culture etc. The options would be a oral medication called Lamisil the generic is called terbinafine. And this would be taken daily for probably 6 months. She will need to have her liver function checked in about 1 month on the medication just to make sure that it is not causing too much strain on the liver. And hopefully what we would see over the next several months is that there is new clear healthy nail growing out from the bottom and the old discolored thickened nail will continue to grow out and can be clipped. I will send the prescription over to her pharmacy tonight. "   Meds ordered this encounter  Medications   terbinafine (LAMISIL) 250 MG tablet    Sig: Take 1 tablet (250 mg total) by mouth daily.    Dispense:  90 tablet    Refill:  1

## 2023-03-21 ENCOUNTER — Telehealth: Payer: Self-pay

## 2023-03-21 NOTE — Telephone Encounter (Signed)
Error no encounter needed.
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
## 2023-03-22 NOTE — Telephone Encounter (Signed)
I think needs appt. She could be in afib.  I do rec the RSV for her

## 2023-03-22 NOTE — Telephone Encounter (Signed)
Patient informed. 

## 2023-03-26 ENCOUNTER — Ambulatory Visit (INDEPENDENT_AMBULATORY_CARE_PROVIDER_SITE_OTHER): Payer: Medicare HMO | Admitting: Family Medicine

## 2023-03-26 ENCOUNTER — Encounter: Payer: Self-pay | Admitting: Family Medicine

## 2023-03-26 VITALS — BP 155/94 | HR 71 | Ht 67.0 in | Wt 150.0 lb

## 2023-03-26 DIAGNOSIS — R011 Cardiac murmur, unspecified: Secondary | ICD-10-CM

## 2023-03-26 DIAGNOSIS — I10 Essential (primary) hypertension: Secondary | ICD-10-CM

## 2023-03-26 DIAGNOSIS — I480 Paroxysmal atrial fibrillation: Secondary | ICD-10-CM | POA: Insufficient documentation

## 2023-03-26 MED ORDER — APIXABAN 5 MG PO TABS: 5.0000 mg | ORAL_TABLET | Freq: Two times a day (BID) | ORAL | 1 refills | Status: DC

## 2023-03-26 MED ORDER — METOPROLOL SUCCINATE ER 25 MG PO TB24: 25.0000 mg | ORAL_TABLET | Freq: Every day | ORAL | 1 refills | Status: DC

## 2023-03-26 NOTE — Assessment & Plan Note (Signed)
Had declined w/u in the past. Will go ahead and order echo today.  Check for thyroid dysfunction.

## 2023-03-26 NOTE — Assessment & Plan Note (Addendum)
Mildly symptomatic.  Discussed dx with her and her daughter.   CHADS-VASC score of 4.   Will refer to Cardiology.   Will start Metoprolol 25 XL and Eliquis 5mg  (normal renal function)

## 2023-03-26 NOTE — Assessment & Plan Note (Signed)
BP is still high today. Normally better controlled.
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
## 2023-03-28 DIAGNOSIS — R011 Cardiac murmur, unspecified: Secondary | ICD-10-CM | POA: Diagnosis not present

## 2023-03-28 DIAGNOSIS — I480 Paroxysmal atrial fibrillation: Secondary | ICD-10-CM | POA: Diagnosis not present

## 2023-03-28 DIAGNOSIS — I10 Essential (primary) hypertension: Secondary | ICD-10-CM | POA: Diagnosis not present

## 2023-03-29 LAB — CBC WITH DIFFERENTIAL/PLATELET
Basophils Absolute: 0 10*3/uL (ref 0.0–0.2)
Basos: 1 %
EOS (ABSOLUTE): 0.1 10*3/uL (ref 0.0–0.4)
Eos: 3 %
Hematocrit: 42.2 % (ref 34.0–46.6)
Hemoglobin: 14.3 g/dL (ref 11.1–15.9)
Immature Grans (Abs): 0 10*3/uL (ref 0.0–0.1)
Immature Granulocytes: 1 %
Lymphocytes Absolute: 1 10*3/uL (ref 0.7–3.1)
Lymphs: 18 %
MCH: 31.2 pg (ref 26.6–33.0)
MCHC: 33.9 g/dL (ref 31.5–35.7)
MCV: 92 fL (ref 79–97)
Monocytes Absolute: 0.8 10*3/uL (ref 0.1–0.9)
Monocytes: 14 %
Neutrophils Absolute: 3.5 10*3/uL (ref 1.4–7.0)
Neutrophils: 63 %
Platelets: 189 10*3/uL (ref 150–450)
RBC: 4.59 x10E6/uL (ref 3.77–5.28)
RDW: 12.9 % (ref 11.7–15.4)
WBC: 5.6 10*3/uL (ref 3.4–10.8)

## 2023-03-29 LAB — CMP14+EGFR
ALT: 12 [IU]/L (ref 0–32)
AST: 19 [IU]/L (ref 0–40)
Albumin: 3.8 g/dL (ref 3.6–4.6)
Alkaline Phosphatase: 67 [IU]/L (ref 44–121)
BUN/Creatinine Ratio: 14 (ref 12–28)
BUN: 13 mg/dL (ref 10–36)
Bilirubin Total: 0.9 mg/dL (ref 0.0–1.2)
CO2: 30 mmol/L — ABNORMAL HIGH (ref 20–29)
Calcium: 9.3 mg/dL (ref 8.7–10.3)
Chloride: 100 mmol/L (ref 96–106)
Creatinine, Ser: 0.9 mg/dL (ref 0.57–1.00)
Globulin, Total: 2.1 g/dL (ref 1.5–4.5)
Glucose: 71 mg/dL (ref 70–99)
Potassium: 3.9 mmol/L (ref 3.5–5.2)
Sodium: 142 mmol/L (ref 134–144)
Total Protein: 5.9 g/dL — ABNORMAL LOW (ref 6.0–8.5)
eGFR: 59 mL/min/{1.73_m2} — ABNORMAL LOW (ref >59)

## 2023-03-29 LAB — TSH: TSH: 2.49 u[IU]/mL (ref 0.450–4.500)

## 2023-04-02 NOTE — Progress Notes (Signed)
Call patient: Kidney function looks stable at 0.9.  Liver functions normal.  Thyroid looks great.  Count is normal no sign of anemia or infection.  Protein levels just a little borderline low so just make sure you are getting enough protein in your diet.

## 2023-04-12 ENCOUNTER — Telehealth: Payer: Self-pay | Admitting: Family Medicine

## 2023-04-12 NOTE — Telephone Encounter (Signed)
Patient cancelled her appointment with the Cardiology Dr. Shary Key and scheduled with another Cardiologist Dr, Belva Bertin in [facility] just a [place]

## 2023-04-24 ENCOUNTER — Other Ambulatory Visit: Payer: Self-pay | Admitting: Family Medicine

## 2023-04-29 ENCOUNTER — Ambulatory Visit: Payer: Medicare HMO

## 2023-05-02 ENCOUNTER — Ambulatory Visit (HOSPITAL_BASED_OUTPATIENT_CLINIC_OR_DEPARTMENT_OTHER): Payer: Medicare HMO | Attending: Family Medicine

## 2023-05-21 ENCOUNTER — Other Ambulatory Visit: Payer: Self-pay | Admitting: Family Medicine

## 2023-05-21 DIAGNOSIS — R002 Palpitations: Secondary | ICD-10-CM | POA: Diagnosis not present

## 2023-05-21 DIAGNOSIS — R600 Localized edema: Secondary | ICD-10-CM | POA: Diagnosis not present

## 2023-05-21 DIAGNOSIS — I480 Paroxysmal atrial fibrillation: Secondary | ICD-10-CM | POA: Diagnosis not present

## 2023-05-21 DIAGNOSIS — I10 Essential (primary) hypertension: Secondary | ICD-10-CM | POA: Diagnosis not present

## 2023-05-21 DIAGNOSIS — I499 Cardiac arrhythmia, unspecified: Secondary | ICD-10-CM | POA: Diagnosis not present

## 2023-05-21 DIAGNOSIS — R001 Bradycardia, unspecified: Secondary | ICD-10-CM | POA: Diagnosis not present

## 2023-05-21 DIAGNOSIS — R011 Cardiac murmur, unspecified: Secondary | ICD-10-CM | POA: Diagnosis not present

## 2023-05-22 DIAGNOSIS — R001 Bradycardia, unspecified: Secondary | ICD-10-CM | POA: Diagnosis not present

## 2023-05-24 ENCOUNTER — Telehealth: Payer: Self-pay

## 2023-05-24 NOTE — Telephone Encounter (Signed)
Attempted to contact the patient. No answer. Left a vm msg to contact the clinic to discuss her concerns.

## 2023-05-24 NOTE — Telephone Encounter (Signed)
Copied from CRM [id number]. Topic: Clinical - Medical Advice >> May 23, 2023 10:44 AM Orinda Kenner C wrote: Reason for CRM: Pt went to the cardiologist and doesn't think they understood what Dr. Linford Arnold want; pt is confused and unsure to trust what happen at visit. Pt wants to speak with Dr. Linford Arnold. Pls c/b for guidance [phone].

## 2023-05-29 NOTE — Telephone Encounter (Signed)
Please call her and see what she is concerned about.

## 2023-05-29 NOTE — Telephone Encounter (Signed)
Spoke w/pt and she wanted to know if Dr. Linford Arnold received the notes from the cardiologist. I told her that we did get those notes.  She wanted to let Dr. Linford Arnold know that he told her that he didn't see any a-fib and that she was on the right medications.   For her age if something happened she is taking the right medications and that she could continue taking these if she wanted to.   Ms. Langager wanted to know from Dr. Linford Arnold what her thoughts were about this. She stated that her only side effects that she has experienced from taking the medications has been feeling sleepy.   She also wanted Dr. Linford Arnold to know about her experience with him and his staff. She stated that she felt rushed, and not listened.  She also said that she has not had the ECHO  done.  I told her that I would fwd this to Dr. Linford Arnold for f/u

## 2023-05-30 ENCOUNTER — Telehealth: Payer: Self-pay

## 2023-05-30 NOTE — Telephone Encounter (Signed)
Copied from CRM [id number]. Topic: Clinical - Medical Advice >> May 27, 2023  9:45 AM Tonya [name] wrote: Reason for CRM: Pt called back stated she was expecting call from provider to discuss experience with Caridologist. She did not go to [facility] per referral but went to Dr. Dot Been at [facility] and did not have a good experience. Stated Dr. Catalina Pizza her she did not have afib and that she could continue taking medication if she wanted to and the staff was not hospitable.  She would like to discuss next steps with Dr. Linford Arnold. She does not know if his test was accurate.

## 2023-05-31 NOTE — Telephone Encounter (Signed)
Please see second note I did speak with patient.  Will go ahead and close this 1.

## 2023-05-31 NOTE — Telephone Encounter (Signed)
Called her back.  She says that the episodes of fast heart rate have actually resolved since we put her on the medication.  We discussed doing a Zio patch to confirm if she could be flipping in and out of A-fib.  She is agreeable to that but wants to wait until after Christmas.  Just encouraged her to call me when she is ready and we will place an order.

## 2023-06-03 DIAGNOSIS — M48061 Spinal stenosis, lumbar region without neurogenic claudication: Secondary | ICD-10-CM | POA: Diagnosis not present

## 2023-06-03 DIAGNOSIS — M170 Bilateral primary osteoarthritis of knee: Secondary | ICD-10-CM | POA: Diagnosis not present

## 2023-06-21 ENCOUNTER — Telehealth: Payer: Self-pay

## 2023-06-21 DIAGNOSIS — I480 Paroxysmal atrial fibrillation: Secondary | ICD-10-CM

## 2023-06-21 NOTE — Telephone Encounter (Signed)
 Copied from CRM [id number]. Topic: Clinical - Medication Question >> Jun 21, 2023  1:56 PM Leotis ORN wrote: Reason for CRM: PT went to cardiologist, EKG was performed and stated she does not have atrial fibrillation, she stated that her son went to pick up your ELIQUIS  5 MG TABS tablet, and it was going to cost $297 and she is wondering if she doesn't have atrial fibrillation does she need to take this medication  callback # [phone]

## 2023-06-21 NOTE — Telephone Encounter (Signed)
 Copied from CRM [id number]. Topic: Complaint (DO NOT CONVERT) - Staff >> Jun 21, 2023  4:11 PM Leotis ORN wrote: Date of Incident: 06/21/2023 Details of complaint: my crm from earlier today iv spoken with her all 2 x-   PT went to cardiologist, EKG was performed and stated she does not have atrial fibrillation, she stated that her son went to pick up your ELIQUIS  5 MG TABS tablet, and it was going to cost $297 and she is wondering if she doesn't have atrial fibrillation does she need to take this medication I sent that CRM in today and advised pt that it would take end of day or next business day, she called back, during the second call she requested that she be put on the phone with someone, when calling and explaining to the clinic access line what is going on they placed me on hold for over 30 min's, pt become very agitated and now believes that because of her age she is not a priority. She would like to speak with a higher up because this is regarding her heart and the importance of it.  when i explained to the CAL that she is very elderly and worried because it is related to her heart and that she wanted  they stated they would ask the nurse bc they think she would need to come in, and the pt is now VERY ANGRY for how this is being handled  How would the patient like to see it resolved? She would like an answer to whether take the medication or stop it  On a scale of 1-10, how was your experience? 1 What would it take to bring it to a 10? She would like an answer to whether take the medication or stop it    Route to Research Officer, [employer].

## 2023-06-24 ENCOUNTER — Telehealth: Payer: Self-pay

## 2023-06-24 NOTE — Telephone Encounter (Signed)
 Copied from CRM [id number]. Topic: Clinical - Medical Advice >> Jun 24, 2023  9:03 AM Tonya [name] wrote: Reason for CRM: Patient calling to state she would like a call when we come to a determination on her Eliquis prescription.

## 2023-06-24 NOTE — Telephone Encounter (Signed)
 Please call pt and let her know to hold on filling the medication until I can check with the Cardiologist to see what they are thinking.       Please call Cardiology office, Dr. Maralee to see if they recommend her start the Eliquis .  I think they did not see A-fib when they did her consultation December 3.  We can always send a copy of our EKG that we did here when I saw her prior to December 3 if they do not truly feel she has A-fib then we can hold on the Eliquis .  Patient reports that the cost is quite high.  I know had they had discussed a Zio patch.  He may be wanting additional information before he can say 1 where the other but I just would like his opinion.

## 2023-06-26 MED ORDER — APIXABAN 5 MG PO TABS: 5.0000 mg | ORAL_TABLET | Freq: Two times a day (BID) | ORAL | 1 refills | Status: DC

## 2023-06-26 NOTE — Telephone Encounter (Signed)
 Called and spoke with Tonya [name] 's daughter number is there is been a little bit of back-and-forth confusion.  I think best option would be for her to do a 14-day heart monitor to see if she is truly flipping in and out of A-fib.  As she is questioning whether or not she really has A-fib or not.  I think that that would be reasonable and then if she does have it then we can continue with medication.  Unfortunately Plavix is not going to be an option as that is recommended post stents and not for A-fib.  Eliquis  was approved to move forward with making generics but they have said that there may not technically be available until 2026.  But I will go ahead and send over a request for generic and see if the pharmacy is able to process it.  They also want to check on pricing with her mail order pharmacy as that may be a somewhat cheaper option.  Either way she will still have to pay her deductible.

## 2023-06-26 NOTE — Telephone Encounter (Signed)
 I spoke with Tonya [name]. She isn't sure if she want to do a 14 day heart monitor. The medication is $297. She is not going to pay that amount for the medication. She states her son suggested Plavix. She is still taking the Eliquis and has 10 days worth.

## 2023-06-26 NOTE — Telephone Encounter (Signed)
 See other message

## 2023-06-28 NOTE — Telephone Encounter (Signed)
 Please call patient and let her know that the best option would be to do a 14-day monitor to really see if she is going in and out of A-fib that way we can better determine if she really needs the medicine or not.    Unfortunately Plavix is not going to be an option it is not one of the currently recommended drugs for A-fib at this point.  Medicare patients are having to pay more out-of-pocket at the beginning of the year because they got rid of the Medicare gap starting this year.  So a lot of tier 3 and higher drugs have a little higher initial co-pay for that first fill and then the price does usually go down.  I still do not know what it would ultimately be but it would not be 200+ dollars each time that she fills it.

## 2023-06-28 NOTE — Telephone Encounter (Signed)
 Pt advised. She would like to proceed with holter monitor. She did not mention if she wanted to take the medication.   Fwd to pcp for review and signature of event monitor.

## 2023-07-01 ENCOUNTER — Ambulatory Visit: Payer: Medicare HMO | Attending: Family Medicine

## 2023-07-01 DIAGNOSIS — I480 Paroxysmal atrial fibrillation: Secondary | ICD-10-CM

## 2023-07-01 NOTE — Progress Notes (Unsigned)
 EP to read.

## 2023-07-01 NOTE — Telephone Encounter (Signed)
 Orders Placed This Encounter  Procedures   LONG TERM MONITOR (3-14 DAYS)    Standing Status:   Future    Number of Occurrences:   1    Expiration Date:   06/27/2024    Where should this test be performed?:   [facility]    Does the patient have an implanted cardiac device?:   No    Prescribed days of wear:   14    Type of enrollment:   Home Enrollment    Vendor::   [employer]

## 2023-07-08 ENCOUNTER — Telehealth: Payer: Self-pay

## 2023-07-08 NOTE — Telephone Encounter (Signed)
Is her dental procedure soon? She can wait until after if in the next 2 weeks., to put on th monitor. If she wears it during that is OK too, would need to let her dentist know

## 2023-07-08 NOTE — Telephone Encounter (Signed)
Copied from CRM [id number]. Topic: Clinical - Medical Advice >> Jul 08, 2023  1:22 PM Nila Nephew wrote: Reason for CRM: Patient calling to ask if PCP recommends she wear the heart monitor, where she is going to be exposed to stressful situations (surgery for removal of an infected tooth) and she thinks it may mess with the heart monitor. Please call patient back at [phone].

## 2023-07-08 NOTE — Telephone Encounter (Signed)
Patient advised.

## 2023-07-10 ENCOUNTER — Other Ambulatory Visit: Payer: Self-pay | Admitting: Family Medicine

## 2023-07-12 ENCOUNTER — Other Ambulatory Visit: Payer: Self-pay | Admitting: Family Medicine

## 2023-07-12 DIAGNOSIS — M1711 Unilateral primary osteoarthritis, right knee: Secondary | ICD-10-CM | POA: Diagnosis not present

## 2023-07-17 ENCOUNTER — Telehealth: Payer: Self-pay

## 2023-07-17 NOTE — Telephone Encounter (Signed)
OK to restart Eliquis

## 2023-07-17 NOTE — Telephone Encounter (Signed)
Patient advised.

## 2023-07-17 NOTE — Telephone Encounter (Signed)
We received a fax from the after hours nurse line. It states the patient has thrush.   I called patient. She had a tooth extraction. Now she has thrush. The dentist did prescribe medication to treat the thrush.   She had an appointment tomorrow. She states she is not coming in until she feels better. She would not reschedule at this time. Cancelled appointment.   She did want to know if she can restart the Eliquis. She stopped the day of the tooth extraction. She states most of the bleeding has stopped.

## 2023-07-18 ENCOUNTER — Ambulatory Visit: Payer: Medicare HMO | Admitting: Family Medicine

## 2023-07-19 ENCOUNTER — Other Ambulatory Visit: Payer: Self-pay | Admitting: Family Medicine

## 2023-07-19 DIAGNOSIS — I10 Essential (primary) hypertension: Secondary | ICD-10-CM

## 2023-07-26 ENCOUNTER — Other Ambulatory Visit: Payer: Self-pay | Admitting: Family Medicine

## 2023-07-29 ENCOUNTER — Telehealth: Payer: Self-pay

## 2023-07-29 ENCOUNTER — Telehealth: Payer: Self-pay | Admitting: Family Medicine

## 2023-07-29 NOTE — Telephone Encounter (Signed)
 Copied from CRM [id number]. Topic: Clinical - Prescription Issue >> Jul 29, 2023 11:02 AM Felizardo Hotter wrote: Reason for CRM: Pt called stated Cardiologist said does not have Afib so pt wants to know if she needs to continue taking apixaban  (ELIQUIS ) 5 MG TABS tablet and metoprolol  succinate (TOPROL -XL) 25 MG 24 hr tablet. Please call pt at [phone].

## 2023-07-29 NOTE — Telephone Encounter (Signed)
 Copied from CRM [id number]. Topic: Clinical - Medical Advice >> Jul 29, 2023  3:41 PM Hilton Lucky wrote: Reason for CRM:   Patient is calling in to request medical advice. Patient is getting conflicting information from PCP and Cardiologist.   Patient wants to know if she does or does not have AFIB. Patient is concerned after tooth removal, thrush persisting after surgical antibiotics. Patient wants to know if she does or does not need to take Eliquis . Patient wants to know if she does or does not need to take Toprolol.  Patient would like another EKG.   Offered patient to schedule an appointment, patient declined to schedule due to flu, covid, etc going on right now. Attempted to advise on Eliqius per note on 01/29 - patient disregarded.   Patient would like to have a call placed to her to direct her on the above. Patient states she feels like nobody is communicating with her and she cannot get any answers. Patient states she is confused and would like to speak to somebody to give her clear answers. Please advise.

## 2023-07-31 NOTE — Telephone Encounter (Signed)
Fwd to Dr. Linford Arnold for advice

## 2023-07-31 NOTE — Telephone Encounter (Signed)
Thre is already a separate phone note open in this regard some get a close this 1 the other 1 is still open.

## 2023-07-31 NOTE — Telephone Encounter (Signed)
I would recommend she wear the heart monitor for 10 days to see if she really has it or not. That will tell us if she need to take the Eliquis.

## 2023-08-01 NOTE — Telephone Encounter (Signed)
[name] states she might do the heart monitor and she might not.

## 2023-09-27 ENCOUNTER — Other Ambulatory Visit: Payer: Self-pay | Admitting: Family Medicine

## 2023-10-19 ENCOUNTER — Other Ambulatory Visit: Payer: Self-pay | Admitting: Family Medicine

## 2023-10-19 DIAGNOSIS — I480 Paroxysmal atrial fibrillation: Secondary | ICD-10-CM

## 2023-11-14 DIAGNOSIS — M170 Bilateral primary osteoarthritis of knee: Secondary | ICD-10-CM | POA: Diagnosis not present

## 2023-11-22 ENCOUNTER — Other Ambulatory Visit: Payer: Self-pay | Admitting: Family Medicine

## 2023-12-18 ENCOUNTER — Other Ambulatory Visit: Payer: Self-pay | Admitting: Family Medicine

## 2023-12-18 DIAGNOSIS — I480 Paroxysmal atrial fibrillation: Secondary | ICD-10-CM

## 2023-12-20 NOTE — Telephone Encounter (Signed)
Please call pt for ov thanks

## 2023-12-21 NOTE — Telephone Encounter (Signed)
 Pls schedule for OV. Needs a 40 min. OKfor August

## 2024-01-06 DIAGNOSIS — S199XXA Unspecified injury of neck, initial encounter: Secondary | ICD-10-CM | POA: Diagnosis not present

## 2024-01-06 DIAGNOSIS — W19XXXA Unspecified fall, initial encounter: Secondary | ICD-10-CM | POA: Diagnosis not present

## 2024-01-06 DIAGNOSIS — J9601 Acute respiratory failure with hypoxia: Secondary | ICD-10-CM | POA: Diagnosis not present

## 2024-01-06 DIAGNOSIS — S32030A Wedge compression fracture of third lumbar vertebra, initial encounter for closed fracture: Secondary | ICD-10-CM | POA: Diagnosis not present

## 2024-01-06 DIAGNOSIS — W1800XA Striking against unspecified object with subsequent fall, initial encounter: Secondary | ICD-10-CM | POA: Diagnosis not present

## 2024-01-06 DIAGNOSIS — I10 Essential (primary) hypertension: Secondary | ICD-10-CM | POA: Diagnosis not present

## 2024-01-06 DIAGNOSIS — M549 Dorsalgia, unspecified: Secondary | ICD-10-CM | POA: Diagnosis not present

## 2024-01-06 DIAGNOSIS — W0110XA Fall on same level from slipping, tripping and stumbling with subsequent striking against unspecified object, initial encounter: Secondary | ICD-10-CM | POA: Diagnosis not present

## 2024-01-06 DIAGNOSIS — S22030A Wedge compression fracture of third thoracic vertebra, initial encounter for closed fracture: Secondary | ICD-10-CM | POA: Diagnosis not present

## 2024-01-06 DIAGNOSIS — I480 Paroxysmal atrial fibrillation: Secondary | ICD-10-CM | POA: Diagnosis not present

## 2024-01-06 DIAGNOSIS — S32038A Other fracture of third lumbar vertebra, initial encounter for closed fracture: Secondary | ICD-10-CM | POA: Diagnosis not present

## 2024-01-06 DIAGNOSIS — Z7409 Other reduced mobility: Secondary | ICD-10-CM | POA: Diagnosis not present

## 2024-01-06 DIAGNOSIS — S0990XA Unspecified injury of head, initial encounter: Secondary | ICD-10-CM | POA: Diagnosis not present

## 2024-01-06 DIAGNOSIS — R011 Cardiac murmur, unspecified: Secondary | ICD-10-CM | POA: Diagnosis not present

## 2024-01-06 DIAGNOSIS — R791 Abnormal coagulation profile: Secondary | ICD-10-CM | POA: Diagnosis not present

## 2024-01-06 DIAGNOSIS — M810 Age-related osteoporosis without current pathological fracture: Secondary | ICD-10-CM | POA: Diagnosis not present

## 2024-01-06 DIAGNOSIS — Z7901 Long term (current) use of anticoagulants: Secondary | ICD-10-CM | POA: Diagnosis not present

## 2024-01-06 DIAGNOSIS — K2100 Gastro-esophageal reflux disease with esophagitis, without bleeding: Secondary | ICD-10-CM | POA: Diagnosis not present

## 2024-01-06 DIAGNOSIS — M8088XA Other osteoporosis with current pathological fracture, vertebra(e), initial encounter for fracture: Secondary | ICD-10-CM | POA: Diagnosis not present

## 2024-01-06 DIAGNOSIS — R296 Repeated falls: Secondary | ICD-10-CM | POA: Diagnosis not present

## 2024-01-06 DIAGNOSIS — K219 Gastro-esophageal reflux disease without esophagitis: Secondary | ICD-10-CM | POA: Diagnosis not present

## 2024-01-06 DIAGNOSIS — F411 Generalized anxiety disorder: Secondary | ICD-10-CM | POA: Diagnosis not present

## 2024-01-07 DIAGNOSIS — S32030A Wedge compression fracture of third lumbar vertebra, initial encounter for closed fracture: Secondary | ICD-10-CM | POA: Diagnosis not present

## 2024-01-08 DIAGNOSIS — S32030A Wedge compression fracture of third lumbar vertebra, initial encounter for closed fracture: Secondary | ICD-10-CM | POA: Diagnosis not present

## 2024-01-08 DIAGNOSIS — I083 Combined rheumatic disorders of mitral, aortic and tricuspid valves: Secondary | ICD-10-CM | POA: Diagnosis not present

## 2024-01-09 DIAGNOSIS — S32030A Wedge compression fracture of third lumbar vertebra, initial encounter for closed fracture: Secondary | ICD-10-CM | POA: Diagnosis not present

## 2024-01-10 ENCOUNTER — Ambulatory Visit: Payer: Self-pay

## 2024-01-10 ENCOUNTER — Other Ambulatory Visit: Payer: Self-pay | Admitting: Family Medicine

## 2024-01-10 DIAGNOSIS — J90 Pleural effusion, not elsewhere classified: Secondary | ICD-10-CM | POA: Diagnosis not present

## 2024-01-10 DIAGNOSIS — J9811 Atelectasis: Secondary | ICD-10-CM | POA: Diagnosis not present

## 2024-01-10 DIAGNOSIS — J9601 Acute respiratory failure with hypoxia: Secondary | ICD-10-CM | POA: Diagnosis not present

## 2024-01-10 DIAGNOSIS — I10 Essential (primary) hypertension: Secondary | ICD-10-CM | POA: Diagnosis not present

## 2024-01-10 DIAGNOSIS — M48061 Spinal stenosis, lumbar region without neurogenic claudication: Secondary | ICD-10-CM | POA: Diagnosis not present

## 2024-01-10 DIAGNOSIS — M8008XD Age-related osteoporosis with current pathological fracture, vertebra(e), subsequent encounter for fracture with routine healing: Secondary | ICD-10-CM | POA: Diagnosis not present

## 2024-01-10 DIAGNOSIS — K589 Irritable bowel syndrome without diarrhea: Secondary | ICD-10-CM | POA: Diagnosis not present

## 2024-01-10 DIAGNOSIS — F411 Generalized anxiety disorder: Secondary | ICD-10-CM | POA: Diagnosis not present

## 2024-01-10 DIAGNOSIS — I480 Paroxysmal atrial fibrillation: Secondary | ICD-10-CM | POA: Diagnosis not present

## 2024-01-10 MED ORDER — GABAPENTIN 100 MG PO CAPS: ORAL_CAPSULE | ORAL | 0 refills | Status: DC

## 2024-01-10 NOTE — Telephone Encounter (Signed)
 Pls call daughter Barnie and let her know needs to stop the Celebrex .  We may also need to let the home health agency know that as well it definitely interacts with her blood thinner the apixaban .  She will need to rely on Tylenol  for pain control and if she needs something different then let me know.  It looks like she might be needing a refill on Zofran  but I was not sure from the note below but I am happy to send that over to the pharmacy for her if she would like.

## 2024-01-10 NOTE — Telephone Encounter (Signed)
 I called and spoke to her son and he states the Home Health and the provider at the hospital wanted her to be on Celebrex . I did advise him Dr Alvan doesn't want her to take the Celebrex . He states it is a new Celebrex .   She did receive a 7 day supply of Hydrocodone  and lidocaine  patches. They may or may not need the Zofran . He believes the hospital is calling in some Zofran .

## 2024-01-10 NOTE — Telephone Encounter (Signed)
 Thank you for clarification.   Sent over gabapentin.   I I put it as a taper because gabapentin can be sedating she can start with 1 capsule 3 times a day and then after couple of days if she is tolerating it well but needs more pain control then she can go up to 2 capsules, 3 times a day.  And then gradually work up to 3 capsules 3 times a day.  If it is too sedating then stay at a lower dose.  Or she can even just take twice a day instead of 3 times a day.  I do not want her sleeping excessively but I do want her to get some pain relief and then after a week or so we can see what dose she is actually using and then I can always adjust the prescription as it does come in a 300 mg capsule if she is able to tolerate that dose.

## 2024-01-10 NOTE — Telephone Encounter (Signed)
 Tonya [name], Jalayiah 's daughter, states they did receive the Zofran . She is ok with her mom not taking the Celebrex . She really doesn't want her taking the Hydrocodone . They did give her gabapentin while in the hospital. She is requesting a prescription for gabapentin.

## 2024-01-10 NOTE — Telephone Encounter (Signed)
 Question concerning medication interaction:  Caller/Agency: Eveline Hedda Corean Salome    Callback Number: [phone] Vm is confidential may leave Vm    Service Requested: Physical Therapy    Frequency: 1 week 1 2 week 1 1 week 2 0 week 1 1 week 4    Any new concerns about the patient? Yes, requesting home health aid to assist personal care. There is a level 2 drug interaction between apixaban  (ELIQUIS ) and Celbrex.

## 2024-01-10 NOTE — Telephone Encounter (Signed)
 Called daughter back to answer question regarding virtual urgent care/video visit: not able to schedule pt due to no email and no mychart

## 2024-01-10 NOTE — Telephone Encounter (Signed)
 Patient daughter Adrien informed.

## 2024-01-10 NOTE — Telephone Encounter (Signed)
 FYI Only or Action Required?: Action required by provider: requesting nausea medication.  Patient was last seen in primary care on 03/26/2023 by Alvan Dorothyann BIRCH, MD.  Called Nurse Triage reporting Nausea.  Symptoms began 01/06/2024.  Interventions attempted: Prescription medications: Zofran  while in hospital.  Symptoms are: gradually worsening.  Triage Disposition: Home Care  Patient/caregiver understands and will follow disposition?: Yes    Copied from CRM #[id number]. Topic: Clinical - Red Word Triage >> Jan 10, 2024 11:21 AM Cherylann RAMAN wrote: Red Word that prompted transfer to Nurse Triage: Barnie, Daughter, called and stated that patient fell on Monday 07/21 and was taken to the hospital and admitted. Patient was discharged on 07/24 with a broken or  compression fractured L3. Mom has been placed on a lot of medication that is causing patient's nausea. While in the hospital patient was given an injection that helped with the nausea. Patient has been placed in PT that began 07/25. Reason for Disposition  Unexplained nausea  Answer Assessment - Initial Assessment Questions 1. NAUSEA SEVERITY: How bad is the nausea? (e.g., mild, moderate, severe; dehydration, weight loss)     severe 2. ONSET: When did the nausea begin?     01/09/2024 3. VOMITING: Any vomiting? If Yes, ask: How many times today?     no 4. RECURRENT SYMPTOM: Have you had nausea before? If Yes, ask: When was the last time? What happened that time?     Ongoing since 01/06/2024 5. CAUSE: What do you think is causing the nausea?     Unknown 6. PREGNANCY: Is there any chance you are pregnant? (e.g., unprotected intercourse, missed birth control pill, broken condom)     Na  Pt's daughter requesting nausea medication for Mom.  Pt stated while in the hospital her Mom was getting nausea medication however now that they are home she is still having nausea and wanted to see if PCP can prescribe  something.  Protocols used: Nausea-A-AH

## 2024-01-11 ENCOUNTER — Other Ambulatory Visit: Payer: Self-pay | Admitting: Family Medicine

## 2024-01-11 DIAGNOSIS — I10 Essential (primary) hypertension: Secondary | ICD-10-CM

## 2024-01-13 DIAGNOSIS — M8008XD Age-related osteoporosis with current pathological fracture, vertebra(e), subsequent encounter for fracture with routine healing: Secondary | ICD-10-CM | POA: Diagnosis not present

## 2024-01-13 DIAGNOSIS — F411 Generalized anxiety disorder: Secondary | ICD-10-CM | POA: Diagnosis not present

## 2024-01-13 DIAGNOSIS — J9811 Atelectasis: Secondary | ICD-10-CM | POA: Diagnosis not present

## 2024-01-13 DIAGNOSIS — J90 Pleural effusion, not elsewhere classified: Secondary | ICD-10-CM | POA: Diagnosis not present

## 2024-01-13 DIAGNOSIS — M48061 Spinal stenosis, lumbar region without neurogenic claudication: Secondary | ICD-10-CM | POA: Diagnosis not present

## 2024-01-13 DIAGNOSIS — J9601 Acute respiratory failure with hypoxia: Secondary | ICD-10-CM | POA: Diagnosis not present

## 2024-01-13 DIAGNOSIS — I480 Paroxysmal atrial fibrillation: Secondary | ICD-10-CM | POA: Diagnosis not present

## 2024-01-13 DIAGNOSIS — I10 Essential (primary) hypertension: Secondary | ICD-10-CM | POA: Diagnosis not present

## 2024-01-13 DIAGNOSIS — K589 Irritable bowel syndrome without diarrhea: Secondary | ICD-10-CM | POA: Diagnosis not present

## 2024-01-14 ENCOUNTER — Telehealth: Payer: Self-pay

## 2024-01-14 DIAGNOSIS — J9601 Acute respiratory failure with hypoxia: Secondary | ICD-10-CM | POA: Diagnosis not present

## 2024-01-14 DIAGNOSIS — J90 Pleural effusion, not elsewhere classified: Secondary | ICD-10-CM | POA: Diagnosis not present

## 2024-01-14 DIAGNOSIS — M8008XD Age-related osteoporosis with current pathological fracture, vertebra(e), subsequent encounter for fracture with routine healing: Secondary | ICD-10-CM | POA: Diagnosis not present

## 2024-01-14 DIAGNOSIS — M48061 Spinal stenosis, lumbar region without neurogenic claudication: Secondary | ICD-10-CM | POA: Diagnosis not present

## 2024-01-14 DIAGNOSIS — I480 Paroxysmal atrial fibrillation: Secondary | ICD-10-CM | POA: Diagnosis not present

## 2024-01-14 DIAGNOSIS — F411 Generalized anxiety disorder: Secondary | ICD-10-CM | POA: Diagnosis not present

## 2024-01-14 DIAGNOSIS — K589 Irritable bowel syndrome without diarrhea: Secondary | ICD-10-CM | POA: Diagnosis not present

## 2024-01-14 DIAGNOSIS — J9811 Atelectasis: Secondary | ICD-10-CM | POA: Diagnosis not present

## 2024-01-14 DIAGNOSIS — I10 Essential (primary) hypertension: Secondary | ICD-10-CM | POA: Diagnosis not present

## 2024-01-14 NOTE — Telephone Encounter (Signed)
 Okay for PT and home health aide as recommended below.  He should not be taking Celebrex  I do not even see it on her med list so I am not sure if this was prescribed by an outside provider or if it is old but they are correct she should be using Tylenol  for pain since she is on a blood thinner.

## 2024-01-14 NOTE — Telephone Encounter (Signed)
 Copied from CRM [id number]. Topic: Clinical - Home Health Verbal Orders >> Jan 10, 2024 11:57 AM Cherylann RAMAN wrote: Caller/Agency: [facility] Callback Number: [phone] Vm is confidential may leave Vm Service Requested: Physical Therapy Frequency: 1 week 1 2 week 1 1 week 2 0 week 1 1 week 4 Any new concerns about the patient? Yes, requesting home health aid to assist personal care. There is a level 2 drug interaction between apixaban  (ELIQUIS ) and Celbrex. >> Jan 14, 2024 12:27 PM Carrielelia G wrote: E[facility]ine with [facility] calling back to inquire about the request for a Home Health Aid  Please advise

## 2024-01-15 NOTE — Telephone Encounter (Signed)
 The gabapentin  is safe

## 2024-01-15 NOTE — Telephone Encounter (Signed)
 Spoke with arnold from [facility] and he states the pt was given celebrex  in the hospital as well as gabapentin . She did not take because Dr. Twyla advised her not to. She is confused about what is right pls advise thanks Annabella Rigg, CMA

## 2024-01-15 NOTE — Telephone Encounter (Signed)
 Spoke with the pts daughter she is aware and pt has not being taken the celebrex .

## 2024-01-16 DIAGNOSIS — J9811 Atelectasis: Secondary | ICD-10-CM | POA: Diagnosis not present

## 2024-01-16 DIAGNOSIS — M8008XD Age-related osteoporosis with current pathological fracture, vertebra(e), subsequent encounter for fracture with routine healing: Secondary | ICD-10-CM | POA: Diagnosis not present

## 2024-01-16 DIAGNOSIS — K589 Irritable bowel syndrome without diarrhea: Secondary | ICD-10-CM | POA: Diagnosis not present

## 2024-01-16 DIAGNOSIS — M48061 Spinal stenosis, lumbar region without neurogenic claudication: Secondary | ICD-10-CM | POA: Diagnosis not present

## 2024-01-16 DIAGNOSIS — F411 Generalized anxiety disorder: Secondary | ICD-10-CM | POA: Diagnosis not present

## 2024-01-16 DIAGNOSIS — J9601 Acute respiratory failure with hypoxia: Secondary | ICD-10-CM | POA: Diagnosis not present

## 2024-01-16 DIAGNOSIS — I10 Essential (primary) hypertension: Secondary | ICD-10-CM | POA: Diagnosis not present

## 2024-01-16 DIAGNOSIS — I480 Paroxysmal atrial fibrillation: Secondary | ICD-10-CM | POA: Diagnosis not present

## 2024-01-16 DIAGNOSIS — J90 Pleural effusion, not elsewhere classified: Secondary | ICD-10-CM | POA: Diagnosis not present

## 2024-01-18 ENCOUNTER — Other Ambulatory Visit: Payer: Self-pay | Admitting: Family Medicine

## 2024-01-20 ENCOUNTER — Telehealth: Payer: Self-pay

## 2024-01-20 NOTE — Telephone Encounter (Signed)
 Copied from CRM [id number]. Topic: Clinical - Home Health Verbal Orders >> Jan 17, 2024  4:57 PM Mercer PEDLAR wrote: Caller/Agency: Todd from [facility]  Callback Number: [phone] Service Requested: Occupational Therapy Frequency: 1 week 3, skip 1 week, 1 week 1 for functional mobility, safety, pain control, ulcer prevention, health promotion, and exercise.  Second service requested: Home aid 2 week 1, 1 week 3.  Any new concerns about the patient? No

## 2024-01-21 ENCOUNTER — Ambulatory Visit: Payer: Medicare HMO

## 2024-01-21 VITALS — Ht 67.0 in | Wt 160.0 lb

## 2024-01-21 DIAGNOSIS — K589 Irritable bowel syndrome without diarrhea: Secondary | ICD-10-CM | POA: Diagnosis not present

## 2024-01-21 DIAGNOSIS — Z0000 Encounter for general adult medical examination without abnormal findings: Secondary | ICD-10-CM

## 2024-01-21 DIAGNOSIS — I480 Paroxysmal atrial fibrillation: Secondary | ICD-10-CM | POA: Diagnosis not present

## 2024-01-21 DIAGNOSIS — F411 Generalized anxiety disorder: Secondary | ICD-10-CM | POA: Diagnosis not present

## 2024-01-21 DIAGNOSIS — J90 Pleural effusion, not elsewhere classified: Secondary | ICD-10-CM | POA: Diagnosis not present

## 2024-01-21 DIAGNOSIS — J9601 Acute respiratory failure with hypoxia: Secondary | ICD-10-CM | POA: Diagnosis not present

## 2024-01-21 DIAGNOSIS — M8008XD Age-related osteoporosis with current pathological fracture, vertebra(e), subsequent encounter for fracture with routine healing: Secondary | ICD-10-CM | POA: Diagnosis not present

## 2024-01-21 DIAGNOSIS — M48061 Spinal stenosis, lumbar region without neurogenic claudication: Secondary | ICD-10-CM | POA: Diagnosis not present

## 2024-01-21 DIAGNOSIS — I10 Essential (primary) hypertension: Secondary | ICD-10-CM | POA: Diagnosis not present

## 2024-01-21 DIAGNOSIS — J9811 Atelectasis: Secondary | ICD-10-CM | POA: Diagnosis not present

## 2024-01-21 NOTE — Progress Notes (Signed)
 Subjective:   Tonya [name] is a 89 y.o. female who presents for Medicare Annual (Subsequent) preventive examination.  Visit Complete: Virtual I connected with  Tonya [name] on 01/21/24 by a audio enabled telemedicine application and verified that I am speaking with the correct person using two identifiers.  Patient Location: Home  Provider Location: Office/Clinic  I discussed the limitations of evaluation and management by telemedicine. The patient expressed understanding and agreed to proceed.  Vital Signs: Because this visit was a virtual/telehealth visit, some criteria may be missing or patient reported. Any vitals not documented were not able to be obtained and vitals that have been documented are patient reported.  Patient Medicare AWV questionnaire was completed by the patient on n/a; I have confirmed that all information answered by patient is correct and no changes since this date.  Cardiac Risk Factors include: advanced age (>60men, >25 women);hypertension;sedentary lifestyle     Objective:    Today's Vitals   01/21/24 1310  Weight: 160 lb (72.6 kg)  Height: 5' 7 (1.702 m)  PainSc: 3    Body mass index is 25.06 kg/m.     01/21/2024    1:36 PM 01/15/2023    1:21 PM 09/23/2020   10:07 AM 02/16/2018    4:00 PM 02/16/2018   12:02 AM 10/31/2016    2:01 PM 11/06/2013    1:47 PM  Advanced Directives  Does Patient Have a Medical Advance Directive? Yes Yes Yes Yes  Yes  No  Patient has advance directive, copy in chart   Type of Advance Directive Healthcare Power of [place];Living will Living will Healthcare Power of [place];Living will Healthcare Power of [place];Living will Healthcare Power of [place];Living will    Does patient want to make changes to medical advance directive?  No - Patient declined No - Patient declined No - Patient declined      Copy of Healthcare Power of Attorney in Chart? No - copy requested  No - copy requested No - copy requested  No - copy  requested     Would patient like information on creating a medical advance directive?      No - Patient declined       Data saved with a previous flowsheet row definition     Current Medications (verified) Outpatient Encounter Medications as of 01/21/2024  Medication Sig   ALPRAZolam  (XANAX ) 0.25 MG tablet TAKE 1 TABLET BY MOUTH DAILY AS NEEDED FOR ANXIETY   apixaban  (ELIQUIS ) 5 MG TABS tablet TAKE 1 TABLET BY MOUTH 2 TIMES A DAY   cetirizine (ZYRTEC) 10 MG tablet Take 10 mg by mouth daily.   escitalopram  (LEXAPRO ) 10 MG tablet TAKE 1 TABLET BY MOUTH DAILY   esomeprazole  (NEXIUM ) 40 MG capsule TAKE 1 CAPSULE BY MOUTH DAILY AT NOON   gabapentin  (NEURONTIN ) 100 MG capsule Take 1 capsule (100 mg total) by mouth 3 (three) times daily for 2 days, THEN 2 capsules (200 mg total) 3 (three) times daily for 2 days, THEN 3 capsules (300 mg total) 3 (three) times daily for 28 days.   hydrochlorothiazide  (MICROZIDE ) 12.5 MG capsule TAKE 1 CAPSULE BY MOUTH DAILY   metoprolol  succinate (TOPROL -XL) 25 MG 24 hr tablet TAKE 1 TABLET BY MOUTH DAILY   terbinafine  (LAMISIL ) 250 MG tablet Take 1 tablet (250 mg total) by mouth daily. (Patient not taking: Reported on 01/21/2024)   No facility-administered encounter medications on file as of 01/21/2024.    Allergies (verified) Amlodipine , Codeine, and Tramadol   History: Past Medical History:  Diagnosis Date   Anxiety    Cystocele    Degenerative disc disease    Hypertension    IBS (irritable bowel syndrome)    Rectocele    Past Surgical History:  Procedure Laterality Date   ABDOMINAL HYSTERECTOMY     partial   CHOLECYSTECTOMY     LAPAROSCOPIC APPENDECTOMY N/A 02/16/2018   Procedure: APPENDECTOMY LAPAROSCOPIC;  Surgeon: Debby Hila, MD;  Location: [facility];  Service: General;  Laterality: N/A;   Family History  Problem Relation Age of Onset   Depression Other    GER disease Son    Depression Son    Social History   Socioeconomic History    Marital status: Widowed    Spouse name: Not on file   Number of children: 3   Years of education: 38   Highest education level: Some college, no degree  Occupational History   Occupation: Retired  Tobacco Use   Smoking status: Never   Smokeless tobacco: Never  Vaping Use   Vaping status: Never Used  Substance and Sexual Activity   Alcohol use: No   Drug use: No   Sexual activity: Not on file  Other Topics Concern   Not on file  Social History Narrative   retired, 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 She enjoys reading.    Social Drivers of Corporate investment banker Strain: Low Risk  (01/21/2024)   Overall Financial Resource Strain (CARDIA)    Difficulty of Paying Living Expenses: Not hard at all  Food Insecurity: No Food Insecurity (01/21/2024)   Hunger Vital Sign    Worried About Running Out of Food in the Last Year: Never true    Ran Out of Food in the Last Year: Never true  Transportation Needs: No Transportation Needs (01/21/2024)   PRAPARE - Administrator, Civil Service (Medical): No    Lack of Transportation (Non-Medical): No  Physical Activity: Insufficiently Active (01/21/2024)   Exercise Vital Sign    Days of Exercise per Week: 7 days    Minutes of Exercise per Session: 20 min  Stress: No Stress Concern Present (01/21/2024)   [employer] of Occupational Health - Occupational Stress Questionnaire    Feeling of Stress: Not at all  Social Connections: Socially Isolated (01/21/2024)   Social Connection and Isolation Panel    Frequency of Communication with Friends and Family: More than three times a week    Frequency of Social Gatherings with Friends and Family: More than three times a week    Attends Religious Services: Never    Database administrator or Organizations: No    Attends Banker Meetings: Never    Marital Status: Widowed    Tobacco Counseling Counseling  given: Not Answered   Clinical Intake:  Pre-visit preparation completed: Yes  Pain : 0-10 Pain Score: 3  Pain Type: Acute pain Pain Location: Back Pain Orientation: Lower Pain Relieving Factors: physical therapy Effect of Pain on Daily Activities: She has trouble walking.  Pain Relieving Factors: physical therapy  BMI - recorded: 25.06 Nutritional Status: BMI 25 -29 Overweight Nutritional Risks: None Diabetes: No  How often do you need to have someone help you when you read instructions, pamphlets, or other written materials from your doctor or pharmacy?: 1 - Never What is the last grade level you completed in school?: 14  Interpreter Needed?: No  Activities of Daily Living    01/21/2024    1:21 PM  In your present state of health, do you have any difficulty performing the following activities:  Hearing? 1  Vision? 0  Difficulty concentrating or making decisions? 0  Walking or climbing stairs? 1  Dressing or bathing? 1  Doing errands, shopping? 1  Preparing Food and eating ? Y  Comment daughter helps  Using the Toilet? N  In the past six months, have you accidently leaked urine? N  Do you have problems with loss of bowel control? N  Managing your Medications? Y  Managing your Finances? N  Housekeeping or managing your Housekeeping? Y    Patient Care Team: Alvan Dorothyann BIRCH, MD as PCP - General  Indicate any recent Medical Services you may have received from other than [facility] providers in the past year (date may be approximate).     Assessment:   This is a routine wellness examination for Tonya [name] .  Hearing/Vision screen No results found.   Goals Addressed             This Visit's Progress    Patient Stated       Patient states she would like to continue with physical therapy and get stronger.        Depression Screen    01/21/2024    1:34 PM 01/15/2023    1:22 PM 01/14/2023    2:05 PM 12/11/2021    2:33 PM 06/26/2021   11:57 AM 10/20/2020     9:51 AM 09/23/2020   10:07 AM  PHQ 2/9 Scores  PHQ - 2 Score 0 3 3 0 0 0 0  PHQ- 9 Score  9 9        Fall Risk    01/21/2024    1:36 PM 01/15/2023    1:21 PM 01/14/2023   11:57 AM 12/11/2021    2:32 PM 06/26/2021   11:57 AM  Fall Risk   Falls in the past year? 1 0 0 0 0  Number falls in past yr: 1 0 0 0 0  Injury with Fall? 1 0 0 0 0  Risk for fall due to : Impaired mobility Impaired mobility No Fall Risks Impaired balance/gait;Impaired mobility No Fall Risks  Follow up Education provided Falls evaluation completed Falls evaluation completed Falls prevention discussed;Falls evaluation completed  Falls prevention discussed;Falls evaluation completed      Data saved with a previous flowsheet row definition     MEDICARE RISK AT HOME: Medicare Risk at Home Any stairs in or around the home?: Yes If so, are there any without handrails?: Yes Home free of loose throw rugs in walkways, pet beds, electrical cords, etc?: Yes Adequate lighting in your home to reduce risk of falls?: Yes Life alert?: No Use of a cane, walker or w/c?: Yes Grab bars in the bathroom?: Yes Shower chair or bench in shower?: Yes Elevated toilet seat or a handicapped toilet?: Yes  TIMED UP AND GO:  Was the test performed?  No    Cognitive Function:        01/21/2024    1:38 PM 01/15/2023    1:30 PM 09/23/2020   10:16 AM  6CIT Screen  What Year? 0 points 0 points 0 points  What month? 0 points 0 points 0 points  What time? 0 points 0 points 0 points  Count back from 20 0 points 0 points 0 points  Months in reverse 0 points 0 points 0 points  Repeat  phrase 2 points 2 points 0 points  Total Score 2 points 2 points 0 points    Immunizations Immunization History  Administered Date(s) Administered   Pneumococcal Conjugate-13 02/23/2014   Pneumococcal Polysaccharide-23 08/20/2017   Tdap 06/21/2011    TDAP status: Due, Education has been provided regarding the importance of this vaccine. Advised may  receive this vaccine at local pharmacy or Health Dept. Aware to provide a copy of the vaccination record if obtained from local pharmacy or Health Dept. Verbalized acceptance and understanding.  Flu Vaccine status: Declined, Education has been provided regarding the importance of this vaccine but patient still declined. Advised may receive this vaccine at local pharmacy or Health Dept. Aware to provide a copy of the vaccination record if obtained from local pharmacy or Health Dept. Verbalized acceptance and understanding.  Pneumococcal vaccine status: Up to date  Covid-19 vaccine status: Declined, Education has been provided regarding the importance of this vaccine but patient still declined. Advised may receive this vaccine at local pharmacy or Health Dept.or vaccine clinic. Aware to provide a copy of the vaccination record if obtained from local pharmacy or Health Dept. Verbalized acceptance and understanding.  Qualifies for Shingles Vaccine? Yes   Zostavax completed No   Shingrix Completed?: No.    Education has been provided regarding the importance of this vaccine. Patient has been advised to call insurance company to determine out of pocket expense if they have not yet received this vaccine. Advised may also receive vaccine at local pharmacy or Health Dept. Verbalized acceptance and understanding.  Screening Tests Health Maintenance  Topic Date Due   Zoster Vaccines- Shingrix (1 of 2) Never done   DTaP/Tdap/Td (2 - Td or Tdap) 06/20/2021   COVID-19 Vaccine (1 - 2024-25 season) Never done   INFLUENZA VACCINE  01/17/2024   Medicare Annual Wellness (AWV)  01/20/2025   Pneumococcal Vaccine: 50+ Years  Completed   DEXA SCAN  Completed   Hepatitis B Vaccines  Aged Out   HPV VACCINES  Aged Out   Meningococcal B Vaccine  Aged Out    Health Maintenance  Health Maintenance Due  Topic Date Due   Zoster Vaccines- Shingrix (1 of 2) Never done   DTaP/Tdap/Td (2 - Td or Tdap) 06/20/2021    COVID-19 Vaccine (1 - 2024-25 season) Never done   INFLUENZA VACCINE  01/17/2024    Colorectal cancer screening: No longer required.   Mammogram status: No longer required due to age.  Bone density   Lung Cancer Screening: (Low Dose CT Chest recommended if Age 29-80 years, 20 pack-year currently smoking OR have quit w/in 15years.) does not qualify.   Lung Cancer Screening Referral: n/a  Additional Screening:  Hepatitis C Screening: does not qualify; Completed   Vision Screening: Recommended annual ophthalmology exams for early detection of glaucoma and other disorders of the eye. Is the patient up to date with their annual eye exam?  No  Who is the provider or what is the name of the office in which the patient attends annual eye exams?  If pt is not established with a provider, would they like to be referred to a provider to establish care? No .   Dental Screening: Recommended annual dental exams for proper oral hygiene   Community Resource Referral / Chronic Care Management: CRR required this visit?  No   CCM required this visit?  No     Plan:     I have personally reviewed and noted the following in the  patient's chart:   Medical and social history Use of alcohol, tobacco or illicit drugs  Current medications and supplements including opioid prescriptions. Patient is not currently taking opioid prescriptions. Functional ability and status Nutritional status Physical activity Advanced directives List of other physicians Hospitalizations # 0, surgeries, and ER # 1 visits in previous 12 months Vitals Screenings to include cognitive, depression, and falls Referrals and appointments  In addition, I have reviewed and discussed with patient certain preventive protocols, quality metrics, and best practice recommendations. A written personalized care plan for preventive services as well as general preventive health recommendations were provided to patient.     Tonya [name], CMA   01/21/2024   After Visit Summary: (MyChart) Due to this being a telephonic visit, the after visit summary with patients personalized plan was offered to patient via MyChart   Nurse Notes:   Tonya [name] is a 89 y.o. female patient of Metheney, Dorothyann BIRCH, MD who had a Medicare Annual Wellness Visit today via telephone. Tonya [name]  is Retired and lives with their son. She has 3 children. she reports that she is socially active and does interact with friends/family regularly. She is minimally physically active and enjoys reading.

## 2024-01-21 NOTE — Patient Instructions (Signed)
 [name] , Thank you for taking time to come for your Medicare Wellness Visit. I appreciate your ongoing commitment to your health goals. Please review the following plan we discussed and let me know if I can assist you in the future.   These are the goals we discussed:  Goals       Patient Stated      09/23/2020 AWV Goal: Exercise for General Health  Patient will verbalize understanding of the benefits of increased physical activity: Exercising regularly is important. It will improve your overall fitness, flexibility, and endurance. Regular exercise also will improve your overall health. It can help you control your weight, reduce stress, and improve your bone density. Over the next year, patient will increase physical activity as tolerated with a goal of at least 150 minutes of moderate physical activity per week.  You can tell that you are exercising at a moderate intensity if your heart starts beating faster and you start breathing faster but can still hold a conversation. Moderate-intensity exercise ideas include: Walking 1 mile (1.6 km) in about 15 minutes Biking Hiking Golfing Dancing Water aerobics Patient will verbalize understanding of everyday activities that increase physical activity by providing examples like the following: Yard work, such as: Insurance underwriter Gardening Washing windows or floors Patient will be able to explain general safety guidelines for exercising:  Before you start a new exercise program, talk with your health care provider. Do not exercise so much that you hurt yourself, feel dizzy, or get very short of breath. Wear comfortable clothes and wear shoes with good support. Drink plenty of water while you exercise to prevent dehydration or heat stroke. Work out until your breathing and your heartbeat get faster.       Patient Stated (pt-stated)      01/15/2023 AWV  Goal: Exercise for General Health  Patient will verbalize understanding of the benefits of increased physical activity: Exercising regularly is important. It will improve your overall fitness, flexibility, and endurance. Regular exercise also will improve your overall health. It can help you control your weight, reduce stress, and improve your bone density. Over the next year, patient will increase physical activity as tolerated with a goal of at least 150 minutes of moderate physical activity per week.  You can tell that you are exercising at a moderate intensity if your heart starts beating faster and you start breathing faster but can still hold a conversation. Moderate-intensity exercise ideas include: Walking 1 mile (1.6 km) in about 15 minutes Biking Hiking Golfing Dancing Water aerobics Patient will verbalize understanding of everyday activities that increase physical activity by providing examples like the following: Yard work, such as: Insurance underwriter Gardening Washing windows or floors Patient will be able to explain general safety guidelines for exercising:  Before you start a new exercise program, talk with your health care provider. Do not exercise so much that you hurt yourself, feel dizzy, or get very short of breath. Wear comfortable clothes and wear shoes with good support. Drink plenty of water while you exercise to prevent dehydration or heat stroke. Work out until your breathing and your heartbeat get faster.       Patient Stated      Patient states she would like to continue with physical therapy and get stronger.  This is a list of the screening recommended for you and due dates:  Health Maintenance  Topic Date Due   Zoster (Shingles) Vaccine (1 of 2) Never done   DTaP/Tdap/Td vaccine (2 - Td or Tdap) 06/20/2021   COVID-19 Vaccine (1 - 2024-25 season) Never done    Flu Shot  01/17/2024   Medicare Annual Wellness Visit  01/20/2025   Pneumococcal Vaccine for age over 3  Completed   DEXA scan (bone density measurement)  Completed   Hepatitis B Vaccine  Aged Out   HPV Vaccine  Aged Out   Meningitis B Vaccine  Aged Out

## 2024-01-22 ENCOUNTER — Ambulatory Visit: Payer: Self-pay | Admitting: *Deleted

## 2024-01-22 NOTE — Telephone Encounter (Signed)
Okay to approve OT.

## 2024-01-22 NOTE — Telephone Encounter (Signed)
 FYI Only or Action Required?: FYI only for provider.  Patient was last seen in primary care on 03/26/2023 by Alvan Dorothyann BIRCH, MD.  Called Nurse Triage reporting No chief complaint on file..  Symptoms began today.  Interventions attempted: Patient became more active and pulse went up  Symptoms are: none  Triage Disposition: Go to ED Now (Notify PCP)  Patient/caregiver understands and will follow disposition?: No, refuses dispositionDon Ishmael Cella PT- calling to report low pulse rate- 44- during conversation- [id number]. Spoke with patient- she is not having symptoms and declines/refuses ED at this time. Mr Rudolpho plans to call 911 and let patient officially refuse to go to hospital. Office notified of ED refusal    Reason for Disposition  [1] Heart beating very slowly (e.g., < 50 / minute) AND [2] present now  (Exception: Athlete and heart rate is normal for caller.)  Answer Assessment - Initial Assessment Questions 1. DESCRIPTION: Please describe your heart rate or heartbeat that you are having (e.g., fast/slow, regular/irregular, skipped or extra beats, palpitations)     Heart rate- apical/radial 41, 44 2. ONSET: When did it start? (e.g., minutes, hours, days)      today 3. DURATION: How long does it last (e.g., seconds, minutes, hours)     *No Answer* 4. PATTERN Does it come and go, or has it been constant since it started?  Does it get worse with exertion?   Are you feeling it now?     History of Afib- BP 110/64 5. TAP: Using your hand, can you tap out what you are feeling on a chair or table in front of you, so that I can hear? Note: Not all patients can do this.       na 6. HEART RATE: Can you tell me your heart rate? How many beats in 15 seconds?  Note: Not all patients can do this.       HR- 56 7. RECURRENT SYMPTOM: Have you ever had this before? If Yes, ask: When was the last time? and What happened that time?      *No Answer* 8.  CAUSE: What do you think is causing the palpitations?     *No Answer* 9. CARDIAC HISTORY: Do you have any history of heart disease? (e.g., heart attack, angina, bypass surgery, angioplasty, arrhythmia)      Afib 10. OTHER SYMPTOMS: Do you have any other symptoms? (e.g., dizziness, chest pain, sweating, difficulty breathing)       No symptoms reported  Protocols used: Heart Rate and Heartbeat Questions-A-AH   Copied from CRM [id number]. Topic: Clinical - Red Word Triage >> Jan 22, 2024 11:01 AM Farrel B wrote: [facility] that prompted transfer to Nurse Triage: Todd louder of [facility] OT stated the patient has a heart rate out of perimeter of what it should be, and needs assist on what to do

## 2024-01-22 NOTE — Telephone Encounter (Signed)
 Patient's heart rate was low @ 41bpm. Son was irritated and cancelled 911 call that Caller/Agency: Todd from [facility]  Callback Number: [phone]  He requested that Todd leave and not return to the home. They did not want the patient to receive Emergency care.

## 2024-01-24 DIAGNOSIS — I10 Essential (primary) hypertension: Secondary | ICD-10-CM | POA: Diagnosis not present

## 2024-01-24 DIAGNOSIS — M48061 Spinal stenosis, lumbar region without neurogenic claudication: Secondary | ICD-10-CM | POA: Diagnosis not present

## 2024-01-24 DIAGNOSIS — M8008XD Age-related osteoporosis with current pathological fracture, vertebra(e), subsequent encounter for fracture with routine healing: Secondary | ICD-10-CM | POA: Diagnosis not present

## 2024-01-24 NOTE — Addendum Note (Signed)
 Addended by: Broughton Eppinger D on: 01/24/2024 09:57 AM   Modules accepted: Orders

## 2024-01-24 NOTE — Telephone Encounter (Signed)
 Call pt: if pulse dropping that low please cut metoprolol  in half and only take half a tab daily. If pulse still dropping in low 40  after dose change pls let usknow

## 2024-01-24 NOTE — Telephone Encounter (Signed)
 Contacted the family of the patient. I was informed that the RN from [facility] did not communicate well with the family. Per the daughter, the RN's pulse ox was not working correctly. The patient has a pulse ox device and the O2 readings were ranging between 62-70 consistently. However, the readings were much lower on the RN's device. The daughter mentioned the patient's rate was all over the place, which prompted the RN to move forward with emergency care. The daughter was advised of the provider's recommendation. She verbalized understanding. Since the patient O2 is stable, she is going to have patient take her meds as usual and will make an appointment with provider as needed. The RN was asked to leave the home due to break down in the communication. The family has requested to stop all home visits from [facility].

## 2024-01-24 NOTE — Telephone Encounter (Signed)
 Pt's family states they just spoke to someone from Dr. Vernida office.

## 2024-02-03 DIAGNOSIS — M8008XD Age-related osteoporosis with current pathological fracture, vertebra(e), subsequent encounter for fracture with routine healing: Secondary | ICD-10-CM | POA: Diagnosis not present

## 2024-02-03 DIAGNOSIS — I10 Essential (primary) hypertension: Secondary | ICD-10-CM | POA: Diagnosis not present

## 2024-02-03 DIAGNOSIS — J9811 Atelectasis: Secondary | ICD-10-CM | POA: Diagnosis not present

## 2024-02-03 DIAGNOSIS — J90 Pleural effusion, not elsewhere classified: Secondary | ICD-10-CM | POA: Diagnosis not present

## 2024-02-03 DIAGNOSIS — F411 Generalized anxiety disorder: Secondary | ICD-10-CM | POA: Diagnosis not present

## 2024-02-03 DIAGNOSIS — M48061 Spinal stenosis, lumbar region without neurogenic claudication: Secondary | ICD-10-CM | POA: Diagnosis not present

## 2024-02-03 DIAGNOSIS — K589 Irritable bowel syndrome without diarrhea: Secondary | ICD-10-CM | POA: Diagnosis not present

## 2024-02-03 DIAGNOSIS — J9601 Acute respiratory failure with hypoxia: Secondary | ICD-10-CM | POA: Diagnosis not present

## 2024-02-03 DIAGNOSIS — I480 Paroxysmal atrial fibrillation: Secondary | ICD-10-CM | POA: Diagnosis not present

## 2024-02-06 ENCOUNTER — Telehealth (INDEPENDENT_AMBULATORY_CARE_PROVIDER_SITE_OTHER): Admitting: Family Medicine

## 2024-02-06 ENCOUNTER — Ambulatory Visit: Admitting: Family Medicine

## 2024-02-06 ENCOUNTER — Encounter: Payer: Self-pay | Admitting: Family Medicine

## 2024-02-06 VITALS — BP 129/80 | HR 67

## 2024-02-06 DIAGNOSIS — Z9181 History of falling: Secondary | ICD-10-CM

## 2024-02-06 DIAGNOSIS — T50905A Adverse effect of unspecified drugs, medicaments and biological substances, initial encounter: Secondary | ICD-10-CM

## 2024-02-06 DIAGNOSIS — F411 Generalized anxiety disorder: Secondary | ICD-10-CM | POA: Diagnosis not present

## 2024-02-06 DIAGNOSIS — S32030A Wedge compression fracture of third lumbar vertebra, initial encounter for closed fracture: Secondary | ICD-10-CM | POA: Diagnosis not present

## 2024-02-06 NOTE — Assessment & Plan Note (Signed)
 Ok to use 1/s tab xanax  sparingly.

## 2024-02-06 NOTE — Progress Notes (Signed)
 Pt stated that she fell 5 weeks ago. Pt stated that she is going to be traveling and wanted to know about using the cushion. She really wants to know if she can travel. She said it is her bottom that hurts her the most because she landed on it.   She stated that she has been walking since the fall with the assistance of her walker

## 2024-02-06 NOTE — Progress Notes (Addendum)
 Virtual Visit via Video Note  I connected with Tonya  A [name] on 02/19/24 at  3:00 PM EDT by a video enabled telemedicine application and verified that I am speaking with the correct person using two identifiers.   I discussed the limitations of evaluation and management by telemedicine and the availability of in person appointments. The patient expressed understanding and agreed to proceed.  Patient location: at home Provider location: in office  Subjective:    CC:  No chief complaint on file.   HPI: She was recently seen at [facility] after a fall she said she was at home and she was getting some clothing out of the dresser and turned and then fell she does not remember the EMS ride or the initial evaluation at the hospital.  She denies any head injury says she said she fell back and landed on her bottom.  But she was diagnosed with a fracture of the third lumbar vertebrae. She was given calcitonin nasal spray as well as an LSO brace for pain control.  She was admitted to the hospital for 3 days.  She was also given Norco, lidocaine  patch and Tylenol  and Celebrex  and gabapentin .  Portable chest x-ray showed small left pleural effusion with some overlying atelectasis.  Her son who was here with her for the virtual visit noted that the pain medication was overly sedating so they have just been using the gabapentin  for pain control.  Follow-up generalized anxiety disorder-she still uses a half a tab of alprazolam  2-3 times a week.   She wants to know if she can travel she and the family have rented a house at [facility] and she would be going in about a week and a half.  It is about a 3-hour drive she says overall her pain is much better she still has a little soreness in the buttock area but the pain in the low back actually is feeling better she did have her brace on during the visit today.  CT Spine Lumbar WO Contrast  Final Result  IMPRESSION:   Acute L3 superior compression  fracture with fracture lines extending through the vertebral body into the inferior endplate. There is 30% loss of vertebral body height with 7 mm of posterior superior bony retropulsion causing severe central canal stenosis.   The patient has mobility limitations that can not be resolved with a walker or cane. Unfortunately her recent injury has limited her ADLs making it more difficulty to move around the home and cook for herself.  She can safely propel transport device or have caregiver of family members available to propel patient.       Past medical history, Surgical history, Family history not pertinant except as noted below, Social history, Allergies, and medications have been entered into the medical record, reviewed, and corrections made.    Objective:    General: Speaking clearly in complete sentences without any shortness of breath.  Alert and oriented x3.  Normal judgment. No apparent acute distress.    Impression and Recommendations:    Problem List Items Addressed This Visit       Other   ANXIETY DISORDER, GENERALIZED   Ok to use 1/s tab xanax  sparingly.       Other Visit Diagnoses       Closed compression fracture of L3 lumbar vertebra, initial encounter (HCC)    -  Primary     Medication side effect, initial encounter  History of recent fall           Did discuss that she is okay to travel as long as she feels like her pain is well-controlled and as long as she is able to stop frequently every 45 minutes to an hour to get out walk and stretch.  And if she feels steady on her feet.  We did discuss that pain may vary day by day so she may have to make that decision a little closer.  Also she reports that the muscle relaxer that she was given which was Robaxin caused her to feel weak and off balance so I did add that to her intolerance list.  Mobility issues - will see if we can get a transport chair for her.   No orders of the defined types were placed  in this encounter.   No orders of the defined types were placed in this encounter.    I discussed the assessment and treatment plan with the patient. The patient was provided an opportunity to ask questions and all were answered. The patient agreed with the plan and demonstrated an understanding of the instructions.   The patient was advised to call back or seek an in-person evaluation if the symptoms worsen or if the condition fails to improve as anticipated.   Dorothyann Byars, MD

## 2024-02-10 ENCOUNTER — Other Ambulatory Visit: Payer: Self-pay | Admitting: Family Medicine

## 2024-02-10 DIAGNOSIS — J9811 Atelectasis: Secondary | ICD-10-CM | POA: Diagnosis not present

## 2024-02-10 DIAGNOSIS — F411 Generalized anxiety disorder: Secondary | ICD-10-CM | POA: Diagnosis not present

## 2024-02-10 DIAGNOSIS — M8008XD Age-related osteoporosis with current pathological fracture, vertebra(e), subsequent encounter for fracture with routine healing: Secondary | ICD-10-CM | POA: Diagnosis not present

## 2024-02-10 DIAGNOSIS — I480 Paroxysmal atrial fibrillation: Secondary | ICD-10-CM | POA: Diagnosis not present

## 2024-02-10 DIAGNOSIS — I10 Essential (primary) hypertension: Secondary | ICD-10-CM | POA: Diagnosis not present

## 2024-02-10 DIAGNOSIS — J90 Pleural effusion, not elsewhere classified: Secondary | ICD-10-CM | POA: Diagnosis not present

## 2024-02-10 DIAGNOSIS — J9601 Acute respiratory failure with hypoxia: Secondary | ICD-10-CM | POA: Diagnosis not present

## 2024-02-10 DIAGNOSIS — K589 Irritable bowel syndrome without diarrhea: Secondary | ICD-10-CM | POA: Diagnosis not present

## 2024-02-10 DIAGNOSIS — M48061 Spinal stenosis, lumbar region without neurogenic claudication: Secondary | ICD-10-CM | POA: Diagnosis not present

## 2024-02-10 NOTE — Telephone Encounter (Unsigned)
 Copied from CRM #[id number]. Topic: Clinical - Medication Refill >> Feb 10, 2024  1:41 PM Laurier C wrote: Medication: gabapentin  (NEURONTIN ) 100 MG capsule  Has the patient contacted their pharmacy? No  This is the patient's preferred pharmacy:  [facility] [id number] - [place] - [address] [place] [address] Phone: [phone] Fax: [phone]  Is this the correct pharmacy for this prescription? Yes If no, delete pharmacy and type the correct one.   Has the prescription been filled recently? No  Is the patient out of the medication? Yes  Has the patient been seen for an appointment in the last year OR does the patient have an upcoming appointment? Yes  Can we respond through MyChart? No  Agent: Please be advised that Rx refills may take up to 3 business days. We ask that you follow-up with your pharmacy.

## 2024-02-11 ENCOUNTER — Other Ambulatory Visit: Payer: Self-pay

## 2024-02-11 ENCOUNTER — Ambulatory Visit: Payer: Self-pay

## 2024-02-11 ENCOUNTER — Telehealth: Payer: Self-pay

## 2024-02-11 DIAGNOSIS — R29898 Other symptoms and signs involving the musculoskeletal system: Secondary | ICD-10-CM

## 2024-02-11 DIAGNOSIS — S32030A Wedge compression fracture of third lumbar vertebra, initial encounter for closed fracture: Secondary | ICD-10-CM

## 2024-02-11 MED ORDER — GABAPENTIN 100 MG PO CAPS: 100.0000 mg | ORAL_CAPSULE | Freq: Three times a day (TID) | ORAL | 1 refills | Status: AC

## 2024-02-11 NOTE — Telephone Encounter (Signed)
 Copied from CRM [id number]. Topic: Clinical - Order For Equipment >> Feb 11, 2024  3:09 PM Mercer PEDLAR wrote: Reason for CRM: Daughter, Barnie Simpers calling to request order for a transport chair which was recommended by physical therapist.   Callback: [phone]

## 2024-02-11 NOTE — Telephone Encounter (Signed)
 Last Filled 01/10/2024  Did you want her to continue taking this medication?

## 2024-02-11 NOTE — Telephone Encounter (Signed)
 FYI Only or Action Required?: Action required by provider: medication refill request.  Patient was last seen in primary care on 02/06/2024 by Alvan Dorothyann BIRCH, MD.  Called Nurse Triage reporting No chief complaint on file..  Symptoms began yesterday.  Interventions attempted: Nothing.  Symptoms are: stable.  Triage Disposition: Call PCP When Office is Open  Patient/caregiver understands and will follow disposition?: YesCopied from CRM #[id number]. Topic: Clinical - Red Word Triage >> Feb 11, 2024  9:43 AM Alfonso ORN wrote: Red Word that prompted transfer to Nurse Triage: ,result from a  fall , pt. has been treated   have constant throbbing pain rate 5 , patient is waiting on her medication refill for the gabapentin  (NEURONTIN ) 100 MG capsule   Pt. Call back # [phone] Reason for Disposition  [1] Caller requesting NON-URGENT health information AND [2] PCP's office is the best resource  Answer Assessment - Initial Assessment Questions 1. REASON FOR CALL: What is the main reason for your call? or How can I best help you?    Pt is waiting to hear back on gabapentin  refill request. Pain is better but somewhat there and we are going to beach I want to take that so I can enjoy my vacation. Just my bottom hurts. Fx area doesn't hurt anymore. Please advise pt if mediation can be called in.  Protocols used: Information Only Call - No Triage-A-AH

## 2024-02-11 NOTE — Telephone Encounter (Signed)
 Requesting rx rf of  Gabapentin  100mg  - last written as 16 day supply 01/10/2024 Last OV 02/06/2024  telemedicine visit Upcoming appt 01/21/2025 AWV  Tonya Dorothyann BIRCH, MD    02/11/24 10:03 AM Note See what dose she is actually taking so I can change sig on rx.      Spoke with  patient states that currently taking one capsule ( 100mg  )  three times daily and states this does work well for her.  FYI- Going 03/15/24  for knee injections with Dr. Ivie

## 2024-02-11 NOTE — Telephone Encounter (Signed)
 Medication was refilled today - patient was aware to check with pharmacy for refill today.

## 2024-02-11 NOTE — Telephone Encounter (Signed)
 Meds ordered this encounter  Medications   gabapentin  (NEURONTIN ) 100 MG capsule    Sig: Take 1 capsule (100 mg total) by mouth 3 (three) times daily.    Dispense:  270 capsule    Refill:  1

## 2024-02-11 NOTE — Telephone Encounter (Signed)
 See what dose she is actually taking so I can change sig on rx.

## 2024-02-11 NOTE — Telephone Encounter (Signed)
 Copied from CRM #[id number]. Topic: Clinical - Medication Refill >> Feb 10, 2024  1:41 PM Laurier C wrote: Medication: gabapentin  (NEURONTIN ) 100 MG capsule  Has the patient contacted their pharmacy? No  This is the patient's preferred pharmacy:  [facility] [id number] - [place] - [address] [place] [address] Phone: [phone] Fax: [phone]  Is this the correct pharmacy for this prescription? Yes If no, delete pharmacy and type the correct one.   Has the prescription been filled recently? No  Is the patient out of the medication? Yes  Has the patient been seen for an appointment in the last year OR does the patient have an upcoming appointment? Yes  Can we respond through MyChart? No  Agent: Please be advised that Rx refills may take up to 3 business days. We ask that you follow-up with your pharmacy. >> Feb 11, 2024  9:41 AM Alfonso ORN wrote: Pt. Calling on status of the refill medication , patient has 1 pill left and have constant throbbing pain , do not want to run out

## 2024-02-11 NOTE — Telephone Encounter (Signed)
 Is she talking about a wheelchair?  Please clarify? Happy to provide order

## 2024-02-12 NOTE — Telephone Encounter (Signed)
 Spoke with patient daughter - she states that it is actually called a transport chair and that it is not a wheelchair. States it is smaller and lighter. States if they have an order medicare will cover this.  Tonya [name] going _ PT with [facility] recommended this - his contact # [phone]

## 2024-02-12 NOTE — Telephone Encounter (Signed)
 routing to pool not sure why this was sent back to me I do not see that the issue has been clarified as I had requested below.

## 2024-02-12 NOTE — Telephone Encounter (Signed)
 [place] to place order for transport chair and we can fax to [facility].

## 2024-02-13 DIAGNOSIS — M170 Bilateral primary osteoarthritis of knee: Secondary | ICD-10-CM | POA: Diagnosis not present

## 2024-02-13 MED ORDER — TRANSPORT CHAIR MISC: 1.0000 | 0 refills | Status: AC | PRN

## 2024-02-13 NOTE — Telephone Encounter (Signed)
 Order sent for transport chair to [employer] [place] , [place]  Representative states could take up to a week Patient daughter will call company to see if they can expedite this process.

## 2024-02-17 DIAGNOSIS — J9601 Acute respiratory failure with hypoxia: Secondary | ICD-10-CM | POA: Diagnosis not present

## 2024-02-17 DIAGNOSIS — K589 Irritable bowel syndrome without diarrhea: Secondary | ICD-10-CM | POA: Diagnosis not present

## 2024-02-17 DIAGNOSIS — M8008XD Age-related osteoporosis with current pathological fracture, vertebra(e), subsequent encounter for fracture with routine healing: Secondary | ICD-10-CM | POA: Diagnosis not present

## 2024-02-17 DIAGNOSIS — I10 Essential (primary) hypertension: Secondary | ICD-10-CM | POA: Diagnosis not present

## 2024-02-17 DIAGNOSIS — I480 Paroxysmal atrial fibrillation: Secondary | ICD-10-CM | POA: Diagnosis not present

## 2024-02-17 DIAGNOSIS — F411 Generalized anxiety disorder: Secondary | ICD-10-CM | POA: Diagnosis not present

## 2024-02-17 DIAGNOSIS — J90 Pleural effusion, not elsewhere classified: Secondary | ICD-10-CM | POA: Diagnosis not present

## 2024-02-17 DIAGNOSIS — J9811 Atelectasis: Secondary | ICD-10-CM | POA: Diagnosis not present

## 2024-02-17 DIAGNOSIS — M48061 Spinal stenosis, lumbar region without neurogenic claudication: Secondary | ICD-10-CM | POA: Diagnosis not present

## 2024-02-18 ENCOUNTER — Telehealth: Payer: Self-pay

## 2024-02-18 NOTE — Telephone Encounter (Unsigned)
 Copied from CRM [id number]. Topic: Clinical - Order For Equipment >> Feb 11, 2024  3:09 PM Mercer PEDLAR wrote: Reason for CRM: Daughter, Barnie Simpers calling to request order for a transport chair which was recommended by physical therapist.   Callback: [phone] >> Feb 18, 2024  1:26 PM Corin V wrote: Patient daughter called and stated company is only missing the narratives and need those to finalize chair for patient. Please fax and fax to [employer]. Please call them to confirm fax number if not already with other paperwork already faxed. Phone: [phone]

## 2024-02-18 NOTE — Telephone Encounter (Signed)
 Copied from CRM [id number]. Topic: Clinical - Order For Equipment >> Feb 11, 2024  3:09 PM Mercer PEDLAR wrote: Reason for CRM: Daughter, Barnie Simpers calling to request order for a transport chair which was recommended by physical therapist.   Callback: [phone] >> Feb 18, 2024  1:26 PM Corin V wrote: Patient daughter called and stated company is only missing the narratives and need those to finalize chair for patient. Please fax and fax to [employer]. Please call them to confirm fax number if not already with other paperwork already faxed. Phone: [phone]

## 2024-02-18 NOTE — Telephone Encounter (Signed)
 Spoke with representative at [employer]( [employer]- [employer] supplier) . She states that they did have the narrative in the original paperwork but this had been overlooked . She will have the appropriate people review this and will reach out to us  if more information is needed.

## 2024-02-19 ENCOUNTER — Telehealth: Payer: Self-pay

## 2024-02-19 NOTE — Telephone Encounter (Signed)
 Copied from CRM #[id number]. Topic: Clinical - Order For Equipment >> Feb 19, 2024 11:13 AM Diannia H wrote: Reason for CRM: Could you please give the patient a call regarding: Daughter, Barnie Simpers calling to request order for a transport chair which was recommended by physical therapist. Patients callback number is [phone]. Patient is wanting to speak with Luke.  She said the lady who is helping them with this stated that she was faxing over a paper and she needs it sent back on progress notes.

## 2024-02-19 NOTE — Telephone Encounter (Signed)
 Copied from CRM #[id number]. Topic: General - Other >> Feb 19, 2024  1:03 PM Shamecia H wrote: Reason for CRM: Patient was returning a call for Kim in clinic, contacted the clinic and spoke with Livia and she stated she would send Luke a message to call the patient once she gets back from lunch. >> Feb 19, 2024  2:46 PM Miquel SAILOR wrote: Patient requesting call back ASAP on 2 sentenced to be added to letter sent by fax. Request needs to be in to location by 5pm. Needs call back on update ASAP she stated [phone]

## 2024-02-19 NOTE — Telephone Encounter (Signed)
 Patient needing addendum be written in her last progress note: With 2 sentences included  1- the patient  has mobility limitations that can not be resolved with a walker or cane 2-  patient can safely propel transport device or has caregiver of family members available to propel patient.   Patient requesting this be done ASAP as she is leaving tomorrow for the beach with family and will need this to take with her  as they do not have an elevator at the motel.

## 2024-02-19 NOTE — Telephone Encounter (Signed)
Addressed in note

## 2024-02-19 NOTE — Telephone Encounter (Signed)
 Patient informed that we faxed the notes to [employer] .

## 2024-02-24 DIAGNOSIS — M48061 Spinal stenosis, lumbar region without neurogenic claudication: Secondary | ICD-10-CM | POA: Diagnosis not present

## 2024-02-24 DIAGNOSIS — S32030A Wedge compression fracture of third lumbar vertebra, initial encounter for closed fracture: Secondary | ICD-10-CM | POA: Diagnosis not present

## 2024-02-24 DIAGNOSIS — M5416 Radiculopathy, lumbar region: Secondary | ICD-10-CM | POA: Diagnosis not present

## 2024-02-26 DIAGNOSIS — J9601 Acute respiratory failure with hypoxia: Secondary | ICD-10-CM | POA: Diagnosis not present

## 2024-02-26 DIAGNOSIS — I480 Paroxysmal atrial fibrillation: Secondary | ICD-10-CM | POA: Diagnosis not present

## 2024-02-26 DIAGNOSIS — F411 Generalized anxiety disorder: Secondary | ICD-10-CM | POA: Diagnosis not present

## 2024-02-26 DIAGNOSIS — I10 Essential (primary) hypertension: Secondary | ICD-10-CM | POA: Diagnosis not present

## 2024-02-26 DIAGNOSIS — J90 Pleural effusion, not elsewhere classified: Secondary | ICD-10-CM | POA: Diagnosis not present

## 2024-02-26 DIAGNOSIS — J9811 Atelectasis: Secondary | ICD-10-CM | POA: Diagnosis not present

## 2024-02-26 DIAGNOSIS — K589 Irritable bowel syndrome without diarrhea: Secondary | ICD-10-CM | POA: Diagnosis not present

## 2024-02-26 DIAGNOSIS — M8008XD Age-related osteoporosis with current pathological fracture, vertebra(e), subsequent encounter for fracture with routine healing: Secondary | ICD-10-CM | POA: Diagnosis not present

## 2024-02-26 DIAGNOSIS — M48061 Spinal stenosis, lumbar region without neurogenic claudication: Secondary | ICD-10-CM | POA: Diagnosis not present

## 2024-03-03 DIAGNOSIS — J9811 Atelectasis: Secondary | ICD-10-CM | POA: Diagnosis not present

## 2024-03-03 DIAGNOSIS — F411 Generalized anxiety disorder: Secondary | ICD-10-CM | POA: Diagnosis not present

## 2024-03-03 DIAGNOSIS — M48061 Spinal stenosis, lumbar region without neurogenic claudication: Secondary | ICD-10-CM | POA: Diagnosis not present

## 2024-03-03 DIAGNOSIS — I480 Paroxysmal atrial fibrillation: Secondary | ICD-10-CM | POA: Diagnosis not present

## 2024-03-03 DIAGNOSIS — M8008XD Age-related osteoporosis with current pathological fracture, vertebra(e), subsequent encounter for fracture with routine healing: Secondary | ICD-10-CM | POA: Diagnosis not present

## 2024-03-03 DIAGNOSIS — J9601 Acute respiratory failure with hypoxia: Secondary | ICD-10-CM | POA: Diagnosis not present

## 2024-03-03 DIAGNOSIS — K589 Irritable bowel syndrome without diarrhea: Secondary | ICD-10-CM | POA: Diagnosis not present

## 2024-03-03 DIAGNOSIS — I10 Essential (primary) hypertension: Secondary | ICD-10-CM | POA: Diagnosis not present

## 2024-03-03 DIAGNOSIS — J90 Pleural effusion, not elsewhere classified: Secondary | ICD-10-CM | POA: Diagnosis not present

## 2024-03-06 ENCOUNTER — Other Ambulatory Visit: Payer: Self-pay

## 2024-03-06 MED ORDER — LIDOCAINE 5 % EX PTCH: 1.0000 | MEDICATED_PATCH | CUTANEOUS | 3 refills | Status: AC

## 2024-03-06 NOTE — Telephone Encounter (Signed)
 Tonya [name] pharmacy is requesting a med refill for Licocaine patch 5%. Rx not listed in active med list. Per patient, the rx was written by ER provider.  Patient states the current directions are: 12 hours on, 12 hours off before applying a new patch. Rx pended for review.

## 2024-03-14 DIAGNOSIS — M5416 Radiculopathy, lumbar region: Secondary | ICD-10-CM | POA: Diagnosis not present

## 2024-03-14 DIAGNOSIS — M48061 Spinal stenosis, lumbar region without neurogenic claudication: Secondary | ICD-10-CM | POA: Diagnosis not present

## 2024-03-14 DIAGNOSIS — S32031A Stable burst fracture of third lumbar vertebra, initial encounter for closed fracture: Secondary | ICD-10-CM | POA: Diagnosis not present

## 2024-03-25 ENCOUNTER — Other Ambulatory Visit: Payer: Self-pay | Admitting: Family Medicine

## 2024-04-22 ENCOUNTER — Other Ambulatory Visit: Payer: Self-pay | Admitting: Family Medicine

## 2024-05-04 DIAGNOSIS — M170 Bilateral primary osteoarthritis of knee: Secondary | ICD-10-CM | POA: Diagnosis not present

## 2024-05-06 ENCOUNTER — Ambulatory Visit: Payer: Self-pay

## 2024-05-06 DIAGNOSIS — I480 Paroxysmal atrial fibrillation: Secondary | ICD-10-CM

## 2024-05-06 MED ORDER — APIXABAN 2.5 MG PO TABS: 2.5000 mg | ORAL_TABLET | Freq: Two times a day (BID) | ORAL | 1 refills | Status: DC

## 2024-05-06 NOTE — Telephone Encounter (Signed)
 The Eliquis  really is meant to be a twice a day medication.  It has a very short half-life so it wears off after 12 hours.  So instead of decreasing to once a day lets decrease her dose to 2.5 mg and have her take it twice a day.  Meds ordered this encounter  Medications   apixaban  (ELIQUIS ) 2.5 MG TABS tablet    Sig: Take 1 tablet (2.5 mg total) by mouth 2 (two) times daily.    Dispense:  180 tablet    Refill:  1   Doing the other things such as running a humidifier and keeping the nasal passages moisturized can be really helpful as well.

## 2024-05-06 NOTE — Telephone Encounter (Signed)
 Please see below, refused appt until after Dec 1  FYI Only or Action Required?: Action required by provider: clinical question for provider.  Patient was last seen in primary care on 02/06/2024 by Alvan Dorothyann BIRCH, MD.  Called Nurse Triage reporting Medication Consultation.  Symptoms began today.  Interventions attempted: Rest, hydration, or home remedies.  Symptoms are: completely resolved.  Triage Disposition: See Today In Office  Patient/caregiver understands and will follow disposition?: Refuses Reason for Disposition  [1] Caller has URGENT medicine question about med that primary care doctor (or NP/PA) or specialist prescribed AND [2] triager unable to answer question  Answer Assessment - Initial Assessment Questions Advised that pt is overdue for an appt to manage her medications per last refill request. Patient and daughter refused and state they will not be available until Dec 1 for an appt. Requesting decrease in eliquis  to once daily d/t an episode of epistaxis this morning.   States BP was 173/84 this morning during epistaxis.   While on the phone, rechecked vitals at 1214: BP 124/62 SPO2 96% P 81  Has started using humidifier, nasal spray and nasal ointment to attempt to prevent additional epistaxis.  1. NAME of MEDICINE: What medicine(s) are you calling about?     Eliquis   2. QUESTION: What is your question? (e.g., double dose of medicine, side effect)     Asking if eliquis  can be decreased to once daily  3. PRESCRIBER: Who prescribed the medicine? Reason: if prescribed by specialist, call should be referred to that group.     PCP  4. SYMPTOMS: Do you have any symptoms? If Yes, ask: What symptoms are you having?  How bad are the symptoms (e.g., mild, moderate, severe)     Epistaxis, one episode, resolved  Protocols used: Medication Question Call-A-AH Copied from CRM #[id number]. Topic: Clinical - Red Word Triage >> May 06, 2024  9:25 AM Herma G  wrote: Red Word that prompted transfer to Nurse Triage: Pt's daughter, Barnie Simpers, called to see if (ELIQUIS ) 5 MG TABS tablet should still be taken twice per week as she states that her mom started coughing up blood mucus this morning. >> May 06, 2024 11:10 AM Donna BRAVO wrote: Barnie has not heard anything back from nurse triage  patient did not take (ELIQUIS ) 5 MG TABS tablet this morning due to nose bleed.  Myles Barnie that all of our nurses are busy with other patients at the moment and she should get a call back before end of day today. This is not a medication you can stop. Barnie is wonder if patient can take medication tonight. Patient was informed they will receive a call back before the end of business day.  >> May 06, 2024  9:42 AM Donna BRAVO wrote: Daughter stated was on hold for and is calling back wanting to know if they can cut back on blood thinner medication due to nose bleed, dry sinus  Barnie would like a Nurse triage to call back. [phone]

## 2024-05-07 NOTE — Telephone Encounter (Signed)
 Patient daughter barnie informed.   She did start patient on  humidifier , Ayr nasal gel ,  Saline spray yesterday- as well as stopped the flonase as she felt the patient was using this too much-  and this has helped a lot.

## 2024-05-23 ENCOUNTER — Other Ambulatory Visit: Payer: Self-pay | Admitting: Family Medicine

## 2024-06-24 ENCOUNTER — Other Ambulatory Visit: Payer: Self-pay | Admitting: Family Medicine

## 2024-06-24 DIAGNOSIS — I480 Paroxysmal atrial fibrillation: Secondary | ICD-10-CM

## 2024-06-26 ENCOUNTER — Other Ambulatory Visit: Payer: Self-pay | Admitting: Family Medicine

## 2024-06-26 DIAGNOSIS — I480 Paroxysmal atrial fibrillation: Secondary | ICD-10-CM

## 2024-06-29 ENCOUNTER — Other Ambulatory Visit: Payer: Self-pay | Admitting: Family Medicine

## 2024-06-29 DIAGNOSIS — I480 Paroxysmal atrial fibrillation: Secondary | ICD-10-CM

## 2024-06-30 ENCOUNTER — Other Ambulatory Visit: Payer: Self-pay | Admitting: Family Medicine

## 2024-06-30 DIAGNOSIS — Z742 Need for assistance at home and no other household member able to render care: Secondary | ICD-10-CM

## 2024-06-30 DIAGNOSIS — I480 Paroxysmal atrial fibrillation: Secondary | ICD-10-CM

## 2024-06-30 DIAGNOSIS — Z7409 Other reduced mobility: Secondary | ICD-10-CM

## 2024-06-30 DIAGNOSIS — Z79899 Other long term (current) drug therapy: Secondary | ICD-10-CM

## 2024-06-30 DIAGNOSIS — G8929 Other chronic pain: Secondary | ICD-10-CM

## 2024-06-30 NOTE — Telephone Encounter (Unsigned)
 Copied from CRM [id number]. Topic: Clinical - Medication Refill >> Jun 30, 2024  5:03 PM Everette C wrote: Medication: apixaban  (ELIQUIS ) 2.5 MG TABS tablet [[id number]]  Has the patient contacted their pharmacy? Yes (Agent: If no, request that the patient contact the pharmacy for the refill. If patient does not wish to contact the pharmacy document the reason why and proceed with request.) (Agent: If yes, when and what did the pharmacy advise?)  This is the patient's preferred pharmacy:  [facility] [id number] - [place] - [address] [place] [address] Phone: [phone] Fax: [phone]  Is this the correct pharmacy for this prescription? Yes If no, delete pharmacy and type the correct one.   Has the prescription been filled recently? Yes  Is the patient out of the medication? Yes  Has the patient been seen for an appointment in the last year OR does the patient have an upcoming appointment? Yes  Can we respond through MyChart? No  Agent: Please be advised that Rx refills may take up to 3 business days. We ask that you follow-up with your pharmacy.

## 2024-07-01 MED ORDER — APIXABAN 2.5 MG PO TABS: 2.5000 mg | ORAL_TABLET | Freq: Two times a day (BID) | ORAL | 0 refills | Status: AC

## 2024-07-01 NOTE — Telephone Encounter (Signed)
 Tonette (son) is calling to check status of this refill. He stated that his mother is out of the medication and this is a medication that she can't stop taking. I informed him that the refill request may take up to 3 business days to be processed. I informed him that I will mark as high priority to try and have refill completed today.

## 2024-07-01 NOTE — Telephone Encounter (Signed)
 Requesting rx rf of Eliquis  2.5mg  Last written 05/06/2024 90 day  withone refill but showing as phone in  Last CBC  from dr. Orren showing low platelets Was drawn on 01/09/2024 Am I o.k. to resend this prescription to pharmacy as written?

## 2024-07-02 NOTE — Telephone Encounter (Signed)
 Call patient she needs an appointment I have not seen her in over a year.  I did had did not see an upcoming appointment

## 2024-07-02 NOTE — Telephone Encounter (Signed)
Refill was sent in yesterday

## 2024-07-02 NOTE — Telephone Encounter (Signed)
 Copied from CRM [id number]. Topic: Clinical - Prescription Issue >> Jul 01, 2024  5:21 PM Kevelyn M wrote: Reason for CRM: Patient calling about status of prescription Eliquis  2.5mg /and thought someone from the office called her.

## 2024-07-03 ENCOUNTER — Telehealth: Payer: Self-pay | Admitting: Family Medicine

## 2024-07-03 NOTE — Addendum Note (Signed)
 Addended by: BONNY JON DEL on: 07/03/2024 10:53 AM   Modules accepted: Orders

## 2024-07-03 NOTE — Progress Notes (Signed)
 Complex Care Management Note  Care Guide Note 07/03/2024 Name: Hilliary  NAKEYA ADINOLFI MRN: [id number] DOB: 03-30-1927  Sherrelle  A Brunelle is a 89 y.o. year old female who sees Alvan Dorothyann BIRCH, MD for primary care. I reached out to Twinkle  A Dempsey by phone today to offer complex care management services.  Ms. Rosano was given information about Complex Care Management services today including:   The Complex Care Management services include support from the care team which includes your Nurse Care Manager, Clinical Social Worker, or Pharmacist.  The Complex Care Management team is here to help remove barriers to the health concerns and goals most important to you. Complex Care Management services are voluntary, and the patient may decline or stop services at any time by request to their care team member.   Complex Care Management Consent Status: Patient did not agree to participate in complex care management services at this time.  Follow up plan:  Patient has a caregiver and has declined services at this time.   Encounter Outcome:  Patient Refused  Doyce Razor [facility], [facility] Guide Direct Dial: [phone]  Fax: [phone]

## 2024-07-03 NOTE — Telephone Encounter (Signed)
 Referral sent. Patient is having trouble getting to appointments due to chronic pain in both knees. Her family doesn't want her coming to the office due to flu season. She is in need of a provider that does home visits.

## 2024-07-03 NOTE — Telephone Encounter (Signed)
 Okay, [name] think then Tonya [name] have to figure out a way for her to be able to get care and get labs and be examined periodically especially for a lot of these medications.  Some of them are little safer without routine monitoring but some of them really do have to be monitored.  We can put in a social work consult and have them connect with them it might even be worth her getting a physician who is able to do home visits.  Unfortunately I do not have the ability to do those.

## 2024-07-20 ENCOUNTER — Other Ambulatory Visit: Payer: Self-pay | Admitting: Family Medicine

## 2024-07-20 DIAGNOSIS — I10 Essential (primary) hypertension: Secondary | ICD-10-CM

## 2024-07-22 ENCOUNTER — Other Ambulatory Visit: Payer: Self-pay | Admitting: Family Medicine

## 2025-01-21 ENCOUNTER — Ambulatory Visit
